# Patient Record
Sex: Female | Born: 1990 | Race: White | Hispanic: No | Marital: Single | Smoking: Never smoker
Health system: Southern US, Community
[De-identification: ages and names within clinical notes are randomized; demographics above are authoritative.]

## PROBLEM LIST (undated history)

## (undated) ENCOUNTER — Inpatient Hospital Stay (HOSPITAL_COMMUNITY): Payer: Self-pay

## (undated) DIAGNOSIS — N390 Urinary tract infection, site not specified: Secondary | ICD-10-CM

## (undated) DIAGNOSIS — J45909 Unspecified asthma, uncomplicated: Secondary | ICD-10-CM

## (undated) DIAGNOSIS — K602 Anal fissure, unspecified: Secondary | ICD-10-CM

## (undated) DIAGNOSIS — D649 Anemia, unspecified: Secondary | ICD-10-CM

## (undated) HISTORY — PX: TONSILLECTOMY: SUR1361

## (undated) HISTORY — DX: Anal fissure, unspecified: K60.2

---
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
## 2010-05-13 ENCOUNTER — Emergency Department (HOSPITAL_COMMUNITY)
Admission: EM | Admit: 2010-05-13 | Discharge: 2010-05-14 | Payer: Self-pay | Source: Home / Self Care | Admitting: Emergency Medicine

## 2010-08-18 LAB — URINALYSIS, ROUTINE W REFLEX MICROSCOPIC
Glucose, UA: NEGATIVE mg/dL
Leukocytes, UA: NEGATIVE
Protein, ur: NEGATIVE mg/dL
pH: 6 (ref 5.0–8.0)

## 2010-08-18 LAB — WET PREP, GENITAL

## 2010-08-18 LAB — URINE MICROSCOPIC-ADD ON

## 2010-08-18 LAB — ABO/RH: ABO/RH(D): A POS

## 2010-08-18 LAB — GC/CHLAMYDIA PROBE AMP, GENITAL: GC Probe Amp, Genital: NEGATIVE

## 2010-08-25 LAB — URINE MICROSCOPIC-ADD ON

## 2010-08-25 LAB — PREGNANCY, URINE: Preg Test, Ur: POSITIVE

## 2010-08-25 LAB — URINALYSIS, ROUTINE W REFLEX MICROSCOPIC
Glucose, UA: NEGATIVE mg/dL
Ketones, ur: NEGATIVE mg/dL
Nitrite: NEGATIVE
Specific Gravity, Urine: 1.022 (ref 1.005–1.030)
pH: 7 (ref 5.0–8.0)

## 2011-09-16 ENCOUNTER — Emergency Department (HOSPITAL_COMMUNITY): Payer: BC Managed Care – PPO

## 2011-09-16 ENCOUNTER — Encounter (HOSPITAL_COMMUNITY): Payer: Self-pay | Admitting: *Deleted

## 2011-09-16 ENCOUNTER — Emergency Department (HOSPITAL_COMMUNITY)
Admission: EM | Admit: 2011-09-16 | Discharge: 2011-09-17 | Disposition: A | Discharge status: Home / Self Care | Payer: BC Managed Care – PPO | Attending: Emergency Medicine | Admitting: Emergency Medicine

## 2011-09-16 DIAGNOSIS — N83209 Unspecified ovarian cyst, unspecified side: Secondary | ICD-10-CM | POA: Insufficient documentation

## 2011-09-16 DIAGNOSIS — R1033 Periumbilical pain: Secondary | ICD-10-CM | POA: Insufficient documentation

## 2011-09-16 LAB — CBC
HCT: 33.9 % — ABNORMAL LOW (ref 36.0–46.0)
Hemoglobin: 11.4 g/dL — ABNORMAL LOW (ref 12.0–15.0)
MCH: 29 pg (ref 26.0–34.0)
MCHC: 33.6 g/dL (ref 30.0–36.0)
MCV: 86.3 fL (ref 78.0–100.0)
Platelets: 181 10*3/uL (ref 150–400)
RBC: 3.93 MIL/uL (ref 3.87–5.11)
RDW: 12.7 % (ref 11.5–15.5)
WBC: 6.9 10*3/uL (ref 4.0–10.5)

## 2011-09-16 LAB — URINALYSIS, ROUTINE W REFLEX MICROSCOPIC
Bilirubin Urine: NEGATIVE
Glucose, UA: NEGATIVE mg/dL
Hgb urine dipstick: NEGATIVE
Ketones, ur: NEGATIVE mg/dL
Leukocytes, UA: NEGATIVE
Nitrite: NEGATIVE
Protein, ur: NEGATIVE mg/dL
Specific Gravity, Urine: 1.015 (ref 1.005–1.030)
Urobilinogen, UA: 0.2 mg/dL (ref 0.0–1.0)
pH: 6 (ref 5.0–8.0)

## 2011-09-16 LAB — COMPREHENSIVE METABOLIC PANEL
ALT: 8 U/L (ref 0–35)
AST: 13 U/L (ref 0–37)
Albumin: 3.9 g/dL (ref 3.5–5.2)
Alkaline Phosphatase: 49 U/L (ref 39–117)
BUN: 15 mg/dL (ref 6–23)
CO2: 27 mEq/L (ref 19–32)
Calcium: 9.1 mg/dL (ref 8.4–10.5)
Chloride: 101 mEq/L (ref 96–112)
Creatinine, Ser: 0.63 mg/dL (ref 0.50–1.10)
GFR calc Af Amer: >90 mL/min (ref >90)
GFR calc non Af Amer: >90 mL/min (ref >90)
Glucose, Bld: 85 mg/dL (ref 70–99)
Potassium: 3.7 mEq/L (ref 3.5–5.1)
Sodium: 138 mEq/L (ref 135–145)
Total Bilirubin: 0.3 mg/dL (ref 0.3–1.2)
Total Protein: 7 g/dL (ref 6.0–8.3)

## 2011-09-16 LAB — WET PREP, GENITAL
Trich, Wet Prep: NONE SEEN
Yeast Wet Prep HPF POC: NONE SEEN

## 2011-09-16 LAB — PREGNANCY, URINE: Preg Test, Ur: NEGATIVE

## 2011-09-16 MED ORDER — IOHEXOL 300 MG/ML  SOLN
100.0000 mL | Freq: Once | INTRAMUSCULAR | Status: AC | PRN
  Administered 2011-09-16: 100 mL via INTRAVENOUS

## 2011-09-16 MED ORDER — MORPHINE SULFATE 4 MG/ML IJ SOLN
4.0000 mg | Freq: Once | INTRAMUSCULAR | Status: AC
  Administered 2011-09-16: 4 mg via INTRAVENOUS
  Filled 2011-09-16: qty 1

## 2011-09-16 MED ORDER — MORPHINE SULFATE 4 MG/ML IJ SOLN: 4.0000 mg | Freq: Once | INTRAMUSCULAR | Status: DC

## 2011-09-16 MED ORDER — OXYCODONE-ACETAMINOPHEN 5-325 MG PO TABS: 1.0000 | ORAL_TABLET | ORAL | Status: AC | PRN

## 2011-09-16 MED ORDER — SODIUM CHLORIDE 0.9 % IV BOLUS (SEPSIS)
1000.0000 mL | Freq: Once | INTRAVENOUS | Status: AC
  Administered 2011-09-16: 1000 mL via INTRAVENOUS

## 2011-09-16 MED ORDER — METRONIDAZOLE 500 MG PO TABS: 500.0000 mg | ORAL_TABLET | Freq: Two times a day (BID) | ORAL | Status: AC

## 2011-09-16 MED ORDER — KETOROLAC TROMETHAMINE 15 MG/ML IJ SOLN
15.0000 mg | Freq: Once | INTRAMUSCULAR | Status: AC
  Administered 2011-09-16: 15 mg via INTRAVENOUS
  Filled 2011-09-16: qty 1

## 2011-09-16 MED ORDER — MORPHINE SULFATE 4 MG/ML IJ SOLN
INTRAMUSCULAR | Status: AC
  Administered 2011-09-16: 4 mg via INTRAVENOUS
  Filled 2011-09-16: qty 1

## 2011-09-16 MED ORDER — MORPHINE SULFATE 4 MG/ML IJ SOLN
4.0000 mg | Freq: Once | INTRAMUSCULAR | Status: AC
  Administered 2011-09-16: 4 mg via INTRAVENOUS

## 2011-09-16 NOTE — Discharge Instructions (Signed)
Abdominal Pain Abdominal pain can be caused by many things. Your caregiver decides the seriousness of your pain by an examination and possibly blood tests and X-rays. Many cases can be observed and treated at home. Most abdominal pain is not caused by a disease and will probably improve without treatment. However, in many cases, more time must pass before a clear cause of the pain can be found. Before that point, it may not be known if you need more testing, or if hospitalization or surgery is needed. HOME CARE INSTRUCTIONS   Do not take laxatives unless directed by your caregiver.   Take pain medicine only as directed by your caregiver.   Only take over-the-counter or prescription medicines for pain, discomfort, or fever as directed by your caregiver.   Try a clear liquid diet (broth, tea, or water) for as long as directed by your caregiver. Slowly move to a bland diet as tolerated.  SEEK IMMEDIATE MEDICAL CARE IF:   The pain does not go away.   You have a fever.   You keep throwing up (vomiting).   The pain is felt only in portions of the abdomen. Pain in the right side could possibly be appendicitis. In an adult, pain in the left lower portion of the abdomen could be colitis or diverticulitis.   You pass bloody or black tarry stools.  MAKE SURE YOU:   Understand these instructions.   Will watch your condition.   Will get help right away if you are not doing well or get worse.  Document Released: 03/03/2005 Document Revised: 05/13/2011 Document Reviewed: 01/10/2008 Exit[employer] Patient Information 2012 [employer]  RESOURCE GUIDE  Dental Problems  Patients with Medicaid: [facility]                     [facility] [address]                                           [address] Phone:  [phone]                                                  Phone:  [phone]  If unable to pay or uninsured, contact:  [facility] or [facility] to become qualified for the adult dental clinic.  Chronic Pain Problems Contact  [facility]  [phone] Patients need to be referred by their primary care doctor.  Insufficient Money for Medicine Contact [employer] Way:  call "211" or [facility] 271-5999.  No Primary Care Doctor Call [facility]  [phone] Other agencies that provide inexpensive medical care    [facility]  [phone]    [facility]  [phone]    [facility]  [phone]    Women's Clinic  [phone]    [facility]anned [facility]  [phone]    [facility]  [phone]  Psychological Services [facility]  [phone] [facility]  [phone] [facility]   800 853-5163 (emergency services 641-4993)  Substance Abuse Resources [facility]  336-882-2125 [facility] 336-784-9470 [facility] 336-285-9073 [facility] 336-845-3988 Residential & Outpatient Substance Abuse Program  800-659-3381    Abuse/Neglect [facility] Child Abuse Hotline (785)835-2234 [facility] Child Abuse Hotline 403-344-6948 (After Hours)  Emergency Shelter [facility] 218-213-5203  Maternity Homes [facility] 202 537 9764 [facility] (949)446-2209  MRSA Hotline #:   647-586-0334    [facility] Resources  [facility]     [employer]                          [facility] [address] [place]                       [address]      [address]  Blondell Reveal Phone:  [phone]                                   Phone:  (808) [phone]                 Phone:  661-044-5192  [facility] Phone:  724-120-7796  [facility] Child Abuse Hotline (838) 186-7035 (702)797-5253 (After  Hours)  Ovarian Cyst The ovaries are small organs that are on each side of the uterus. The ovaries are the organs that produce the female hormones, estrogen and progesterone. An ovarian cyst is a sac filled with fluid that can vary in its size. It is normal for a small cyst to form in women who are in the childbearing age and who have menstrual periods. This type of cyst is called a follicle cyst that becomes an ovulation cyst (corpus luteum cyst) after it produces the women's egg. It later goes away on its own if the woman does not become pregnant. There are other kinds of ovarian cysts that may cause problems and may need to be treated. The most serious problem is a cyst with cancer. It should be noted that menopausal women who have an ovarian cyst are at a higher risk of it being a cancer cyst. They should be evaluated very quickly, thoroughly and followed closely. This is especially true in menopausal women because of the high rate of ovarian cancer in women in menopause. CAUSES AND TYPES OF OVARIAN CYSTS:  FUNCTIONAL CYST: The follicle/corpus luteum cyst is a functional cyst that occurs every month during ovulation with the menstrual cycle. They go away with the next menstrual cycle if the woman does not get pregnant. Usually, there are no symptoms with a functional cyst.   ENDOMETRIOMA CYST: This cyst develops from the lining of the uterus tissue. This cyst gets in or on the ovary. It grows every month from the bleeding during the menstrual period. It is also called a "chocolate cyst" because it becomes filled with blood that turns brown. This cyst can cause pain in the lower abdomen during intercourse and with your menstrual period.   CYSTADENOMA CYST: This cyst develops from the cells  on the outside of the ovary. They usually are not cancerous. They can get very big and cause lower abdomen pain and pain with intercourse. This type of cyst can twist on itself, cut off its blood supply and cause  severe pain. It also can easily rupture and cause a lot of pain.   DERMOID CYST: This type of cyst is sometimes found in both ovaries. They are found to have different kinds of body tissue in the cyst. The tissue includes skin, teeth, hair, and/or cartilage. They usually do not have symptoms unless they get very big. Dermoid cysts are rarely cancerous.   POLYCYSTIC OVARY: This is a rare condition with hormone problems that produces many small cysts on both ovaries. The cysts are follicle-like cysts that never produce an egg and become a corpus luteum. It can cause an increase in body weight, infertility, acne, increase in body and facial hair and lack of menstrual periods or rare menstrual periods. Many women with this problem develop type 2 diabetes. The exact cause of this problem is unknown. A polycystic ovary is rarely cancerous.   THECA LUTEIN CYST: Occurs when too much hormone (human chorionic gonadotropin) is produced and over-stimulates the ovaries to produce an egg. They are frequently seen when doctors stimulate the ovaries for invitro-fertilization (test tube babies).   LUTEOMA CYST: This cyst is seen during pregnancy. Rarely it can cause an obstruction to the birth canal during labor and delivery. They usually go away after delivery.  SYMPTOMS   Pelvic pain or pressure.   Pain during sexual intercourse.   Increasing girth (swelling) of the abdomen.   Abnormal menstrual periods.   Increasing pain with menstrual periods.   You stop having menstrual periods and you are not pregnant.  DIAGNOSIS  The diagnosis can be made during:  Routine or annual pelvic examination (common).   Ultrasound.   X-ray of the pelvis.   CT Scan.   MRI.   Blood tests.  TREATMENT   Treatment may only be to follow the cyst monthly for 2 to 3 months with your caregiver. Many go away on their own, especially functional cysts.   May be aspirated (drained) with a long needle with ultrasound, or by  laparoscopy (inserting a tube into the pelvis through a small incision).   The whole cyst can be removed by laparoscopy.   Sometimes the cyst may need to be removed through an incision in the lower abdomen.   Hormone treatment is sometimes used to help dissolve certain cysts.   Birth control pills are sometimes used to help dissolve certain cysts.  HOME CARE INSTRUCTIONS  Follow your caregiver's advice regarding:  Medicine.   Follow up visits to evaluate and treat the cyst.   You may need to come back or make an appointment with another caregiver, to find the exact cause of your cyst, if your caregiver is not a gynecologist.   Get your yearly and recommended pelvic examinations and Pap tests.   Let your caregiver know if you have had an ovarian cyst in the past.  SEEK MEDICAL CARE IF:   Your periods are late, irregular, they stop, or are painful.   Your stomach (abdomen) or pelvic pain does not go away.   Your stomach becomes larger or swollen.   You have pressure on your bladder or trouble emptying your bladder completely.   You have painful sexual intercourse.   You have feelings of fullness, pressure, or discomfort in your stomach.   You  lose weight for no apparent reason.   You feel generally ill.   You become constipated.   You lose your appetite.   You develop acne.   You have an increase in body and facial hair.   You are gaining weight, without changing your exercise and eating habits.   You think you are pregnant.  SEEK IMMEDIATE MEDICAL CARE IF:   You have increasing abdominal pain.   You feel sick to your stomach (nausea) and/or vomit.   You develop a fever that comes on suddenly.   You develop abdominal pain during a bowel movement.   Your menstrual periods become heavier than usual.  Document Released: 05/24/2005 Document Revised: 05/13/2011 Document Reviewed: 03/27/2009 [facility] Patient Information 2012 [place], [place].

## 2011-09-16 NOTE — ED Provider Notes (Signed)
History    21 year old female with abdominal pain. periumbilical. Gradual onset about 2 days ago. Initially intermittent, now more or less constant. Initially noted when she came home from work. No hx of trauma. Pain is achy. Does not radiate. Worse with certain movements, particularly twisting her trunk. No fevers or chills. Nausea and vomiting x1 yesterday. Nonbilious, nonbloody emesis. No urinary complaints. No unusual vaginal bleeding or discharge. . No history of abdominal or pelvic surgery. No sick contacts. Does not think pregnant. No particular concern for possible STD.  CSN: [id number]  Arrival date & time 09/16/11  1505   First MD Initiated Contact with Patient 09/16/11 2007      Chief Complaint  Patient presents with  . Fever  . Abdominal Pain    (Consider location/radiation/quality/duration/timing/severity/associated sxs/prior treatment) HPI  History reviewed. No pertinent past medical history.  History reviewed. No pertinent past surgical history.  No family history on file.  History  Substance Use Topics  . Smoking status: Never Smoker   . Smokeless tobacco: Not on file  . Alcohol Use: Yes    OB History    Grav Para Term Preterm Abortions TAB SAB Ect Mult Living                  Review of Systems   Review of symptoms negative unless otherwise noted in HPI.   Allergies  Review of patient's allergies indicates no known allergies.  Home Medications   Current Outpatient Rx  Name Route Sig Dispense Refill  . IBUPROFEN 200 MG PO TABS Oral Take 400 mg by mouth every 6 (six) hours as needed. Pain.      BP 116/67  Pulse 103  Temp(Src) 98.1 F (36.7 C) (Oral)  Resp 20  Ht 5\' 4"  (1.626 m)  Wt 110 lb (49.896 kg)  BMI 18.88 kg/m2  SpO2 100%  LMP 08/25/2011  Physical Exam  Nursing note and vitals reviewed. Constitutional: She appears well-developed and well-nourished. No distress.       Sitting in bed. No acute distress.  HENT:  Head: Normocephalic  and atraumatic.  Eyes: Conjunctivae are normal. Pupils are equal, round, and reactive to light. Right eye exhibits no discharge. Left eye exhibits no discharge.  Neck: Neck supple.  Cardiovascular: Normal rate, regular rhythm and normal heart sounds.  Exam reveals no gallop and no friction rub.   No murmur heard. Pulmonary/Chest: Effort normal and breath sounds normal. No respiratory distress.  Abdominal: Soft. She exhibits no distension and no mass. There is tenderness. There is no rebound and no guarding.       Mild periumbilical tenderness without rebound or guarding.   Genitourinary:       No cva tenderness. External female genitalia. No bleeding or significant discharge noted. Cervix closed and no lesions noted. No cervical motion tenderness. Mild right-sided adnexal tenderness.  Musculoskeletal: She exhibits no edema and no tenderness.  Neurological: She is alert.  Skin: Skin is warm and dry. She is not diaphoretic.  Psychiatric: She has a normal mood and affect. Her behavior is normal. Thought content normal.    ED Course  Procedures (including critical care time)  Labs Reviewed  URINALYSIS, ROUTINE W REFLEX MICROSCOPIC - Abnormal; Notable for the following:    APPearance CLOUDY (*)    All other components within normal limits  CBC - Abnormal; Notable for the following:    Hemoglobin 11.4 (*)    HCT 33.9 (*)    All other components within normal limits  PREGNANCY, URINE  COMPREHENSIVE METABOLIC PANEL  GC/CHLAMYDIA PROBE AMP, GENITAL  WET PREP, GENITAL   Ct Abdomen Pelvis W Contrast  09/16/2011  *RADIOLOGY REPORT*  Clinical Data: Abdominal pain since yesterday.  CT ABDOMEN AND PELVIS WITH CONTRAST  Technique:  Multidetector CT imaging of the abdomen and pelvis was performed following the standard protocol during bolus administration of intravenous contrast.  Contrast: OMNIPAQUE IOHEXOL 300 MG/ML  SOLN  Comparison: None.  Findings: Moderate amount of dense fluid suggesting  blood within the pelvis with small amount of Morison's pouch and perihepatic region.  Abnormal appearance of the uterus with possible injury posterior wall such as perforation causing the moderate amount of surrounding hemorrhage.  Right adnexal complex septated cystic structure measures 9 x 4.8 x 5 cm will require follow-up.  Ruptured hemorrhagic cyst or adnexal process could conceivably cause the blood within the pelvis although primary uterine abnormality is of primary consideration.  The appendix is located posteriorly and directed superiorly and does not appear to be inflamed.  No abnormality involving the liver, spleen, pancreas, kidneys or adrenal glands.  No calcified gallstones.  No free intraperitoneal air.  Retroaortic left renal vein.  Urinary bladder appears intact.  IMPRESSION: Moderate amount of dense fluid suggesting blood within the pelvis with small amount of Morison's pouch and perihepatic region.  Abnormal appearance of the uterus with possible injury posterior wall such as perforation causing the moderate amount of surrounding hemorrhage.  Right adnexal complex septated cystic structure measures 9 x 4.8 x 5 cm will require follow-up.  Ruptured hemorrhagic cyst or adnexal process could conceivably cause the blood within the pelvis although primary uterine abnormality is of primary consideration.  Critical Value/emergent results were called by telephone at the time of interpretation on 09/16/2011  at 10/30  p.m.  to  Dr. Juleen China, who verbally acknowledged these results.  Original Report Authenticated By: Fuller Canada, M.D.     1. Abdominal pain   2. Ovarian cyst     10:50 PM Pt reassessed. No new complaints. Reports pain improved but still has some. No change in character or location. Updated on results including CT and need for pelvic exam.   MDM  20yF with abdominal pain. Suspect likely secondary to R adnexal cyst. Discussed with radiology the possibility of uterine rupture.  Considered but clinically doubt. Gradual onset of pain which initially intermittent. Not associated with intercourse or other potential trauma. No vaginal bleeding. No peritoneal signs on abdominal exam and not overly concerning pelvic exam.  Minimally anemic. Normotensive prior to start of IVF. Mild tachycardia but could be because of pain. Improved. Repeat BP noted, but not particularly concerning given pt's young age and no reflex tachycardia as would expect if from volume loss. Consider TOA given subjective fever, but less likely. No leukocytosis and afebrile. Only mild adnexal tenderness. Repeat abdominal exam prior to DC unchanged. Discussed case with Dr Vincente Poli, GYN. Close outpt f/u. Strict return precautions discussed. Clue cells on wet prep. Flagyl for BV.        Raeford Razor, MD 09/17/11 [phone]

## 2011-09-16 NOTE — ED Notes (Signed)
MD aware of bp prior to pain meds ordered.  OK to continue with meds.

## 2011-09-16 NOTE — ED Notes (Signed)
Pt states she started to have abdominal pain. Pt states she started to feel nauseated yesterday and had emesis x1 this morning .

## 2011-09-17 LAB — GC/CHLAMYDIA PROBE AMP, GENITAL
Chlamydia, DNA Probe: NEGATIVE
GC Probe Amp, Genital: NEGATIVE

## 2011-12-16 ENCOUNTER — Other Ambulatory Visit (HOSPITAL_COMMUNITY)
Admission: RE | Admit: 2011-12-16 | Discharge: 2011-12-16 | Disposition: A | Discharge status: Home / Self Care | Payer: BC Managed Care – PPO | Source: Ambulatory Visit | Attending: Obstetrics and Gynecology | Admitting: Obstetrics and Gynecology

## 2011-12-16 ENCOUNTER — Other Ambulatory Visit: Payer: Self-pay | Admitting: Adult Health

## 2011-12-16 DIAGNOSIS — Z01419 Encounter for gynecological examination (general) (routine) without abnormal findings: Secondary | ICD-10-CM | POA: Insufficient documentation

## 2011-12-16 DIAGNOSIS — Z113 Encounter for screening for infections with a predominantly sexual mode of transmission: Secondary | ICD-10-CM | POA: Insufficient documentation

## 2012-03-15 ENCOUNTER — Encounter (HOSPITAL_COMMUNITY): Payer: Self-pay

## 2012-03-15 ENCOUNTER — Emergency Department (HOSPITAL_COMMUNITY)
Admission: EM | Admit: 2012-03-15 | Discharge: 2012-03-15 | Disposition: A | Discharge status: Home / Self Care | Payer: BC Managed Care – PPO | Attending: Emergency Medicine | Admitting: Emergency Medicine

## 2012-03-15 DIAGNOSIS — N390 Urinary tract infection, site not specified: Secondary | ICD-10-CM | POA: Insufficient documentation

## 2012-03-15 DIAGNOSIS — J45909 Unspecified asthma, uncomplicated: Secondary | ICD-10-CM | POA: Insufficient documentation

## 2012-03-15 DIAGNOSIS — Z91018 Allergy to other foods: Secondary | ICD-10-CM | POA: Insufficient documentation

## 2012-03-15 HISTORY — DX: Unspecified asthma, uncomplicated: J45.909

## 2012-03-15 HISTORY — DX: Urinary tract infection, site not specified: N39.0

## 2012-03-15 LAB — URINALYSIS, ROUTINE W REFLEX MICROSCOPIC
Bilirubin Urine: NEGATIVE
Hgb urine dipstick: NEGATIVE
Ketones, ur: NEGATIVE mg/dL
Protein, ur: NEGATIVE mg/dL
Specific Gravity, Urine: 1.019 (ref 1.005–1.030)
Urobilinogen, UA: 0.2 mg/dL (ref 0.0–1.0)

## 2012-03-15 LAB — URINE MICROSCOPIC-ADD ON

## 2012-03-15 LAB — GLUCOSE, CAPILLARY: Glucose-Capillary: 113 mg/dL — ABNORMAL HIGH (ref 70–99)

## 2012-03-15 MED ORDER — SULFAMETHOXAZOLE-TRIMETHOPRIM 800-160 MG PO TABS: 1.0000 | ORAL_TABLET | Freq: Two times a day (BID) | ORAL | Status: DC

## 2012-03-15 NOTE — ED Provider Notes (Signed)
Medical screening examination/treatment/procedure(s) were performed by non-physician practitioner and as supervising physician I was immediately available for consultation/collaboration.   Shelda Jakes, MD 03/15/12 [phone]

## 2012-03-15 NOTE — ED Notes (Signed)
Pt presents with NAD- Denies N/V/D and fever- only urinary frequency

## 2012-03-15 NOTE — ED Provider Notes (Signed)
History     CSN: [id number]  Arrival date & time 03/15/12  1517   First MD Initiated Contact with Patient 03/15/12 1706      Chief Complaint  Patient presents with  . Urinary Frequency    (Consider location/radiation/quality/duration/timing/severity/associated sxs/prior treatment) Patient is a 21 y.o. female presenting with frequency.  Urinary Frequency This is a new problem. The current episode started yesterday. The problem occurs constantly. The problem has been gradually worsening. Associated symptoms include urinary symptoms. Pertinent negatives include no abdominal pain, anorexia, arthralgias, change in bowel habit, chest pain, chills, congestion, coughing, diaphoresis, fatigue, fever, headaches, joint swelling, myalgias, nausea, neck pain, numbness, rash, sore throat, swollen glands, vertigo, visual change, vomiting or weakness.    Past Medical History  Diagnosis Date  . Asthma   . UTI (lower urinary tract infection)     History reviewed. No pertinent past surgical history.  No family history on file.  History  Substance Use Topics  . Smoking status: Never Smoker   . Smokeless tobacco: Not on file  . Alcohol Use: Yes    OB History    Grav Para Term Preterm Abortions TAB SAB Ect Mult Living                  Review of Systems  Constitutional: Negative for fever, chills, diaphoresis and fatigue.  HENT: Negative for congestion, sore throat and neck pain.   Respiratory: Negative for cough.   Cardiovascular: Negative for chest pain.  Gastrointestinal: Negative for nausea, vomiting, abdominal pain, anorexia and change in bowel habit.  Genitourinary: Positive for frequency. Negative for dysuria, urgency, flank pain, decreased urine volume, vaginal bleeding, vaginal discharge, difficulty urinating and vaginal pain.  Musculoskeletal: Negative for myalgias, back pain, joint swelling and arthralgias.  Skin: Negative for rash.  Neurological: Negative for vertigo,  weakness, numbness and headaches.  All other systems reviewed and are negative.    Allergies  Red dye  Home Medications  No current outpatient prescriptions on file.  BP 98/52  Pulse 83  Temp 98.2 F (36.8 C) (Oral)  Resp 16  Ht 5\' 5"  (1.651 m)  Wt 120 lb (54.432 kg)  BMI 19.97 kg/m2  SpO2 100%  LMP 02/29/2012  Physical Exam  Nursing note and vitals reviewed. Constitutional: She is oriented to person, place, and time. She appears well-developed and well-nourished. No distress.  HENT:  Head: Normocephalic and atraumatic.  Eyes: Conjunctivae normal and EOM are normal.  Neck: Normal range of motion.  Pulmonary/Chest: Effort normal.  Abdominal:       Soft non tender abdomen  Genitourinary:       No flank pain  Musculoskeletal: Normal range of motion.  Neurological: She is alert and oriented to person, place, and time.  Skin: Skin is warm and dry. No rash noted. She is not diaphoretic.  Psychiatric: She has a normal mood and affect. Her behavior is normal.    ED Course  Procedures (including critical care time)  Labs Reviewed  URINALYSIS, ROUTINE W REFLEX MICROSCOPIC - Abnormal; Notable for the following:    Leukocytes, UA SMALL (*)     All other components within normal limits  GLUCOSE, CAPILLARY - Abnormal; Notable for the following:    Glucose-Capillary 113 (*)     All other components within normal limits  URINE MICROSCOPIC-ADD ON - Abnormal; Notable for the following:    Bacteria, UA FEW (*)     All other components within normal limits  PREGNANCY, URINE   No  results found.   No diagnosis found.    MDM  UTI  Pt has been diagnosed with a UTI. Pt is afebrile, no CVA tenderness, normotensive, and denies N/V. Pt to be dc home with antibiotics and instructions to follow up with PCP if symptoms persist.         Jaci Carrel, PA-C 03/15/12 1728

## 2012-06-08 ENCOUNTER — Emergency Department (HOSPITAL_COMMUNITY)
Admission: EM | Admit: 2012-06-08 | Discharge: 2012-06-08 | Disposition: A | Discharge status: Home / Self Care | Payer: BC Managed Care – PPO | Attending: Emergency Medicine | Admitting: Emergency Medicine

## 2012-06-08 ENCOUNTER — Encounter (HOSPITAL_COMMUNITY): Payer: Self-pay | Admitting: Cardiology

## 2012-06-08 DIAGNOSIS — Z87440 Personal history of urinary (tract) infections: Secondary | ICD-10-CM | POA: Insufficient documentation

## 2012-06-08 DIAGNOSIS — Y9389 Activity, other specified: Secondary | ICD-10-CM | POA: Insufficient documentation

## 2012-06-08 DIAGNOSIS — S139XXA Sprain of joints and ligaments of unspecified parts of neck, initial encounter: Secondary | ICD-10-CM | POA: Insufficient documentation

## 2012-06-08 DIAGNOSIS — Y9289 Other specified places as the place of occurrence of the external cause: Secondary | ICD-10-CM | POA: Insufficient documentation

## 2012-06-08 DIAGNOSIS — S298XXA Other specified injuries of thorax, initial encounter: Secondary | ICD-10-CM | POA: Insufficient documentation

## 2012-06-08 DIAGNOSIS — S0990XA Unspecified injury of head, initial encounter: Secondary | ICD-10-CM | POA: Insufficient documentation

## 2012-06-08 DIAGNOSIS — J45909 Unspecified asthma, uncomplicated: Secondary | ICD-10-CM | POA: Insufficient documentation

## 2012-06-08 DIAGNOSIS — S161XXA Strain of muscle, fascia and tendon at neck level, initial encounter: Secondary | ICD-10-CM

## 2012-06-08 MED ORDER — NAPROXEN 500 MG PO TABS: 500.0000 mg | ORAL_TABLET | Freq: Two times a day (BID) | ORAL | Status: DC
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
## 2012-06-08 NOTE — Progress Notes (Signed)
Orthopedic Tech Progress Note Patient Details:  Kimberly Fields 10-10-1990 [id number]  Ortho Devices Type of Ortho Device: Soft collar Ortho Device/Splint Location: neck Ortho Device/Splint Interventions: Application   Jennye Moccasin 06/08/2012, 7:08 PM
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
## 2012-06-09 NOTE — ED Provider Notes (Signed)
Medical screening examination/treatment/procedure(s) were performed by non-physician practitioner and as supervising physician I was immediately available for consultation/collaboration.   Dione Booze, MD 06/09/12 [phone]

## 2012-09-18 ENCOUNTER — Encounter: Payer: Self-pay | Admitting: *Deleted

## 2012-09-19 ENCOUNTER — Encounter: Payer: Self-pay | Admitting: Obstetrics & Gynecology

## 2012-09-19 ENCOUNTER — Ambulatory Visit (INDEPENDENT_AMBULATORY_CARE_PROVIDER_SITE_OTHER): Payer: BC Managed Care – PPO | Admitting: Obstetrics & Gynecology

## 2012-09-19 VITALS — BP 110/80 | Ht 65.0 in | Wt 137.0 lb

## 2012-09-19 DIAGNOSIS — Z1389 Encounter for screening for other disorder: Secondary | ICD-10-CM

## 2012-09-19 DIAGNOSIS — R350 Frequency of micturition: Secondary | ICD-10-CM

## 2012-09-19 LAB — POCT URINALYSIS DIPSTICK
Leukocytes, UA: NEGATIVE
Nitrite, UA: NEGATIVE
Protein, UA: NEGATIVE

## 2012-09-19 MED ORDER — NITROFURANTOIN MONOHYD MACRO 100 MG PO CAPS: 100.0000 mg | ORAL_CAPSULE | Freq: Two times a day (BID) | ORAL | Status: DC

## 2012-09-19 NOTE — Patient Instructions (Signed)

## 2012-09-19 NOTE — Progress Notes (Signed)
Patient ID: Kimberly Fields, female   DOB: 28-May-1991, 22 y.o.   MRN: [id number] Patient with 3 days of urinary frequency, feels like you have to go all the time.  No dysuria Urinalysis is negative. Has history of multiple episodes in past all of which reveal normal UAs and no cultures done  I am suspicious for Interstitial cystitis  Culture urine Macrobid 100 bid x 7 days Follow up in 10 days

## 2012-09-19 NOTE — Addendum Note (Signed)
Addended by: Criss Alvine on: 09/19/2012 11:24 AM   Modules accepted: Orders

## 2012-09-21 LAB — URINE CULTURE: Colony Count: 60000

## 2012-09-28 ENCOUNTER — Ambulatory Visit: Payer: BC Managed Care – PPO | Admitting: Obstetrics & Gynecology

## 2013-02-15 ENCOUNTER — Encounter (HOSPITAL_COMMUNITY): Payer: Self-pay | Admitting: Emergency Medicine

## 2013-02-15 ENCOUNTER — Emergency Department (HOSPITAL_COMMUNITY)
Admission: EM | Admit: 2013-02-15 | Discharge: 2013-02-15 | Disposition: A | Discharge status: Home / Self Care | Payer: BC Managed Care – PPO | Attending: Emergency Medicine | Admitting: Emergency Medicine

## 2013-02-15 DIAGNOSIS — R209 Unspecified disturbances of skin sensation: Secondary | ICD-10-CM | POA: Insufficient documentation

## 2013-02-15 DIAGNOSIS — J45909 Unspecified asthma, uncomplicated: Secondary | ICD-10-CM | POA: Insufficient documentation

## 2013-02-15 DIAGNOSIS — Z87440 Personal history of urinary (tract) infections: Secondary | ICD-10-CM | POA: Insufficient documentation

## 2013-02-15 DIAGNOSIS — B0089 Other herpesviral infection: Secondary | ICD-10-CM | POA: Insufficient documentation

## 2013-02-15 DIAGNOSIS — B001 Herpesviral vesicular dermatitis: Secondary | ICD-10-CM

## 2013-02-15 DIAGNOSIS — K1370 Unspecified lesions of oral mucosa: Secondary | ICD-10-CM | POA: Insufficient documentation

## 2013-02-15 MED ORDER — VALACYCLOVIR HCL 1 G PO TABS: 2000.0000 mg | ORAL_TABLET | Freq: Two times a day (BID) | ORAL | Status: AC

## 2013-02-15 NOTE — ED Provider Notes (Signed)
Medical screening examination/treatment/procedure(s) were performed by non-physician practitioner and as supervising physician I was immediately available for consultation/collaboration.    Celene Kras, MD 02/15/13 [phone]

## 2013-02-15 NOTE — ED Notes (Signed)
Patient to   ED with C/O her lip swelling.  Onset was yesterday.  Left upper lip noted to be swollen with crusting noted laterally.

## 2013-02-15 NOTE — ED Notes (Signed)
Pt states that she had same reaction to same chapstick before but not this bad upper lip swelling since yesterday got worse this am when she woke up. Pt having no diff breathing and is handling all secreations well w/o diff

## 2013-02-15 NOTE — ED Provider Notes (Signed)
CSN: [id number]     Arrival date & time 02/15/13  1253 History   First MD Initiated Contact with Patient 02/15/13 1508     Chief Complaint  Patient presents with  . Oral Swelling   (Consider location/radiation/quality/duration/timing/severity/associated sxs/prior Treatment) HPI Comments: Patient presents with complaint of left upper lip swelling since yesterday. Patient had a similar reaction a long time ago with application of chapstik. Patient noted tingling prior to the swelling. No tongue swelling, trouble breathing, rash. No history of anaphylaxis or allergic reaction. No treatments prior to arrival. Onset of symptoms gradual. Course is gradually worsening. Nothing makes symptoms better or worse.  The history is provided by the patient.    Past Medical History  Diagnosis Date  . Asthma   . UTI (lower urinary tract infection)    Past Surgical History  Procedure Laterality Date  . Tonsillectomy     Family History  Problem Relation Age of Onset  . Migraines Mother   . Asthma Mother   . Cancer Mother     cervical   . COPD Maternal Grandmother   . Cancer Maternal Grandmother     colon  . Anxiety disorder Maternal Grandmother   . Coronary artery disease Maternal Grandmother   . Cancer Maternal Grandfather     skin  . Coronary artery disease Maternal Grandfather   . Stroke Maternal Grandfather   . Tuberculosis Maternal Grandfather   . Heart attack Paternal Grandfather    History  Substance Use Topics  . Smoking status: Never Smoker   . Smokeless tobacco: Never Used  . Alcohol Use: No   OB History   Grav Para Term Preterm Abortions TAB SAB Ect Mult Living   3    3  2         Review of Systems  Constitutional: Negative for fever.  HENT: Positive for facial swelling (Left upper lip). Negative for sore throat and rhinorrhea.   Eyes: Negative for redness.  Respiratory: Negative for cough.   Cardiovascular: Negative for chest pain.  Gastrointestinal: Negative for  nausea, vomiting, abdominal pain and diarrhea.  Genitourinary: Negative for dysuria.  Musculoskeletal: Negative for myalgias.  Skin: Negative for rash.  Neurological: Negative for headaches.    Allergies  Red dye  Home Medications  No current outpatient prescriptions on file. BP 96/72  Pulse 73  Temp(Src) 98.1 F (36.7 C) (Oral)  Resp 16  SpO2 99% Physical Exam  Nursing note and vitals reviewed. Constitutional: She appears well-developed and well-nourished.  HENT:  Head: Normocephalic and atraumatic.  Mouth/Throat: Oral lesions (Left upper lip, vesicular lesions consistent with herpes simplex) present.  Eyes: Conjunctivae are normal.  Neck: Normal range of motion. Neck supple.  Pulmonary/Chest: No respiratory distress.  Neurological: She is alert.  Skin: Skin is warm and dry.  Psychiatric: She has a normal mood and affect.    ED Course  Procedures (including critical care time) Labs Review Labs Reviewed - No data to display Imaging Review No results found.  3:38 PM Patient seen and examined. Rx for Valtrex given. Pt counseled.   Vital signs reviewed and are as follows: Filed Vitals:   02/15/13 1255  BP: 96/72  Pulse: 73  Temp: 98.1 F (36.7 C)  Resp: 16      MDM   1. Herpes simplex labialis    Patient with oral HSV infection. No contraindications to anti-viral.     Renne Crigler, PA-C 02/15/13 1544

## 2013-02-15 NOTE — ED Notes (Signed)
Pt moved to acute bed per request of Tiffany, PA.

## 2013-02-15 NOTE — ED Notes (Signed)
Upper lip swelling since using Blistex lip balm. States has had this reaction to it before. Upper lip swollen with chancre-type sore on upper left.

## 2014-02-20 ENCOUNTER — Ambulatory Visit (INDEPENDENT_AMBULATORY_CARE_PROVIDER_SITE_OTHER): Payer: BC Managed Care – PPO | Admitting: Adult Health

## 2014-02-20 ENCOUNTER — Encounter: Payer: Self-pay | Admitting: Adult Health

## 2014-02-20 DIAGNOSIS — Z3201 Encounter for pregnancy test, result positive: Secondary | ICD-10-CM

## 2014-02-20 LAB — POCT URINE PREGNANCY: PREG TEST UR: POSITIVE

## 2014-02-20 NOTE — Progress Notes (Signed)
Pt here for pregnancy test. Positive result. Pt has had some stomach pain, but has a BM and pain goes away. Pt reports no spotting. Advised spotting and cramping can be normal in early pregnancy, that's just everything getting settled into place. Advised if she notices either, push fluids, take it easy, and call office. Pt voiced understanding. JSY

## 2014-03-04 ENCOUNTER — Other Ambulatory Visit: Payer: Self-pay | Admitting: Obstetrics and Gynecology

## 2014-03-04 DIAGNOSIS — O3680X0 Pregnancy with inconclusive fetal viability, not applicable or unspecified: Secondary | ICD-10-CM

## 2014-03-07 ENCOUNTER — Other Ambulatory Visit: Payer: Self-pay | Admitting: Obstetrics and Gynecology

## 2014-03-07 ENCOUNTER — Ambulatory Visit (INDEPENDENT_AMBULATORY_CARE_PROVIDER_SITE_OTHER): Payer: BC Managed Care – PPO

## 2014-03-07 DIAGNOSIS — O2621 Pregnancy care for patient with recurrent pregnancy loss, first trimester: Secondary | ICD-10-CM

## 2014-03-07 DIAGNOSIS — O3680X0 Pregnancy with inconclusive fetal viability, not applicable or unspecified: Secondary | ICD-10-CM

## 2014-03-07 NOTE — Progress Notes (Signed)
U/S(7+1wks)-single IUP with +FCA noted,FHR-154 bpm, cx appears closed, bilateral adnexa appears WNL with C.L. Noted on Rt, CRL c/w LMP dates, no free fluid noted within the pelvis

## 2014-03-18 ENCOUNTER — Encounter: Payer: BC Managed Care – PPO | Admitting: Women's Health

## 2014-04-08 ENCOUNTER — Encounter: Payer: Self-pay | Admitting: Women's Health

## 2014-04-08 ENCOUNTER — Ambulatory Visit (INDEPENDENT_AMBULATORY_CARE_PROVIDER_SITE_OTHER): Payer: BC Managed Care – PPO | Admitting: Women's Health

## 2014-04-08 ENCOUNTER — Other Ambulatory Visit (HOSPITAL_COMMUNITY)
Admission: RE | Admit: 2014-04-08 | Discharge: 2014-04-08 | Disposition: A | Discharge status: Home / Self Care | Payer: BC Managed Care – PPO | Source: Ambulatory Visit | Attending: Obstetrics & Gynecology | Admitting: Obstetrics & Gynecology

## 2014-04-08 VITALS — BP 108/56 | Wt 125.0 lb

## 2014-04-08 DIAGNOSIS — Z113 Encounter for screening for infections with a predominantly sexual mode of transmission: Secondary | ICD-10-CM | POA: Insufficient documentation

## 2014-04-08 DIAGNOSIS — Z0184 Encounter for antibody response examination: Secondary | ICD-10-CM

## 2014-04-08 DIAGNOSIS — Z0283 Encounter for blood-alcohol and blood-drug test: Secondary | ICD-10-CM

## 2014-04-08 DIAGNOSIS — Z3490 Encounter for supervision of normal pregnancy, unspecified, unspecified trimester: Secondary | ICD-10-CM | POA: Insufficient documentation

## 2014-04-08 DIAGNOSIS — Z114 Encounter for screening for human immunodeficiency virus [HIV]: Secondary | ICD-10-CM

## 2014-04-08 DIAGNOSIS — Z01419 Encounter for gynecological examination (general) (routine) without abnormal findings: Secondary | ICD-10-CM | POA: Diagnosis not present

## 2014-04-08 DIAGNOSIS — Z3682 Encounter for antenatal screening for nuchal translucency: Secondary | ICD-10-CM

## 2014-04-08 DIAGNOSIS — Z331 Pregnant state, incidental: Secondary | ICD-10-CM

## 2014-04-08 DIAGNOSIS — Z124 Encounter for screening for malignant neoplasm of cervix: Secondary | ICD-10-CM

## 2014-04-08 DIAGNOSIS — Z1389 Encounter for screening for other disorder: Secondary | ICD-10-CM

## 2014-04-08 DIAGNOSIS — Z3401 Encounter for supervision of normal first pregnancy, first trimester: Secondary | ICD-10-CM

## 2014-04-08 DIAGNOSIS — Z3491 Encounter for supervision of normal pregnancy, unspecified, first trimester: Secondary | ICD-10-CM

## 2014-04-08 LAB — POCT URINALYSIS DIPSTICK
Glucose, UA: NEGATIVE
Ketones, UA: NEGATIVE
LEUKOCYTES UA: NEGATIVE
NITRITE UA: NEGATIVE
PROTEIN UA: NEGATIVE
RBC UA: NEGATIVE

## 2014-04-08 NOTE — Patient Instructions (Signed)

## 2014-04-08 NOTE — Progress Notes (Signed)
  Subjective:  Kimberly Fields is a 23 y.o. 234P0030 Caucasian female at 8319w5d by LMP c/w 6wk u/s, being seen today for her first obstetrical visit.  Her obstetrical history is significant for TAB x 1, SAB x 2.  Pregnancy history fully reviewed.  Patient reports no complaints. Denies vb, cramping, uti s/s, abnormal/malodorous vag d/c, or vulvovaginal itching/irritation.  BP 108/56 mmHg  Wt 125 lb (56.7 kg)  LMP 01/16/2014  HISTORY: OB History  Gravida Para Term Preterm AB SAB TAB Ectopic Multiple Living  4 0 0 0 3 2 1 0 0 0     # Outcome Date GA Lbr Len/2nd Weight Sex Delivery Anes PTL Lv  4 Current           3 SAB 2013 9619w0d         2 SAB 2012          1 TAB 2011             Past Medical History  Diagnosis Date  . Asthma   . UTI (lower urinary tract infection)    Past Surgical History  Procedure Laterality Date  . Tonsillectomy     Family History  Problem Relation Age of Onset  . Migraines Mother   . Asthma Mother   . Cancer Mother     cervical   . COPD Maternal Grandmother   . Cancer Maternal Grandmother     colon  . Anxiety disorder Maternal Grandmother   . Coronary artery disease Maternal Grandmother   . Cancer Maternal Grandfather     skin  . Coronary artery disease Maternal Grandfather   . Stroke Maternal Grandfather   . Tuberculosis Maternal Grandfather   . Heart attack Paternal Grandfather     Exam   System:     General: Well developed & nourished, no acute distress   Skin: Warm & dry, normal coloration and turgor, no rashes   Neurologic: Alert & oriented, normal mood   Cardiovascular: Regular rate & rhythm   Respiratory: Effort & rate normal, LCTAB, acyanotic   Abdomen: Soft, non tender   Extremities: normal strength, tone   Pelvic Exam:    Perineum: Normal perineum   Vulva: Normal, no lesions   Vagina:  Normal mucosa, normal discharge   Cervix: Normal, bulbous, appears closed   Uterus: Normal size/shape/contour for GA   Thin prep pap smear  obtained w/ reflex high risk HPV cotesting FHR: 170 via u/s   Assessment:   Pregnancy: G4P0030 Patient Active Problem List   Diagnosis Date Noted  . Supervision of normal pregnancy 04/08/2014    Priority: High    7919w5d G4P0030 New OB visit H/O 2 SABs   Plan:  Initial labs drawn Continue prenatal vitamins Problem list reviewed and updated Reviewed n/v relief measures and warning s/s to report Reviewed recommended weight gain based on pre-gravid BMI Encouraged well-balanced diet Genetic Screening discussed Integrated Screen: requested Cystic fibrosis screening discussed declined Ultrasound discussed; fetal survey: requested Follow up in 1 weeks for 1st it/nt (no visit) then 4wks for visit and 2nd IT CCNC completed  Marge DuncansBooker, Lamine Laton Randall CNM, [facility]WHNP-BC 04/08/2014 3:01 PM

## 2014-04-09 LAB — CBC
HCT: 33 % — ABNORMAL LOW (ref 36.0–46.0)
Hemoglobin: 11.4 g/dL — ABNORMAL LOW (ref 12.0–15.0)
MCH: 29.1 pg (ref 26.0–34.0)
MCHC: 34.5 g/dL (ref 30.0–36.0)
MCV: 84.2 fL (ref 78.0–100.0)
PLATELETS: 239 10*3/uL (ref 150–400)
RBC: 3.92 MIL/uL (ref 3.87–5.11)
RDW: 13.8 % (ref 11.5–15.5)
WBC: 7.4 10*3/uL (ref 4.0–10.5)

## 2014-04-09 LAB — ANTIBODY SCREEN: Antibody Screen: NEGATIVE

## 2014-04-09 LAB — DRUG SCREEN, URINE, NO CONFIRMATION
AMPHETAMINE SCRN UR: NEGATIVE
BENZODIAZEPINES.: NEGATIVE
Barbiturate Quant, Ur: NEGATIVE
COCAINE METABOLITES: NEGATIVE
Creatinine,U: 150.5 mg/dL
MARIJUANA METABOLITE: NEGATIVE
Methadone: NEGATIVE
OPIATE SCREEN, URINE: NEGATIVE
PHENCYCLIDINE (PCP): NEGATIVE
PROPOXYPHENE: NEGATIVE

## 2014-04-09 LAB — URINALYSIS, ROUTINE W REFLEX MICROSCOPIC
BILIRUBIN URINE: NEGATIVE
GLUCOSE, UA: NEGATIVE mg/dL
Hgb urine dipstick: NEGATIVE
Ketones, ur: NEGATIVE mg/dL
LEUKOCYTES UA: NEGATIVE
Nitrite: NEGATIVE
PROTEIN: NEGATIVE mg/dL
SPECIFIC GRAVITY, URINE: 1.022 (ref 1.005–1.030)
Urobilinogen, UA: 0.2 mg/dL (ref 0.0–1.0)
pH: 6.5 (ref 5.0–8.0)

## 2014-04-09 LAB — RPR

## 2014-04-09 LAB — OXYCODONE SCREEN, UA, RFLX CONFIRM: Oxycodone Screen, Ur: NEGATIVE ng/mL

## 2014-04-09 LAB — ABO AND RH: Rh Type: POSITIVE

## 2014-04-09 LAB — RUBELLA SCREEN: Rubella: 1.14 Index — ABNORMAL HIGH (ref <0.90)

## 2014-04-09 LAB — HIV ANTIBODY (ROUTINE TESTING W REFLEX): HIV 1&2 Ab, 4th Generation: NONREACTIVE

## 2014-04-09 LAB — HEPATITIS B SURFACE ANTIGEN: Hepatitis B Surface Ag: NEGATIVE

## 2014-04-09 LAB — VARICELLA ZOSTER ANTIBODY, IGG: VARICELLA IGG: 899.3 {index} — AB (ref <135.00)

## 2014-04-10 LAB — URINE CULTURE
COLONY COUNT: NO GROWTH
ORGANISM ID, BACTERIA: NO GROWTH

## 2014-04-10 LAB — CYTOLOGY - PAP

## 2014-04-15 ENCOUNTER — Other Ambulatory Visit: Payer: BC Managed Care – PPO

## 2014-04-23 ENCOUNTER — Other Ambulatory Visit: Payer: BC Managed Care – PPO

## 2014-04-23 ENCOUNTER — Ambulatory Visit (INDEPENDENT_AMBULATORY_CARE_PROVIDER_SITE_OTHER): Payer: BC Managed Care – PPO

## 2014-04-23 DIAGNOSIS — Z3682 Encounter for antenatal screening for nuchal translucency: Secondary | ICD-10-CM

## 2014-04-23 DIAGNOSIS — Z331 Pregnant state, incidental: Secondary | ICD-10-CM

## 2014-04-23 DIAGNOSIS — Z36 Encounter for antenatal screening of mother: Secondary | ICD-10-CM

## 2014-04-23 DIAGNOSIS — O09291 Supervision of pregnancy with other poor reproductive or obstetric history, first trimester: Secondary | ICD-10-CM

## 2014-04-23 NOTE — Progress Notes (Signed)
U/S(13+6wks)-active fetus, CRL c/w dates, cx appears closed (3.3cm), bilateral adnexa appears WNL, FHR-152 bpm, NB present, NT-1.6791mm, anterior Gr 0 placenta,  *Of note- ?LLE clubbed foot? Will reck at anatomy screen

## 2014-04-30 LAB — MATERNAL SCREEN, INTEGRATED #1

## 2014-05-06 ENCOUNTER — Encounter: Payer: BC Managed Care – PPO | Admitting: Women's Health

## 2014-05-10 ENCOUNTER — Ambulatory Visit (INDEPENDENT_AMBULATORY_CARE_PROVIDER_SITE_OTHER): Payer: BC Managed Care – PPO | Admitting: Adult Health

## 2014-05-10 ENCOUNTER — Encounter: Payer: Self-pay | Admitting: Adult Health

## 2014-05-10 VITALS — BP 90/60 | Wt 126.0 lb

## 2014-05-10 DIAGNOSIS — Z3682 Encounter for antenatal screening for nuchal translucency: Secondary | ICD-10-CM

## 2014-05-10 DIAGNOSIS — Z331 Pregnant state, incidental: Secondary | ICD-10-CM

## 2014-05-10 DIAGNOSIS — Z3492 Encounter for supervision of normal pregnancy, unspecified, second trimester: Secondary | ICD-10-CM

## 2014-05-10 DIAGNOSIS — Z3482 Encounter for supervision of other normal pregnancy, second trimester: Secondary | ICD-10-CM

## 2014-05-10 DIAGNOSIS — Z1389 Encounter for screening for other disorder: Secondary | ICD-10-CM

## 2014-05-10 LAB — POCT URINALYSIS DIPSTICK
Blood, UA: NEGATIVE
Glucose, UA: NEGATIVE
Ketones, UA: NEGATIVE
Leukocytes, UA: NEGATIVE
NITRITE UA: NEGATIVE
PROTEIN UA: NEGATIVE

## 2014-05-10 NOTE — Patient Instructions (Signed)
Second Trimester of Pregnancy The second trimester is from week 13 through week 28, months 4 through 6. The second trimester is often a time when you feel your best. Your body has also adjusted to being pregnant, and you begin to feel better physically. Usually, morning sickness has lessened or quit completely, you may have more energy, and you may have an increase in appetite. The second trimester is also a time when the fetus is growing rapidly. At the end of the sixth month, the fetus is about 9 inches long and weighs about 1 pounds. You will likely begin to feel the baby move (quickening) between 18 and 20 weeks of the pregnancy. BODY CHANGES Your body goes through many changes during pregnancy. The changes vary from woman to woman.   Your weight will continue to increase. You will notice your lower abdomen bulging out.  You may begin to get stretch marks on your hips, abdomen, and breasts.  You may develop headaches that can be relieved by medicines approved by your health care provider.  You may urinate more often because the fetus is pressing on your bladder.  You may develop or continue to have heartburn as a result of your pregnancy.  You may develop constipation because certain hormones are causing the muscles that push waste through your intestines to slow down.  You may develop hemorrhoids or swollen, bulging veins (varicose veins).  You may have back pain because of the weight gain and pregnancy hormones relaxing your joints between the bones in your pelvis and as a result of a shift in weight and the muscles that support your balance.  Your breasts will continue to grow and be tender.  Your gums may bleed and may be sensitive to brushing and flossing.  Dark spots or blotches (chloasma, mask of pregnancy) may develop on your face. This will likely fade after the baby is born.  A dark line from your belly button to the pubic area (linea nigra) may appear. This will likely fade  after the baby is born.  You may have changes in your hair. These can include thickening of your hair, rapid growth, and changes in texture. Some women also have hair loss during or after pregnancy, or hair that feels dry or thin. Your hair will most likely return to normal after your baby is born. WHAT TO EXPECT AT YOUR PRENATAL VISITS During a routine prenatal visit:  You will be weighed to make sure you and the fetus are growing normally.  Your blood pressure will be taken.  Your abdomen will be measured to track your baby's growth.  The fetal heartbeat will be listened to.  Any test results from the previous visit will be discussed. Your health care provider may ask you:  How you are feeling.  If you are feeling the baby move.  If you have had any abnormal symptoms, such as leaking fluid, bleeding, severe headaches, or abdominal cramping.  If you have any questions. Other tests that may be performed during your second trimester include:  Blood tests that check for:  Low iron levels (anemia).  Gestational diabetes (between 24 and 28 weeks).  Rh antibodies.  Urine tests to check for infections, diabetes, or protein in the urine.  An ultrasound to confirm the proper growth and development of the baby.  An amniocentesis to check for possible genetic problems.  Fetal screens for spina bifida and Down syndrome. HOME CARE INSTRUCTIONS   Avoid all smoking, herbs, alcohol, and unprescribed   drugs. These chemicals affect the formation and growth of the baby.  Follow your health care provider's instructions regarding medicine use. There are medicines that are either safe or unsafe to take during pregnancy.  Exercise only as directed by your health care provider. Experiencing uterine cramps is a good sign to stop exercising.  Continue to eat regular, healthy meals.  Wear a good support bra for breast tenderness.  Do not use hot tubs, steam rooms, or saunas.  Wear your  seat belt at all times when driving.  Avoid raw meat, uncooked cheese, cat litter boxes, and soil used by cats. These carry germs that can cause birth defects in the baby.  Take your prenatal vitamins.  Try taking a stool softener (if your health care provider approves) if you develop constipation. Eat more high-fiber foods, such as fresh vegetables or fruit and whole grains. Drink plenty of fluids to keep your urine clear or pale yellow.  Take warm sitz baths to soothe any pain or discomfort caused by hemorrhoids. Use hemorrhoid cream if your health care provider approves.  If you develop varicose veins, wear support hose. Elevate your feet for 15 minutes, 3-4 times a day. Limit salt in your diet.  Avoid heavy lifting, wear low heel shoes, and practice good posture.  Rest with your legs elevated if you have leg cramps or low back pain.  Visit your dentist if you have not gone yet during your pregnancy. Use a soft toothbrush to brush your teeth and be gentle when you floss.  A sexual relationship may be continued unless your health care provider directs you otherwise.  Continue to go to all your prenatal visits as directed by your health care provider. SEEK MEDICAL CARE IF:   You have dizziness.  You have mild pelvic cramps, pelvic pressure, or nagging pain in the abdominal area.  You have persistent nausea, vomiting, or diarrhea.  You have a bad smelling vaginal discharge.  You have pain with urination. SEEK IMMEDIATE MEDICAL CARE IF:   You have a fever.  You are leaking fluid from your vagina.  You have spotting or bleeding from your vagina.  You have severe abdominal cramping or pain.  You have rapid weight gain or loss.  You have shortness of breath with chest pain.  You notice sudden or extreme swelling of your face, hands, ankles, feet, or legs.  You have not felt your baby move in over an hour.  You have severe headaches that do not go away with  medicine.  You have vision changes. Document Released: 05/18/2001 Document Revised: 05/29/2013 Document Reviewed: 07/25/2012 [facility] Patient Information 2015 [place], [place]. This information is not intended to replace advice given to you by your health care provider. Make sure you discuss any questions you have with your health care provider. Return in 4 weeks for US and see kim

## 2014-05-10 NOTE — Progress Notes (Signed)
[id number] 6651w2d Estimated Date of Delivery: 10/23/14  Blood pressure 90/60, weight 126 lb (57.153 kg), last menstrual period 01/16/2014.   BP weight and urine results all reviewed and noted.  Please refer to the obstetrical flow sheet for the fundal height and fetal heart rate documentation:FHR 152 via doppler  Patient  denies any bleeding and no rupture of membranes symptoms or regular contractions. Patient is without complaints. All questions were answered. Second IT today Plan:  Continued routine obstetrical care,   Follow up in 4  weeks for OB appointment, and anatomy  US and see KIM

## 2014-05-14 LAB — MATERNAL SCREEN, INTEGRATED #2
AFP MOM MAT SCREEN: 1.06
AFP, SERUM MAT SCREEN: 40.4 ng/mL
Age risk Down Syndrome: 1:1100 {titer}
CALCULATED GESTATIONAL AGE MAT SCREEN: 16.3
Crown Rump Length: 82.4 mm
Estriol Mom: 0.97
Estriol, Free: 0.93 ng/mL
HCG, MOM MAT SCREEN: 0.57
HCG, SERUM MAT SCREEN: 21 [IU]/mL
Inhibin A Dimeric: 170 pg/mL
Inhibin A MoM: 0.92
MSS Down Syndrome: <1:5000 {titer}
MSS Trisomy 18 Risk: <1:5000 {titer}
NT MOM MAT SCREEN: 1.06
Nuchal Translucency: 1.91 mm
Number of fetuses: 1
PAPP-A MOM MAT SCREEN: 2.05
PAPP-A: 2417 ng/mL
Rish for ONTD: <1:5000 {titer}

## 2014-06-05 ENCOUNTER — Other Ambulatory Visit: Payer: Self-pay | Admitting: Adult Health

## 2014-06-05 DIAGNOSIS — O2622 Pregnancy care for patient with recurrent pregnancy loss, second trimester: Secondary | ICD-10-CM

## 2014-06-05 DIAGNOSIS — O09292 Supervision of pregnancy with other poor reproductive or obstetric history, second trimester: Secondary | ICD-10-CM

## 2014-06-05 DIAGNOSIS — Z3689 Encounter for other specified antenatal screening: Secondary | ICD-10-CM

## 2014-06-07 NOTE — L&D Delivery Note (Signed)
Delivery Note At 6:41 PM a viable female was delivered via Vaginal, Spontaneous Delivery   APGAR: 9, 9; weight  .   Placenta status: Intact Meconium stainedCord:  3 vessels with the following complications: None.   Anesthesia: Epidural  Episiotomy: None Lacerations: 1st degree;Vaginal Suture Repair: 2.0 vicryl Est. Blood Loss (mL):    Mom to postpartum.  Baby to Couplet care / Skin to Skin.  Leisha Trinkle Grissett 10/12/2014, 7:06 PM

## 2014-06-10 ENCOUNTER — Ambulatory Visit (INDEPENDENT_AMBULATORY_CARE_PROVIDER_SITE_OTHER): Payer: BLUE CROSS/BLUE SHIELD

## 2014-06-10 ENCOUNTER — Encounter: Payer: Self-pay | Admitting: Women's Health

## 2014-06-10 ENCOUNTER — Ambulatory Visit (INDEPENDENT_AMBULATORY_CARE_PROVIDER_SITE_OTHER): Payer: BC Managed Care – PPO | Admitting: Women's Health

## 2014-06-10 VITALS — BP 104/56 | Wt 134.0 lb

## 2014-06-10 DIAGNOSIS — Z3689 Encounter for other specified antenatal screening: Secondary | ICD-10-CM

## 2014-06-10 DIAGNOSIS — Z331 Pregnant state, incidental: Secondary | ICD-10-CM

## 2014-06-10 DIAGNOSIS — Z1389 Encounter for screening for other disorder: Secondary | ICD-10-CM

## 2014-06-10 DIAGNOSIS — O2622 Pregnancy care for patient with recurrent pregnancy loss, second trimester: Secondary | ICD-10-CM

## 2014-06-10 DIAGNOSIS — Z36 Encounter for antenatal screening of mother: Secondary | ICD-10-CM

## 2014-06-10 DIAGNOSIS — Z3492 Encounter for supervision of normal pregnancy, unspecified, second trimester: Secondary | ICD-10-CM

## 2014-06-10 DIAGNOSIS — O09292 Supervision of pregnancy with other poor reproductive or obstetric history, second trimester: Secondary | ICD-10-CM

## 2014-06-10 LAB — POCT URINALYSIS DIPSTICK
Blood, UA: NEGATIVE
Glucose, UA: NEGATIVE
Ketones, UA: NEGATIVE
Leukocytes, UA: NEGATIVE
Nitrite, UA: NEGATIVE
Protein, UA: NEGATIVE

## 2014-06-10 NOTE — Progress Notes (Signed)
U/S(20+5wks)-breech active fetus, meas c/w dates, fluid WNL, anterior Gr 0 placenta, cx appears closed (3.5cm), bilateral adnexa appears WNL, FHR-143 bpm, female fetus, no major abnl noted anatomy screen completed today

## 2014-06-10 NOTE — Progress Notes (Signed)
Low-risk OB appointment G4P0030 [redacted]w[redacted]d Estimated Date of Delivery: 10/23/14 BP 104/56 mmHg  Wt 134 lb (60.782 kg)  LMP 01/16/2014  BP, weight, and urine reviewed.  Refer to obstetrical flow sheet for FH & FHR.  Reports good fm.  Denies regular uc's, lof, vb, or uti s/s. No complaints. Reviewed today's normal anatomy u/s, ptl s/s. Plan:  Continue routine obstetrical care  F/U in 4wks for OB appointment

## 2014-06-10 NOTE — Patient Instructions (Signed)
Second Trimester of Pregnancy The second trimester is from week 13 through week 28, months 4 through 6. The second trimester is often a time when you feel your best. Your body has also adjusted to being pregnant, and you begin to feel better physically. Usually, morning sickness has lessened or quit completely, you may have more energy, and you may have an increase in appetite. The second trimester is also a time when the fetus is growing rapidly. At the end of the sixth month, the fetus is about 9 inches long and weighs about 1 pounds. You will likely begin to feel the baby move (quickening) between 18 and 20 weeks of the pregnancy. BODY CHANGES Your body goes through many changes during pregnancy. The changes vary from woman to woman.   Your weight will continue to increase. You will notice your lower abdomen bulging out.  You may begin to get stretch marks on your hips, abdomen, and breasts.  You may develop headaches that can be relieved by medicines approved by your health care provider.  You may urinate more often because the fetus is pressing on your bladder.  You may develop or continue to have heartburn as a result of your pregnancy.  You may develop constipation because certain hormones are causing the muscles that push waste through your intestines to slow down.  You may develop hemorrhoids or swollen, bulging veins (varicose veins).  You may have back pain because of the weight gain and pregnancy hormones relaxing your joints between the bones in your pelvis and as a result of a shift in weight and the muscles that support your balance.  Your breasts will continue to grow and be tender.  Your gums may bleed and may be sensitive to brushing and flossing.  Dark spots or blotches (chloasma, mask of pregnancy) may develop on your face. This will likely fade after the baby is born.  A dark line from your belly button to the pubic area (linea nigra) may appear. This will likely fade  after the baby is born.  You may have changes in your hair. These can include thickening of your hair, rapid growth, and changes in texture. Some women also have hair loss during or after pregnancy, or hair that feels dry or thin. Your hair will most likely return to normal after your baby is born. WHAT TO EXPECT AT YOUR PRENATAL VISITS During a routine prenatal visit:  You will be weighed to make sure you and the fetus are growing normally.  Your blood pressure will be taken.  Your abdomen will be measured to track your baby's growth.  The fetal heartbeat will be listened to.  Any test results from the previous visit will be discussed. Your health care provider may ask you:  How you are feeling.  If you are feeling the baby move.  If you have had any abnormal symptoms, such as leaking fluid, bleeding, severe headaches, or abdominal cramping.  If you have any questions. Other tests that may be performed during your second trimester include:  Blood tests that check for:  Low iron levels (anemia).  Gestational diabetes (between 24 and 28 weeks).  Rh antibodies.  Urine tests to check for infections, diabetes, or protein in the urine.  An ultrasound to confirm the proper growth and development of the baby.  An amniocentesis to check for possible genetic problems.  Fetal screens for spina bifida and Down syndrome. HOME CARE INSTRUCTIONS   Avoid all smoking, herbs, alcohol, and unprescribed   drugs. These chemicals affect the formation and growth of the baby.  Follow your health care provider's instructions regarding medicine use. There are medicines that are either safe or unsafe to take during pregnancy.  Exercise only as directed by your health care provider. Experiencing uterine cramps is a good sign to stop exercising.  Continue to eat regular, healthy meals.  Wear a good support bra for breast tenderness.  Do not use hot tubs, steam rooms, or saunas.  Wear your  seat belt at all times when driving.  Avoid raw meat, uncooked cheese, cat litter boxes, and soil used by cats. These carry germs that can cause birth defects in the baby.  Take your prenatal vitamins.  Try taking a stool softener (if your health care provider approves) if you develop constipation. Eat more high-fiber foods, such as fresh vegetables or fruit and whole grains. Drink plenty of fluids to keep your urine clear or pale yellow.  Take warm sitz baths to soothe any pain or discomfort caused by hemorrhoids. Use hemorrhoid cream if your health care provider approves.  If you develop varicose veins, wear support hose. Elevate your feet for 15 minutes, 3-4 times a day. Limit salt in your diet.  Avoid heavy lifting, wear low heel shoes, and practice good posture.  Rest with your legs elevated if you have leg cramps or low back pain.  Visit your dentist if you have not gone yet during your pregnancy. Use a soft toothbrush to brush your teeth and be gentle when you floss.  A sexual relationship may be continued unless your health care provider directs you otherwise.  Continue to go to all your prenatal visits as directed by your health care provider. SEEK MEDICAL CARE IF:   You have dizziness.  You have mild pelvic cramps, pelvic pressure, or nagging pain in the abdominal area.  You have persistent nausea, vomiting, or diarrhea.  You have a bad smelling vaginal discharge.  You have pain with urination. SEEK IMMEDIATE MEDICAL CARE IF:   You have a fever.  You are leaking fluid from your vagina.  You have spotting or bleeding from your vagina.  You have severe abdominal cramping or pain.  You have rapid weight gain or loss.  You have shortness of breath with chest pain.  You notice sudden or extreme swelling of your face, hands, ankles, feet, or legs.  You have not felt your baby move in over an hour.  You have severe headaches that do not go away with  medicine.  You have vision changes. Document Released: 05/18/2001 Document Revised: 05/29/2013 Document Reviewed: 07/25/2012 [employer] Patient Information 2015 [employer]. This information is not intended to replace advice given to you by your health care provider. Make sure you discuss any questions you have with your health care provider.  

## 2014-07-08 ENCOUNTER — Encounter: Payer: Self-pay | Admitting: Women's Health

## 2014-07-17 ENCOUNTER — Encounter: Payer: Self-pay | Admitting: Women's Health

## 2014-07-17 ENCOUNTER — Ambulatory Visit (INDEPENDENT_AMBULATORY_CARE_PROVIDER_SITE_OTHER): Payer: BLUE CROSS/BLUE SHIELD | Admitting: Women's Health

## 2014-07-17 VITALS — BP 118/62 | Wt 144.0 lb

## 2014-07-17 DIAGNOSIS — Z3492 Encounter for supervision of normal pregnancy, unspecified, second trimester: Secondary | ICD-10-CM

## 2014-07-17 DIAGNOSIS — Z331 Pregnant state, incidental: Secondary | ICD-10-CM

## 2014-07-17 DIAGNOSIS — Z1389 Encounter for screening for other disorder: Secondary | ICD-10-CM

## 2014-07-17 LAB — POCT URINALYSIS DIPSTICK
Blood, UA: NEGATIVE
Glucose, UA: NEGATIVE
Ketones, UA: NEGATIVE
LEUKOCYTES UA: NEGATIVE
NITRITE UA: NEGATIVE
Protein, UA: NEGATIVE

## 2014-07-17 NOTE — Patient Instructions (Addendum)
You will have your sugar test next visit.  Please do not eat or drink anything after midnight the night before you come, not even water.  You will be here for at least two hours.     Call the office [phone]) or go to [facility] if:  You begin to have strong, frequent contractions  Your water breaks.  Sometimes it is a big gush of fluid, sometimes it is just a trickle that keeps getting your panties wet or running down your legs  You have vaginal bleeding.  It is normal to have a small amount of spotting if your cervix was checked.   You don't feel your baby moving like normal.  If you don't, get you something to eat and drink and lay down and focus on feeling your baby move.   If your baby is still not moving like normal, you should call the office or go to [facility].   For your lower back pain you may:  Purchase a pregnancy belt from [employer], [employer], [employer], etc and wear it while you are up and about  Take warm baths  Use a heating pad to your lower back for no longer than 20 minutes at a time, and do not place near abdomen  Take tylenol as needed. Please follow directions on the bottle    Second Trimester of Pregnancy The second trimester is from week 13 through week 28, months 4 through 6. The second trimester is often a time when you feel your best. Your body has also adjusted to being pregnant, and you begin to feel better physically. Usually, morning sickness has lessened or quit completely, you may have more energy, and you may have an increase in appetite. The second trimester is also a time when the fetus is growing rapidly. At the end of the sixth month, the fetus is about 9 inches long and weighs about 1 pounds. You will likely begin to feel the baby move (quickening) between 18 and 20 weeks of the pregnancy. BODY CHANGES Your body goes through many changes during pregnancy. The changes vary from woman to woman.  Your weight will continue to  increase. You will notice your lower abdomen bulging out. You may begin to get stretch marks on your hips, abdomen, and breasts. You may develop headaches that can be relieved by medicines approved by your health care provider. You may urinate more often because the fetus is pressing on your bladder. You may develop or continue to have heartburn as a result of your pregnancy. You may develop constipation because certain hormones are causing the muscles that push waste through your intestines to slow down. You may develop hemorrhoids or swollen, bulging veins (varicose veins). You may have back pain because of the weight gain and pregnancy hormones relaxing your joints between the bones in your pelvis and as a result of a shift in weight and the muscles that support your balance. Your breasts will continue to grow and be tender. Your gums may bleed and may be sensitive to brushing and flossing. Dark spots or blotches (chloasma, mask of pregnancy) may develop on your face. This will likely fade after the baby is born. A dark line from your belly button to the pubic area (linea nigra) may appear. This will likely fade after the baby is born. You may have changes in your hair. These can include thickening of your hair, rapid growth, and changes in texture. Some women also have hair loss  during or after pregnancy, or hair that feels dry or thin. Your hair will most likely return to normal after your baby is born. WHAT TO EXPECT AT YOUR PRENATAL VISITS During a routine prenatal visit: You will be weighed to make sure you and the fetus are growing normally. Your blood pressure will be taken. Your abdomen will be measured to track your baby's growth. The fetal heartbeat will be listened to. Any test results from the previous visit will be discussed. Your health care provider may ask you: How you are feeling. If you are feeling the baby move. If you have had any abnormal symptoms, such as leaking  fluid, bleeding, severe headaches, or abdominal cramping. If you have any questions. Other tests that may be performed during your second trimester include: Blood tests that check for: Low iron levels (anemia). Gestational diabetes (between 24 and 28 weeks). Rh antibodies. Urine tests to check for infections, diabetes, or protein in the urine. An ultrasound to confirm the proper growth and development of the baby. An amniocentesis to check for possible genetic problems. Fetal screens for spina bifida and Down syndrome. HOME CARE INSTRUCTIONS  Avoid all smoking, herbs, alcohol, and unprescribed drugs. These chemicals affect the formation and growth of the baby. Follow your health care provider's instructions regarding medicine use. There are medicines that are either safe or unsafe to take during pregnancy. Exercise only as directed by your health care provider. Experiencing uterine cramps is a good sign to stop exercising. Continue to eat regular, healthy meals. Wear a good support bra for breast tenderness. Do not use hot tubs, steam rooms, or saunas. Wear your seat belt at all times when driving. Avoid raw meat, uncooked cheese, cat litter boxes, and soil used by cats. These carry germs that can cause birth defects in the baby. Take your prenatal vitamins. Try taking a stool softener (if your health care provider approves) if you develop constipation. Eat more high-fiber foods, such as fresh vegetables or fruit and whole grains. Drink plenty of fluids to keep your urine clear or pale yellow. Take warm sitz baths to soothe any pain or discomfort caused by hemorrhoids. Use hemorrhoid cream if your health care provider approves. If you develop varicose veins, wear support hose. Elevate your feet for 15 minutes, 3-4 times a day. Limit salt in your diet. Avoid heavy lifting, wear low heel shoes, and practice good posture. Rest with your legs elevated if you have leg cramps or low back  pain. Visit your dentist if you have not gone yet during your pregnancy. Use a soft toothbrush to brush your teeth and be gentle when you floss. A sexual relationship may be continued unless your health care provider directs you otherwise. Continue to go to all your prenatal visits as directed by your health care provider. SEEK MEDICAL CARE IF:  You have dizziness. You have mild pelvic cramps, pelvic pressure, or nagging pain in the abdominal area. You have persistent nausea, vomiting, or diarrhea. You have a bad smelling vaginal discharge. You have pain with urination. SEEK IMMEDIATE MEDICAL CARE IF:  You have a fever. You are leaking fluid from your vagina. You have spotting or bleeding from your vagina. You have severe abdominal cramping or pain. You have rapid weight gain or loss. You have shortness of breath with chest pain. You notice sudden or extreme swelling of your face, hands, ankles, feet, or legs. You have not felt your baby move in over an hour. You have severe headaches that  do not go away with medicine. You have vision changes. Document Released: 05/18/2001 Document Revised: 05/29/2013 Document Reviewed: 07/25/2012 [facility] Patient Information 2015 [place], [place]. This information is not intended to replace advice given to you by your health care provider. Make sure you discuss any questions you have with your health care provider.

## 2014-07-17 NOTE — Progress Notes (Signed)
Low-risk OB appointment G4P0030 [id number] Estimated Date of Delivery: 10/23/14 BP 118/62 mmHg  Wt 144 lb (65.318 kg)  LMP 01/16/2014  BP, weight, and urine reviewed.  Refer to obstetrical flow sheet for FH & FHR.  Reports good fm.  Denies regular uc's, lof, vb, or uti s/s. Some LBP. Works 50hrs/wk in [employer]-can't cut back hours. Is requesting to fill out papers to decrease/come out of work. Discussed this would be her decision and not medically necessary. Missed last appt- had to reschedule.  Reviewed LBP relief measures, ptl s/s, fm. Plan:  Continue routine obstetrical care  F/U in 2wks for OB appointment and pn2

## 2014-07-21 ENCOUNTER — Inpatient Hospital Stay (HOSPITAL_COMMUNITY)
Admission: AD | Admit: 2014-07-21 | Discharge: 2014-07-22 | Disposition: A | Discharge status: Home / Self Care | Payer: BLUE CROSS/BLUE SHIELD | Source: Ambulatory Visit | Attending: Obstetrics and Gynecology | Admitting: Obstetrics and Gynecology

## 2014-07-21 DIAGNOSIS — N949 Unspecified condition associated with female genital organs and menstrual cycle: Secondary | ICD-10-CM

## 2014-07-21 DIAGNOSIS — Z3492 Encounter for supervision of normal pregnancy, unspecified, second trimester: Secondary | ICD-10-CM

## 2014-07-21 DIAGNOSIS — Z3A26 26 weeks gestation of pregnancy: Secondary | ICD-10-CM | POA: Insufficient documentation

## 2014-07-21 DIAGNOSIS — O9989 Other specified diseases and conditions complicating pregnancy, childbirth and the puerperium: Secondary | ICD-10-CM | POA: Insufficient documentation

## 2014-07-21 DIAGNOSIS — R1032 Left lower quadrant pain: Secondary | ICD-10-CM | POA: Insufficient documentation

## 2014-07-22 ENCOUNTER — Encounter: Payer: Self-pay | Admitting: Obstetrics and Gynecology

## 2014-07-22 ENCOUNTER — Encounter (HOSPITAL_COMMUNITY): Payer: Self-pay | Admitting: *Deleted

## 2014-07-22 DIAGNOSIS — Z3A26 26 weeks gestation of pregnancy: Secondary | ICD-10-CM | POA: Diagnosis not present

## 2014-07-22 DIAGNOSIS — O9989 Other specified diseases and conditions complicating pregnancy, childbirth and the puerperium: Secondary | ICD-10-CM | POA: Diagnosis not present

## 2014-07-22 DIAGNOSIS — R1032 Left lower quadrant pain: Secondary | ICD-10-CM | POA: Diagnosis present

## 2014-07-22 LAB — URINALYSIS, ROUTINE W REFLEX MICROSCOPIC
Bilirubin Urine: NEGATIVE
GLUCOSE, UA: NEGATIVE mg/dL
Hgb urine dipstick: NEGATIVE
KETONES UR: NEGATIVE mg/dL
LEUKOCYTES UA: NEGATIVE
Nitrite: NEGATIVE
PH: 7 (ref 5.0–8.0)
PROTEIN: NEGATIVE mg/dL
Specific Gravity, Urine: 1.02 (ref 1.005–1.030)
UROBILINOGEN UA: 0.2 mg/dL (ref 0.0–1.0)

## 2014-07-22 LAB — FETAL FIBRONECTIN: Fetal Fibronectin: NEGATIVE

## 2014-07-22 NOTE — MAU Provider Note (Signed)
Chief Complaint:  No chief complaint on file.   None    HPI: Kimberly Fields is a 24 y.o. G4P0030 at [id number] who presents to maternity admissions reporting   G1P0 at 26 wks with c/o sharp, tight, left lower abd pain since 1800 tonight. Denies any vag bleeding, leaking or dysuria. Reports good fetal movement.  Denies contractions, leakage of fluid or vaginal bleeding. Good fetal movement. 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  Pregnancy Course:   Past Medical History: Past Medical History  Diagnosis Date  . Asthma   . UTI (lower urinary tract infection)     Past obstetric history: OB History  Gravida Para Term Preterm AB SAB TAB Ectopic Multiple Living  4 0 0 0 3 2 1 0 0 0     # Outcome Date GA Lbr Len/2nd Weight Sex Delivery Anes PTL Lv  4 Current           3 SAB 2013 [id number]         2 SAB 2012          1 TAB 2011              Past Surgical History: Past Surgical History  Procedure Laterality Date  . Tonsillectomy       Family History: Family History  Problem Relation Age of Onset  . Migraines Mother   . Asthma Mother   . Cancer Mother     cervical   . COPD Maternal Grandmother   . Cancer Maternal Grandmother     colon  . Anxiety disorder Maternal Grandmother   . Coronary artery disease Maternal Grandmother   . Cancer Maternal Grandfather     skin  . Coronary artery disease Maternal Grandfather   . Stroke Maternal Grandfather   . Tuberculosis Maternal Grandfather   . Bipolar disorder Brother     Social History: History  Substance Use Topics  . Smoking status: Never Smoker   . Smokeless tobacco: Never Used  . Alcohol Use: No    Allergies:  Allergies  Allergen Reactions  . Red Dye Swelling    Meds:  Prescriptions prior to admission  Medication Sig Dispense Refill Last Dose  . acetaminophen (TYLENOL) 325 MG tablet Take 650 mg by mouth every 6 (six) hours as needed.   07/21/2014 at Unknown  time  . desonide (DESOWEN) 0.05 % cream 1 application at bedtime.    07/22/2014 at Unknown time  . mupirocin ointment (BACTROBAN) 2 % 1 application at bedtime.    07/22/2014 at Unknown time  . Prenatal Vit-Fe Fumarate-FA (PRENATAL VITAMIN PO) Take by mouth daily.   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 Pain worse after long workdays.  Physical Exam  Blood pressure 113/68, pulse 87, temperature 99.5 F (37.5 C), temperature source Oral, resp. rate 16, height 5\' 5"  (1.651 m), weight 65.772 kg (145 lb), last menstrual period 01/16/2014. GENERAL: Well-developed, well-nourished female in no acute distress.  HEENT: normocephalic HEART: normal rate RESP: normal effort ABDOMEN: Soft, non-tender, gravid appropriate for gestational age, tender in LLQ in front of anterior superior iliac crest. EXTREMITIES: Nontender, no edema NEURO: alert and oriented SPECULUM EXAM: NEFG, physiologic discharge, no blood, cervix clean    FHT:  Baseline 150 , moderate variability, accelerations present, no decelerations Contractions: q none mins   Labs: No results found for this or any previous visit (from the  past 24 hour(s)).  Imaging:  No results found. MAU Course:  Assessment:   ICD-9-CM ICD-10-CM   1. Supervision of normal pregnancy, second trimester V22.1 Z34.92   2.   Round ligament pains  Plan: Discharge home Labor precautions and fetal kick counts    Medication List    ASK your doctor about these medications        acetaminophen 325 MG tablet  Commonly known as:  TYLENOL  Take 650 mg by mouth every 6 (six) hours as needed.     desonide 0.05 % cream  Commonly known as:  DESOWEN  1 application at bedtime.     mupirocin ointment 2 %  Commonly known as:  BACTROBAN  1 application at bedtime.     PRENATAL VITAMIN PO  Take by mouth daily.        [place], CNM 07/22/2014 1:05 AM  Will give note for pt to take to employer advising 40 hr  workweek, 8 hr days , to avoid prolonged workdays. Given recommendations for recovery methods after work, position changes,  Tub baths, etc. Tilda Burrow

## 2014-07-22 NOTE — Discharge Instructions (Signed)

## 2014-07-22 NOTE — MAU Note (Signed)
G1P0 at 26 wks with c/o sharp, tight,  left lower abd pain since 1800 tonight.  Denies any vag bleeding, leaking or dysuria. Reports good fetal movement.

## 2014-07-29 ENCOUNTER — Encounter: Payer: Self-pay | Admitting: Women's Health

## 2014-07-29 ENCOUNTER — Ambulatory Visit (INDEPENDENT_AMBULATORY_CARE_PROVIDER_SITE_OTHER): Payer: BLUE CROSS/BLUE SHIELD | Admitting: Women's Health

## 2014-07-29 ENCOUNTER — Other Ambulatory Visit: Payer: BLUE CROSS/BLUE SHIELD

## 2014-07-29 VITALS — BP 110/66 | Wt 148.0 lb

## 2014-07-29 DIAGNOSIS — Z3492 Encounter for supervision of normal pregnancy, unspecified, second trimester: Secondary | ICD-10-CM

## 2014-07-29 DIAGNOSIS — Z1389 Encounter for screening for other disorder: Secondary | ICD-10-CM

## 2014-07-29 DIAGNOSIS — Z131 Encounter for screening for diabetes mellitus: Secondary | ICD-10-CM

## 2014-07-29 DIAGNOSIS — Z113 Encounter for screening for infections with a predominantly sexual mode of transmission: Secondary | ICD-10-CM

## 2014-07-29 DIAGNOSIS — Z331 Pregnant state, incidental: Secondary | ICD-10-CM

## 2014-07-29 DIAGNOSIS — Z0184 Encounter for antibody response examination: Secondary | ICD-10-CM

## 2014-07-29 DIAGNOSIS — Z3403 Encounter for supervision of normal first pregnancy, third trimester: Secondary | ICD-10-CM

## 2014-07-29 DIAGNOSIS — Z114 Encounter for screening for human immunodeficiency virus [HIV]: Secondary | ICD-10-CM

## 2014-07-29 LAB — POCT URINALYSIS DIPSTICK
Glucose, UA: NEGATIVE
Ketones, UA: NEGATIVE
Leukocytes, UA: NEGATIVE
NITRITE UA: NEGATIVE
Protein, UA: NEGATIVE
RBC UA: NEGATIVE

## 2014-07-29 NOTE — Patient Instructions (Addendum)
For Dizzy Spells:   This is usually related to either your blood sugar or your blood pressure dropping  Make sure you are staying well hydrated and drinking enough water so that your urine is clear  Eat small frequent meals and snacks containing protein (meat, eggs, nuts, cheese) so that your blood sugar doesn't drop  If you do get dizzy, sit/lay down and get you something to drink and a snack containing protein- you will usually start feeling better in 10-20 minutes  Call the office [phone]) or go to [facility] if:  You begin to have strong, frequent contractions  Your water breaks.  Sometimes it is a big gush of fluid, sometimes it is just a trickle that keeps getting your panties wet or running down your legs  You have vaginal bleeding.  It is normal to have a small amount of spotting if your cervix was checked.   You don't feel your baby moving like normal.  If you don't, get you something to eat and drink and lay down and focus on feeling your baby move.  You should feel at least 10 movements in 2 hours.  If you don't, you should call the office or go to [facility].    Tdap Vaccine  It is recommended that you get the Tdap vaccine during the third trimester of EACH pregnancy to help protect your baby from getting pertussis (whooping cough)  27-36 weeks is the BEST time to do this so that you can pass the protection on to your baby. During pregnancy is better than after pregnancy, but if you are unable to get it during pregnancy it will be offered at the hospital.   You can get this vaccine at the health department or your family doctor  Everyone who will be around your baby should also be up-to-date on their vaccines. Adults (who are not pregnant) only need 1 dose of Tdap during adulthood.     Tums, prilosec, zantac, nexium for reflux if needed Heartburn During Pregnancy  Heartburn is a burning sensation in the chest caused by stomach acid backing up into the  esophagus. Heartburn is common in pregnancy because a certain hormone (progesterone) is released when a woman is pregnant. The progesterone hormone may relax the valve that separates the esophagus from the stomach. This allows acid to go up into the esophagus, causing heartburn. Heartburn may also happen in pregnancy because the enlarging uterus pushes up on the stomach, which pushes more acid into the esophagus. This is especially true in the later stages of pregnancy. Heartburn problems usually go away after giving birth. CAUSES  Heartburn is caused by stomach acid backing up into the esophagus. During pregnancy, this may result from various things, including:   The progesterone hormone.  Changing hormone levels.  The growing uterus pushing stomach acid upward.  Large meals.  Certain foods and drinks.  Exercise.  Increased acid production. SIGNS AND SYMPTOMS   Burning pain in the chest or lower throat.  Bitter taste in the mouth.  Coughing. DIAGNOSIS  Your health care provider will typically diagnose heartburn by taking a careful history of your concern. Blood tests may be done to check for a certain type of bacteria that is associated with heartburn. Sometimes, heartburn is diagnosed by prescribing a heartburn medicine to see if the symptoms improve. In some cases, a procedure called an endoscopy may be done. In this procedure, a tube with a light and a camera on the end (endoscope) is used  to examine the esophagus and the stomach. TREATMENT  Treatment will vary depending on the severity of your symptoms. Your health care provider may recommend:  Over-the-counter medicines (antacids, acid reducers) for mild heartburn.  Prescription medicines to decrease stomach acid or to protect your stomach lining.  Certain changes in your diet.  Elevating the head of your bed by putting blocks under the legs. This helps prevent stomach acid from backing up into the esophagus when you are  lying down. HOME CARE INSTRUCTIONS   Only take over-the-counter or prescription medicines as directed by your health care provider.  Raise the head of your bed by putting blocks under the legs if instructed to do so by your health care provider. Sleeping with more pillows is not effective because it only changes the position of your head.  Do not exercise right after eating.  Avoid eating 2-3 hours before bed. Do not lie down right after eating.  Eat small meals throughout the day instead of three large meals.  Identify foods and beverages that make your symptoms worse and avoid them. Foods you may want to avoid include:  Peppers.  Chocolate.  High-fat foods, including fried foods.  Spicy foods.  Garlic and onions.  Citrus fruits, including oranges, grapefruit, lemons, and limes.  Food containing tomatoes or tomato products.  Mint.  Carbonated and caffeinated drinks.  Vinegar. SEEK MEDICAL CARE IF:  You have abdominal pain of any kind.  You feel burning in your upper abdomen or chest, especially after eating or lying down.  You have nausea and vomiting.  Your stomach feels upset after you eat. SEEK IMMEDIATE MEDICAL CARE IF:   You have severe chest pain that goes down your arm or into your jaw or neck.  You feel sweaty, dizzy, or light-headed.  You become short of breath.  You vomit blood.  You have difficulty or pain with swallowing.  You have bloody or black, tarry stools.  You have episodes of heartburn more than 3 times a week, for more than 2 weeks. MAKE SURE YOU:  Understand these instructions.  Will watch your condition.  Will get help right away if you are not doing well or get worse. Document Released: 05/21/2000 Document Revised: 05/29/2013 Document Reviewed: 01/10/2013 [facility] Patient Information 2015 [place], [place]. This information is not intended to replace advice given to you by your health care provider. Make sure you discuss any  questions you have with your health care provider.  Third Trimester of Pregnancy The third trimester is from week 29 through week 42, months 7 through 9. The third trimester is a time when the fetus is growing rapidly. At the end of the ninth month, the fetus is about 20 inches in length and weighs 6-10 pounds.  BODY CHANGES Your body goes through many changes during pregnancy. The changes vary from woman to woman.   Your weight will continue to increase. You can expect to gain 25-35 pounds (11-16 kg) by the end of the pregnancy.  You may begin to get stretch marks on your hips, abdomen, and breasts.  You may urinate more often because the fetus is moving lower into your pelvis and pressing on your bladder.  You may develop or continue to have heartburn as a result of your pregnancy.  You may develop constipation because certain hormones are causing the muscles that push waste through your intestines to slow down.  You may develop hemorrhoids or swollen, bulging veins (varicose veins).  You may have pelvic pain  because of the weight gain and pregnancy hormones relaxing your joints between the bones in your pelvis. Backaches may result from overexertion of the muscles supporting your posture.  You may have changes in your hair. These can include thickening of your hair, rapid growth, and changes in texture. Some women also have hair loss during or after pregnancy, or hair that feels dry or thin. Your hair will most likely return to normal after your baby is born.  Your breasts will continue to grow and be tender. A yellow discharge may leak from your breasts called colostrum.  Your belly button may stick out.  You may feel short of breath because of your expanding uterus.  You may notice the fetus "dropping," or moving lower in your abdomen.  You may have a bloody mucus discharge. This usually occurs a few days to a week before labor begins.  Your cervix becomes thin and soft  (effaced) near your due date. WHAT TO EXPECT AT YOUR PRENATAL EXAMS  You will have prenatal exams every 2 weeks until week 36. Then, you will have weekly prenatal exams. During a routine prenatal visit:  You will be weighed to make sure you and the fetus are growing normally.  Your blood pressure is taken.  Your abdomen will be measured to track your baby's growth.  The fetal heartbeat will be listened to.  Any test results from the previous visit will be discussed.  You may have a cervical check near your due date to see if you have effaced. At around 36 weeks, your caregiver will check your cervix. At the same time, your caregiver will also perform a test on the secretions of the vaginal tissue. This test is to determine if a type of bacteria, Group B streptococcus, is present. Your caregiver will explain this further. Your caregiver may ask you:  What your birth plan is.  How you are feeling.  If you are feeling the baby move.  If you have had any abnormal symptoms, such as leaking fluid, bleeding, severe headaches, or abdominal cramping.  If you have any questions. Other tests or screenings that may be performed during your third trimester include:  Blood tests that check for low iron levels (anemia).  Fetal testing to check the health, activity level, and growth of the fetus. Testing is done if you have certain medical conditions or if there are problems during the pregnancy. FALSE LABOR You may feel small, irregular contractions that eventually go away. These are called Braxton Hicks contractions, or false labor. Contractions may last for hours, days, or even weeks before true labor sets in. If contractions come at regular intervals, intensify, or become painful, it is best to be seen by your caregiver.  SIGNS OF LABOR   Menstrual-like cramps.  Contractions that are 5 minutes apart or less.  Contractions that start on the top of the uterus and spread down to the lower  abdomen and back.  A sense of increased pelvic pressure or back pain.  A watery or bloody mucus discharge that comes from the vagina. If you have any of these signs before the 37th week of pregnancy, call your caregiver right away. You need to go to the hospital to get checked immediately. HOME CARE INSTRUCTIONS   Avoid all smoking, herbs, alcohol, and unprescribed drugs. These chemicals affect the formation and growth of the baby.  Follow your caregiver's instructions regarding medicine use. There are medicines that are either safe or unsafe to take during pregnancy.  Exercise only as directed by your caregiver. Experiencing uterine cramps is a good sign to stop exercising.  Continue to eat regular, healthy meals.  Wear a good support bra for breast tenderness.  Do not use hot tubs, steam rooms, or saunas.  Wear your seat belt at all times when driving.  Avoid raw meat, uncooked cheese, cat litter boxes, and soil used by cats. These carry germs that can cause birth defects in the baby.  Take your prenatal vitamins.  Try taking a stool softener (if your caregiver approves) if you develop constipation. Eat more high-fiber foods, such as fresh vegetables or fruit and whole grains. Drink plenty of fluids to keep your urine clear or pale yellow.  Take warm sitz baths to soothe any pain or discomfort caused by hemorrhoids. Use hemorrhoid cream if your caregiver approves.  If you develop varicose veins, wear support hose. Elevate your feet for 15 minutes, 3-4 times a day. Limit salt in your diet.  Avoid heavy lifting, wear low heal shoes, and practice good posture.  Rest a lot with your legs elevated if you have leg cramps or low back pain.  Visit your dentist if you have not gone during your pregnancy. Use a soft toothbrush to brush your teeth and be gentle when you floss.  A sexual relationship may be continued unless your caregiver directs you otherwise.  Do not travel far  distances unless it is absolutely necessary and only with the approval of your caregiver.  Take prenatal classes to understand, practice, and ask questions about the labor and delivery.  Make a trial run to the hospital.  Pack your hospital bag.  Prepare the baby's nursery.  Continue to go to all your prenatal visits as directed by your caregiver. SEEK MEDICAL CARE IF:  You are unsure if you are in labor or if your water has broken.  You have dizziness.  You have mild pelvic cramps, pelvic pressure, or nagging pain in your abdominal area.  You have persistent nausea, vomiting, or diarrhea.  You have a bad smelling vaginal discharge.  You have pain with urination. SEEK IMMEDIATE MEDICAL CARE IF:   You have a fever.  You are leaking fluid from your vagina.  You have spotting or bleeding from your vagina.  You have severe abdominal cramping or pain.  You have rapid weight loss or gain.  You have shortness of breath with chest pain.  You notice sudden or extreme swelling of your face, hands, ankles, feet, or legs.  You have not felt your baby move in over an hour.  You have severe headaches that do not go away with medicine.  You have vision changes. Document Released: 05/18/2001 Document Revised: 05/29/2013 Document Reviewed: 07/25/2012 [facility] Patient Information 2015 [place], [place]. This information is not intended to replace advice given to you by your health care provider. Make sure you discuss any questions you have with your health care provider.

## 2014-07-29 NOTE — Progress Notes (Signed)
Low-risk OB appointment [id number] Estimated Date of Delivery: 10/23/14 BP 110/66 mmHg  Wt 148 lb (67.132 kg)  LMP 01/16/2014  BP, weight, reviewed.  Unable to void, will try again before she leaves. Refer to obstetrical flow sheet for FH & FHR.  Reports good fm.  Denies regular uc's, lof, vb, or uti s/s. Did end up going to [employer] for pain r/t long work hours and got note to decrease hours to 40/wk.  Reviewed ptl s/s, fkc. Recommended Tdap at HD/PCP per CDC guidelines.  Plan:  Continue routine obstetrical care  F/U in 4wks for OB appointment  PN2 today

## 2014-07-30 LAB — GLUCOSE TOLERANCE, 2 HOURS W/ 1HR
GLUCOSE, FASTING: 72 mg/dL (ref 65–91)
Glucose, 1 hour: 133 mg/dL (ref 65–179)
Glucose, 2 hour: 117 mg/dL (ref 65–152)

## 2014-07-30 LAB — HSV 2 ANTIBODY, IGG: HSV 2 Glycoprotein G Ab, IgG: <0.91 index (ref 0.00–0.90)

## 2014-07-30 LAB — CBC
HCT: 32.1 % — ABNORMAL LOW (ref 34.0–46.6)
HEMOGLOBIN: 10.9 g/dL — AB (ref 11.1–15.9)
MCH: 30.3 pg (ref 26.6–33.0)
MCHC: 34 g/dL (ref 31.5–35.7)
MCV: 89 fL (ref 79–97)
PLATELETS: 221 10*3/uL (ref 150–379)
RBC: 3.6 x10E6/uL — ABNORMAL LOW (ref 3.77–5.28)
RDW: 13.2 % (ref 12.3–15.4)
WBC: 9.3 10*3/uL (ref 3.4–10.8)

## 2014-07-30 LAB — ANTIBODY SCREEN: Antibody Screen: NEGATIVE

## 2014-07-30 LAB — HIV ANTIBODY (ROUTINE TESTING W REFLEX): HIV Screen 4th Generation wRfx: NONREACTIVE

## 2014-07-30 LAB — RPR: RPR Ser Ql: NONREACTIVE

## 2014-08-26 ENCOUNTER — Encounter: Payer: BLUE CROSS/BLUE SHIELD | Admitting: Women's Health

## 2014-08-28 ENCOUNTER — Ambulatory Visit (INDEPENDENT_AMBULATORY_CARE_PROVIDER_SITE_OTHER): Payer: BLUE CROSS/BLUE SHIELD | Admitting: Women's Health

## 2014-08-28 VITALS — BP 110/60 | HR 97 | Wt 154.0 lb

## 2014-08-28 DIAGNOSIS — Z3493 Encounter for supervision of normal pregnancy, unspecified, third trimester: Secondary | ICD-10-CM

## 2014-08-28 DIAGNOSIS — Z331 Pregnant state, incidental: Secondary | ICD-10-CM

## 2014-08-28 DIAGNOSIS — Z1389 Encounter for screening for other disorder: Secondary | ICD-10-CM

## 2014-08-28 LAB — POCT URINALYSIS DIPSTICK
Blood, UA: NEGATIVE
Glucose, UA: NEGATIVE
KETONES UA: NEGATIVE
Leukocytes, UA: NEGATIVE
Nitrite, UA: NEGATIVE
Protein, UA: NEGATIVE

## 2014-08-28 MED ORDER — FUSION PLUS PO CAPS: 1.0000 | ORAL_CAPSULE | ORAL | Status: DC

## 2014-08-28 NOTE — Patient Instructions (Addendum)
Tips to Help You Sleep Better:   Get into a bedtime routine, try to do the same thing every night before going to bed to try to help your body wind down  Warm baths  Avoid caffeine for at least 3 hours before going to sleep   Keep your room at a slightly cooler temperature, can try running a fan  Turn off TV, lights, phone, electronics  Lots of pillows if needed to help you get comfortable  Lavender scented items can help you sleep. You can place lavender essential oil on a cotton ball and place under your pillowcase, or place in a diffuser. [employer] has a lavender scented sleep line (plug-ins, sprays, etc). Look in the pillow aisle for lavender scented pillows.   If none of the above things help, you can try 1/2 of a benadryl, unisom, or tylenol pm. Do not take this every night, only when you really need it.    Call the office [phone]) or [facility] if:  You begin to have strong, frequent contractions  Your water breaks.  Sometimes it is a big gush of fluid, sometimes it is just a trickle that keeps getting your panties wet or running down your legs  You have vaginal bleeding.  It is normal to have a small amount of spotting if your cervix was checked.   You don't feel your baby moving like normal.  If you don't, get you something to eat and drink and lay down and focus on feeling your baby move.  You should feel at least 10 movements in 2 hours.  If you don't, you should call the office or [facility].    Preterm Labor Information Preterm labor is when labor starts at less than 37 weeks of pregnancy. The normal length of a pregnancy is 39 to 41 weeks. CAUSES Often, there is no identifiable underlying cause as to why a woman goes into preterm labor. One of the most common known causes of preterm labor is infection. Infections of the uterus, cervix, vagina, amniotic sac, bladder, kidney, or even the lungs (pneumonia) can cause labor to start. Other suspected  causes of preterm labor include:  13. Urogenital infections, such as yeast infections and bacterial vaginosis.  14. Uterine abnormalities (uterine shape, uterine septum, fibroids, or bleeding from the placenta).  15. A cervix that has been operated on (it may fail to stay closed).  16. Malformations in the fetus.  17. Multiple gestations (twins, triplets, and so on).  18. Breakage of the amniotic sac.  RISK FACTORS  Having a previous history of preterm labor.   Having premature rupture of membranes (PROM).   Having a placenta that covers the opening of the cervix (placenta previa).   Having a placenta that separates from the uterus (placental abruption).   Having a cervix that is too weak to hold the fetus in the uterus (incompetent cervix).   Having too much fluid in the amniotic sac (polyhydramnios).   Taking illegal drugs or smoking while pregnant.   Not gaining enough weight while pregnant.   Being younger than 5318 and older than 24 years old.   Having a low socioeconomic status.   Being African American. SYMPTOMS Signs and symptoms of preterm labor include:   Menstrual-like cramps, abdominal pain, or back pain.  Uterine contractions that are regular, as frequent as six in an hour, regardless of their intensity (may be mild or painful).  Contractions that start on the top of the  uterus and spread down to the lower abdomen and back.   A sense of increased pelvic pressure.   A watery or bloody mucus discharge that comes from the vagina.  TREATMENT Depending on the length of the pregnancy and other circumstances, your health care provider may suggest bed rest. If necessary, there are medicines that can be given to stop contractions and to mature the fetal lungs. If labor happens before 34 weeks of pregnancy, a prolonged hospital stay may be recommended. Treatment depends on the condition of both you and the fetus.  WHAT SHOULD YOU DO IF YOU THINK YOU ARE  IN PRETERM LABOR? Call your health care provider right away. You will need to go to the hospital to get checked immediately. HOW CAN YOU PREVENT PRETERM LABOR IN FUTURE PREGNANCIES? You should:   Stop smoking if you smoke.  Maintain healthy weight gain and avoid chemicals and drugs that are not necessary.  Be watchful for any type of infection.  Inform your health care provider if you have a known history of preterm labor. Document Released: 08/14/2003 Document Revised: 01/24/2013 Document Reviewed: 06/26/2012 [facility] Patient Information 2015 [place], [place]. This information is not intended to replace advice given to you by your health care provider. Make sure you discuss any questions you have with your health care provider.

## 2014-08-28 NOTE — Progress Notes (Signed)
Low-risk OB appointment [id number] Estimated Date of Delivery: 10/23/14 BP 110/60 mmHg  Pulse 97  Wt 154 lb (69.854 kg)  LMP 01/16/2014  BP, weight, and urine reviewed.  Refer to obstetrical flow sheet for FH & FHR.  Reports good fm.  Denies regular uc's, lof, vb, or uti s/s. Some BH, none in few days. Not sleeping well-discussed relief measures.  Reviewed pn2 results, ptl s/s, fkc. rx fusion plus, to increase fe-rich foods.  Plan:  Continue routine obstetrical care  F/U in 2wks for OB appointment

## 2014-09-09 ENCOUNTER — Telehealth: Payer: Self-pay | Admitting: Women's Health

## 2014-09-09 ENCOUNTER — Encounter: Payer: Self-pay | Admitting: Women's Health

## 2014-09-09 ENCOUNTER — Other Ambulatory Visit (INDEPENDENT_AMBULATORY_CARE_PROVIDER_SITE_OTHER): Payer: BLUE CROSS/BLUE SHIELD

## 2014-09-09 ENCOUNTER — Ambulatory Visit (INDEPENDENT_AMBULATORY_CARE_PROVIDER_SITE_OTHER): Payer: BLUE CROSS/BLUE SHIELD | Admitting: Women's Health

## 2014-09-09 VITALS — BP 108/60 | HR 76 | Wt 155.0 lb

## 2014-09-09 DIAGNOSIS — Z331 Pregnant state, incidental: Secondary | ICD-10-CM

## 2014-09-09 DIAGNOSIS — Z3493 Encounter for supervision of normal pregnancy, unspecified, third trimester: Secondary | ICD-10-CM

## 2014-09-09 DIAGNOSIS — N898 Other specified noninflammatory disorders of vagina: Secondary | ICD-10-CM

## 2014-09-09 DIAGNOSIS — O26893 Other specified pregnancy related conditions, third trimester: Secondary | ICD-10-CM | POA: Diagnosis not present

## 2014-09-09 DIAGNOSIS — Z1389 Encounter for screening for other disorder: Secondary | ICD-10-CM

## 2014-09-09 LAB — POCT URINALYSIS DIPSTICK
Blood, UA: NEGATIVE
GLUCOSE UA: NEGATIVE
Ketones, UA: NEGATIVE
Leukocytes, UA: NEGATIVE
Nitrite, UA: NEGATIVE
Protein, UA: NEGATIVE

## 2014-09-09 NOTE — Progress Notes (Signed)
US 6170w5d  C/w dates,afi 15.6cm,efw 2147g(4lbs12oz),normal ovs bilat,cx appears closed,cephalic,ant pl grade 2

## 2014-09-09 NOTE — Telephone Encounter (Signed)
Pt states that she thinks she is leaking amniotic fluid, several times a day since Saturday her underwear are wet with clear odorless fluid.  Pt advised to come in today at 1:45 and see Kim.  Pt verbalized understanding.

## 2014-09-09 NOTE — Patient Instructions (Signed)
[place] Pediatricians/Family Doctors:  [facility] [phone]            [facility] [phone]                 [facility] [phone] (usually not accepting new patients unless you have family there already, you are always welcome to call and ask)            [facility] ([address] [place]) [phone]   [place] Pediatricians/Family Doctors:   [facility]: [phone]  [facility]: [phone]   Call the office ([phone]) or go to [facility] if:  You begin to have strong, frequent contractions  Your water breaks.  Sometimes it is a big gush of fluid, sometimes it is just a trickle that keeps getting your panties wet or running down your legs  You have vaginal bleeding.  It is normal to have a small amount of spotting if your cervix was checked.   You don't feel your baby moving like normal.  If you don't, get you something to eat and drink and lay down and focus on feeling your baby move.  You should feel at least 10 movements in 2 hours.  If you don't, you should call the office or go to [facility].    Preterm Labor Information Preterm labor is when labor starts at less than 37 weeks of pregnancy. The normal length of a pregnancy is 39 to 41 weeks. CAUSES Often, there is no identifiable underlying cause as to why a woman goes into preterm labor. One of the most common known causes of preterm labor is infection. Infections of the uterus, cervix, vagina, amniotic sac, bladder, kidney, or even the lungs (pneumonia) can cause labor to start. Other suspected causes of preterm labor include:   Urogenital infections, such as yeast infections and bacterial vaginosis.   Uterine abnormalities (uterine shape, uterine septum, fibroids, or bleeding from the placenta).   A cervix that has been operated on (it may fail to stay closed).   Malformations in the fetus.   Multiple  gestations (twins, triplets, and so on).   Breakage of the amniotic sac.  RISK FACTORS  Having a previous history of preterm labor.   Having premature rupture of membranes (PROM).   Having a placenta that covers the opening of the cervix (placenta previa).   Having a placenta that separates from the uterus (placental abruption).   Having a cervix that is too weak to hold the fetus in the uterus (incompetent cervix).   Having too much fluid in the amniotic sac (polyhydramnios).   Taking illegal drugs or smoking while pregnant.   Not gaining enough weight while pregnant.   Being younger than 18 and older than 24 years old.   Having a low socioeconomic status.   Being African American. SYMPTOMS Signs and symptoms of preterm labor include:   Menstrual-like cramps, abdominal pain, or back pain.  Uterine contractions that are regular, as frequent as six in an hour, regardless of their intensity (may be mild or painful).  Contractions that start on the top of the uterus and spread down to the lower abdomen and back.   A sense of increased pelvic pressure.   A watery or bloody mucus discharge that comes from the vagina.  TREATMENT Depending on the length of the pregnancy and other circumstances, your health care provider may suggest bed rest. If necessary, there are medicines that can be given to stop contractions and   to mature the fetal lungs. If labor happens before 34 weeks of pregnancy, a prolonged hospital stay may be recommended. Treatment depends on the condition of both you and the fetus.  WHAT SHOULD YOU DO IF YOU THINK YOU ARE IN PRETERM LABOR? Call your health care provider right away. You will need to go to the hospital to get checked immediately. HOW CAN YOU PREVENT PRETERM LABOR IN FUTURE PREGNANCIES? You should:   Stop smoking if you smoke.  Maintain healthy weight gain and avoid chemicals and drugs that are not necessary.  Be watchful for any  type of infection.  Inform your health care provider if you have a known history of preterm labor. Document Released: 08/14/2003 Document Revised: 01/24/2013 Document Reviewed: 06/26/2012 [employer] Patient Information 2015 [employer]. This information is not intended to replace advice given to you by your health care provider. Make sure you discuss any questions you have with your health care provider.  

## 2014-09-09 NOTE — Progress Notes (Signed)
Work-in Low-risk OB appointment G4P0030 6767w5d Estimated Date of Delivery: 10/23/14 BP 108/60 mmHg  Pulse 76  Wt 155 lb (70.308 kg)  LMP 01/16/2014  BP, weight, and urine reviewed.  Refer to obstetrical flow sheet for FH & FHR.  Reports good fm.  Denies regular uc's, vb, or uti s/s. Leaking clear fluid x 2 weeks, had to change underwear yesterday.  SSE: cx visually closed, no pooling, no change w/ valsalva, nitrazine & fern neg. Pt still concerned, afi ordered. AFI 15.6cm, efw 36.5%. Pt reassured.  Reviewed ptl s/s, fkc. Plan:  Continue routine obstetrical care  F/U in 2wks for OB appointment

## 2014-09-11 ENCOUNTER — Encounter: Payer: BLUE CROSS/BLUE SHIELD | Admitting: Women's Health

## 2014-09-23 ENCOUNTER — Encounter: Payer: BLUE CROSS/BLUE SHIELD | Admitting: Women's Health

## 2014-09-25 ENCOUNTER — Encounter: Payer: Self-pay | Admitting: Women's Health

## 2014-09-25 ENCOUNTER — Ambulatory Visit (INDEPENDENT_AMBULATORY_CARE_PROVIDER_SITE_OTHER): Payer: BLUE CROSS/BLUE SHIELD | Admitting: Women's Health

## 2014-09-25 VITALS — BP 110/68 | HR 80 | Wt 161.0 lb

## 2014-09-25 DIAGNOSIS — Z3493 Encounter for supervision of normal pregnancy, unspecified, third trimester: Secondary | ICD-10-CM

## 2014-09-25 DIAGNOSIS — Z331 Pregnant state, incidental: Secondary | ICD-10-CM

## 2014-09-25 DIAGNOSIS — Z1389 Encounter for screening for other disorder: Secondary | ICD-10-CM

## 2014-09-25 LAB — POCT URINALYSIS DIPSTICK
Blood, UA: NEGATIVE
Glucose, UA: NEGATIVE
Ketones, UA: NEGATIVE
NITRITE UA: NEGATIVE
PROTEIN UA: NEGATIVE

## 2014-09-25 NOTE — Progress Notes (Signed)
Low-risk OB appointment G4P0030 7678w0d Estimated Date of Delivery: 10/23/14 BP 110/68 mmHg  Pulse 80  Wt 161 lb (73.029 kg)  LMP 01/16/2014  BP, weight, and urine reviewed.  Refer to obstetrical flow sheet for FH & FHR.  Reports good fm.  Denies regular uc's, lof, vb, or uti s/s. Dizzy spells- discussed prevention/relief measures. RLP- discussed prevention measures.  Reviewed ptl s/s, fkc. Plan:  Continue routine obstetrical care  F/U in 1wk for OB appointment and gbs

## 2014-09-25 NOTE — Patient Instructions (Addendum)
For Dizzy Spells:   This is usually related to either your blood sugar or your blood pressure dropping  Make sure you are staying well hydrated and drinking enough water so that your urine is clear  Eat small frequent meals and snacks containing protein (meat, eggs, nuts, cheese) so that your blood sugar doesn't drop  If you do get dizzy, sit/lay down and get you something to drink and a snack containing protein- you will usually start feeling better in 10-20 minutes   Call the office [phone]) or go to [facility] if:  You begin to have strong, frequent contractions  Your water breaks.  Sometimes it is a big gush of fluid, sometimes it is just a trickle that keeps getting your panties wet or running down your legs  You have vaginal bleeding.  It is normal to have a small amount of spotting if your cervix was checked.   You don't feel your baby moving like normal.  If you don't, get you something to eat and drink and lay down and focus on feeling your baby move.  You should feel at least 10 movements in 2 hours.  If you don't, you should call the office or go to [facility].    [place] Pediatricians/Family Doctors:  [facility] [phone]            [facility] [phone]                 [facility] [phone] (usually not accepting new patients unless you have family there already, you are always welcome to call and ask)            [facility] ([address]) [phone]   [facility] Pediatricians/Family Doctors:   [facility]: [phone]  [facility]: [phone]    Preterm Labor Information Preterm labor is when labor starts at less than 37 weeks of pregnancy. The normal length of a pregnancy is 39 to 41 weeks. CAUSES Often, there is no identifiable underlying cause as to why a woman goes into preterm labor. One of the most common known causes of preterm labor  is infection. Infections of the uterus, cervix, vagina, amniotic sac, bladder, kidney, or even the lungs (pneumonia) can cause labor to start. Other suspected causes of preterm labor include:  10. Urogenital infections, such as yeast infections and bacterial vaginosis.  11. Uterine abnormalities (uterine shape, uterine septum, fibroids, or bleeding from the placenta).  12. A cervix that has been operated on (it may fail to stay closed).  13. Malformations in the fetus.  14. Multiple gestations (twins, triplets, and so on).  15. Breakage of the amniotic sac.  RISK FACTORS 2. Having a previous history of preterm labor.  3. Having premature rupture of membranes (PROM).  4. Having a placenta that covers the opening of the cervix (placenta previa).  5. Having a placenta that separates from the uterus (placental abruption).  6. Having a cervix that is too weak to hold the fetus in the uterus (incompetent cervix).  7. Having too much fluid in the amniotic sac (polyhydramnios).  8. Taking illegal drugs or smoking while pregnant.  9. Not gaining enough weight while pregnant.  10. Being younger than 78 and older than 24 years old.  11. Having a low socioeconomic status.  12. Being African American. SYMPTOMS Signs and symptoms of preterm labor include:  2. Menstrual-like cramps, abdominal pain, or back pain. 3. Uterine contractions that are regular, as  frequent as six in an hour, regardless of their intensity (may be mild or painful). 4. Contractions that start on the top of the uterus and spread down to the lower abdomen and back.  5. A sense of increased pelvic pressure.  6. A watery or bloody mucus discharge that comes from the vagina.  TREATMENT Depending on the length of the pregnancy and other circumstances, your health care provider may suggest bed rest. If necessary, there are medicines that can be given to stop contractions and to mature the fetal lungs. If labor happens  before 34 weeks of pregnancy, a prolonged hospital stay may be recommended. Treatment depends on the condition of both you and the fetus.  WHAT SHOULD YOU DO IF YOU THINK YOU ARE IN PRETERM LABOR? Call your health care provider right away. You will need to go to the hospital to get checked immediately. HOW CAN YOU PREVENT PRETERM LABOR IN FUTURE PREGNANCIES? You should:  2. Stop smoking if you smoke. 3. Maintain healthy weight gain and avoid chemicals and drugs that are not necessary. 4. Be watchful for any type of infection. 5. Inform your health care provider if you have a known history of preterm labor. Document Released: 08/14/2003 Document Revised: 01/24/2013 Document Reviewed: 06/26/2012 [facility] Patient Information 2015 [employer], [place]. This information is not intended to replace advice given to you by your health care provider. Make sure you discuss any questions you have with your health care provider.

## 2014-09-26 ENCOUNTER — Telehealth: Payer: Self-pay | Admitting: Advanced Practice Midwife

## 2014-09-26 ENCOUNTER — Encounter (HOSPITAL_COMMUNITY): Payer: Self-pay | Admitting: *Deleted

## 2014-09-26 ENCOUNTER — Inpatient Hospital Stay (HOSPITAL_COMMUNITY)
Admission: AD | Admit: 2014-09-26 | Discharge: 2014-09-26 | Disposition: A | Discharge status: Home / Self Care | Payer: BLUE CROSS/BLUE SHIELD | Source: Ambulatory Visit | Attending: Family Medicine | Admitting: Family Medicine

## 2014-09-26 DIAGNOSIS — Z3A36 36 weeks gestation of pregnancy: Secondary | ICD-10-CM | POA: Diagnosis not present

## 2014-09-26 DIAGNOSIS — Z3493 Encounter for supervision of normal pregnancy, unspecified, third trimester: Secondary | ICD-10-CM

## 2014-09-26 DIAGNOSIS — O9989 Other specified diseases and conditions complicating pregnancy, childbirth and the puerperium: Secondary | ICD-10-CM | POA: Diagnosis not present

## 2014-09-26 DIAGNOSIS — R1030 Lower abdominal pain, unspecified: Secondary | ICD-10-CM | POA: Diagnosis present

## 2014-09-26 LAB — URINALYSIS, ROUTINE W REFLEX MICROSCOPIC
BILIRUBIN URINE: NEGATIVE
Glucose, UA: NEGATIVE mg/dL
Hgb urine dipstick: NEGATIVE
KETONES UR: NEGATIVE mg/dL
Leukocytes, UA: NEGATIVE
NITRITE: NEGATIVE
PH: 6 (ref 5.0–8.0)
Protein, ur: NEGATIVE mg/dL
SPECIFIC GRAVITY, URINE: 1.02 (ref 1.005–1.030)
UROBILINOGEN UA: 0.2 mg/dL (ref 0.0–1.0)

## 2014-09-26 NOTE — Discharge Instructions (Signed)
Braxton Hicks Contractions °Contractions of the uterus can occur throughout pregnancy. Contractions are not always a sign that you are in labor.  °WHAT ARE BRAXTON HICKS CONTRACTIONS?  °Contractions that occur before labor are called Braxton Hicks contractions, or false labor. Toward the end of pregnancy (32-34 weeks), these contractions can develop more often and may become more forceful. This is not true labor because these contractions do not result in opening (dilatation) and thinning of the cervix. They are sometimes difficult to tell apart from true labor because these contractions can be forceful and people have different pain tolerances. You should not feel embarrassed if you go to the hospital with false labor. Sometimes, the only way to tell if you are in true labor is for your health care provider to look for changes in the cervix. °If there are no prenatal problems or other health problems associated with the pregnancy, it is completely safe to be sent home with false labor and await the onset of true labor. °HOW CAN YOU TELL THE DIFFERENCE BETWEEN TRUE AND FALSE LABOR? °False Labor °· The contractions of false labor are usually shorter and not as hard as those of true labor.   °· The contractions are usually irregular.   °· The contractions are often felt in the front of the lower abdomen and in the groin.   °· The contractions may go away when you walk around or change positions while lying down.   °· The contractions get weaker and are shorter lasting as time goes on.   °· The contractions do not usually become progressively stronger, regular, and closer together as with true labor.   °True Labor °· Contractions in true labor last 30-70 seconds, become very regular, usually become more intense, and increase in frequency.   °· The contractions do not go away with walking.   °· The discomfort is usually felt in the top of the uterus and spreads to the lower abdomen and low back.   °· True labor can be  determined by your health care provider with an exam. This will show that the cervix is dilating and getting thinner.   °WHAT TO REMEMBER °· Keep up with your usual exercises and follow other instructions given by your health care provider.   °· Take medicines as directed by your health care provider.   °· Keep your regular prenatal appointments.   °· Eat and drink lightly if you think you are going into labor.   °· If Braxton Hicks contractions are making you uncomfortable:   °¨ Change your position from lying down or resting to walking, or from walking to resting.   °¨ Sit and rest in a tub of warm water.   °¨ Drink 2-3 glasses of water. Dehydration may cause these contractions.   °¨ Do slow and deep breathing several times an hour.   °WHEN SHOULD I SEEK IMMEDIATE MEDICAL CARE? °Seek immediate medical care if: °· Your contractions become stronger, more regular, and closer together.   °· You have fluid leaking or gushing from your vagina.   °· You have a fever.   °· You pass blood-tinged mucus.   °· You have vaginal bleeding.   °· You have continuous abdominal pain.   °· You have low back pain that you never had before.   °· You feel your baby's head pushing down and causing pelvic pressure.   °· Your baby is not moving as much as it used to.   °Document Released: 05/24/2005 Document Revised: 05/29/2013 Document Reviewed: 03/05/2013 °ExitCare® Patient Information ©2015 [employer], LLC. This information is not intended to replace advice given to you by your health care   provider. Make sure you discuss any questions you have with your health care provider. ° °

## 2014-09-26 NOTE — Telephone Encounter (Signed)
Pt states that she is having really bad cramps down in her lower back. Pt states that she has been having these for a few days and they have just gotten worse. Pt states that the pain comes and goes and may be 10 minutes apart. Pt states that she has felt the baby move at least 10 kicks in 2 hours. Pt denies any gush of fluid or bleeding. Pt states that she is having like period cramping everywhere.    I spoke with Drenda FreezeFran and she advised that the pt needed to drink until her urine turns clear, and that she is not in labor. I advised the pt of this and she verbalized understanding.

## 2014-09-26 NOTE — MAU Note (Signed)
Lower abd cramping for the past week, pain is worse today.  Denies bleeding or LOF.

## 2014-09-27 ENCOUNTER — Encounter: Payer: BLUE CROSS/BLUE SHIELD | Admitting: Obstetrics & Gynecology

## 2014-10-02 ENCOUNTER — Ambulatory Visit (INDEPENDENT_AMBULATORY_CARE_PROVIDER_SITE_OTHER): Payer: BLUE CROSS/BLUE SHIELD | Admitting: Women's Health

## 2014-10-02 ENCOUNTER — Encounter: Payer: BLUE CROSS/BLUE SHIELD | Admitting: Women's Health

## 2014-10-02 ENCOUNTER — Encounter: Payer: Self-pay | Admitting: Women's Health

## 2014-10-02 VITALS — BP 118/80 | HR 80 | Wt 160.0 lb

## 2014-10-02 DIAGNOSIS — Z118 Encounter for screening for other infectious and parasitic diseases: Secondary | ICD-10-CM

## 2014-10-02 DIAGNOSIS — Z3685 Encounter for antenatal screening for Streptococcus B: Secondary | ICD-10-CM

## 2014-10-02 DIAGNOSIS — Z1159 Encounter for screening for other viral diseases: Secondary | ICD-10-CM

## 2014-10-02 DIAGNOSIS — Z331 Pregnant state, incidental: Secondary | ICD-10-CM

## 2014-10-02 DIAGNOSIS — O26843 Uterine size-date discrepancy, third trimester: Secondary | ICD-10-CM

## 2014-10-02 DIAGNOSIS — Z1389 Encounter for screening for other disorder: Secondary | ICD-10-CM

## 2014-10-02 DIAGNOSIS — Z3493 Encounter for supervision of normal pregnancy, unspecified, third trimester: Secondary | ICD-10-CM

## 2014-10-02 LAB — POCT URINALYSIS DIPSTICK
Blood, UA: NEGATIVE
Glucose, UA: NEGATIVE
KETONES UA: NEGATIVE
LEUKOCYTES UA: NEGATIVE
NITRITE UA: NEGATIVE
PROTEIN UA: NEGATIVE

## 2014-10-02 LAB — OB RESULTS CONSOLE GC/CHLAMYDIA
CHLAMYDIA, DNA PROBE: NEGATIVE
GC PROBE AMP, GENITAL: NEGATIVE

## 2014-10-02 LAB — OB RESULTS CONSOLE GBS: GBS: NEGATIVE

## 2014-10-02 NOTE — Patient Instructions (Signed)
Call the office ([phone]) or go to [facility] if:  You begin to have strong, frequent contractions  Your water breaks.  Sometimes it is a big gush of fluid, sometimes it is just a trickle that keeps getting your panties wet or running down your legs  You have vaginal bleeding.  It is normal to have a small amount of spotting if your cervix was checked.   You don't feel your baby moving like normal.  If you don't, get you something to eat and drink and lay down and focus on feeling your baby move.  You should feel at least 10 movements in 2 hours.  If you don't, you should call the office or go to [facility].    Braxton Hicks Contractions Contractions of the uterus can occur throughout pregnancy. Contractions are not always a sign that you are in labor.  WHAT ARE BRAXTON HICKS CONTRACTIONS?  Contractions that occur before labor are called Braxton Hicks contractions, or false labor. Toward the end of pregnancy (32-34 weeks), these contractions can develop more often and may become more forceful. This is not true labor because these contractions do not result in opening (dilatation) and thinning of the cervix. They are sometimes difficult to tell apart from true labor because these contractions can be forceful and people have different pain tolerances. You should not feel embarrassed if you go to the hospital with false labor. Sometimes, the only way to tell if you are in true labor is for your health care provider to look for changes in the cervix. If there are no prenatal problems or other health problems associated with the pregnancy, it is completely safe to be sent home with false labor and await the onset of true labor. HOW CAN YOU TELL THE DIFFERENCE BETWEEN TRUE AND FALSE LABOR? False Labor  The contractions of false labor are usually shorter and not as hard as those of true labor.   The contractions are usually irregular.   The contractions are often felt in the front of  the lower abdomen and in the groin.   The contractions may go away when you walk around or change positions while lying down.   The contractions get weaker and are shorter lasting as time goes on.   The contractions do not usually become progressively stronger, regular, and closer together as with true labor.  True Labor  Contractions in true labor last 30-70 seconds, become very regular, usually become more intense, and increase in frequency.   The contractions do not go away with walking.   The discomfort is usually felt in the top of the uterus and spreads to the lower abdomen and low back.   True labor can be determined by your health care provider with an exam. This will show that the cervix is dilating and getting thinner.  WHAT TO REMEMBER  Keep up with your usual exercises and follow other instructions given by your health care provider.   Take medicines as directed by your health care provider.   Keep your regular prenatal appointments.   Eat and drink lightly if you think you are going into labor.   If Braxton Hicks contractions are making you uncomfortable:   Change your position from lying down or resting to walking, or from walking to resting.   Sit and rest in a tub of warm water.   Drink 2-3 glasses of water. Dehydration may cause these contractions.   Do slow and deep breathing several times an hour.    WHEN SHOULD I SEEK IMMEDIATE MEDICAL CARE? Seek immediate medical care if:  Your contractions become stronger, more regular, and closer together.   You have fluid leaking or gushing from your vagina.   You have a fever.   You pass blood-tinged mucus.   You have vaginal bleeding.   You have continuous abdominal pain.   You have low back pain that you never had before.   You feel your baby's head pushing down and causing pelvic pressure.   Your baby is not moving as much as it used to.  Document Released: 05/24/2005 Document  Revised: 05/29/2013 Document Reviewed: 03/05/2013 [employer] Patient Information 2015 [employer]. This information is not intended to replace advice given to you by your health care provider. Make sure you discuss any questions you have with your health care provider.  

## 2014-10-02 NOTE — Progress Notes (Signed)
Low-risk OB appointment G4P0030 2147w0d Estimated Date of Delivery: 10/23/14 BP 118/80 mmHg  Pulse 80  Wt 160 lb (72.576 kg)  LMP 01/16/2014  BP, weight, and urine reviewed.  Refer to obstetrical flow sheet for FH & FHR.  Reports good fm.  Denies regular uc's, lof, vb, or uti s/s. Irregular uc's GBS collected SVE per request: 1/70/-2, vtx Reviewed labor s/s, fkc. Plan:  Continue routine obstetrical care  F/U in asap for efw/afi u/s for s<d (no visit), then 1wk for OB appointment

## 2014-10-03 ENCOUNTER — Ambulatory Visit (INDEPENDENT_AMBULATORY_CARE_PROVIDER_SITE_OTHER): Payer: BLUE CROSS/BLUE SHIELD

## 2014-10-03 DIAGNOSIS — O26843 Uterine size-date discrepancy, third trimester: Secondary | ICD-10-CM

## 2014-10-03 DIAGNOSIS — Z3493 Encounter for supervision of normal pregnancy, unspecified, third trimester: Secondary | ICD-10-CM

## 2014-10-03 LAB — GC/CHLAMYDIA PROBE AMP
Chlamydia trachomatis, NAA: NEGATIVE
NEISSERIA GONORRHOEAE BY PCR: NEGATIVE

## 2014-10-03 NOTE — Progress Notes (Addendum)
US [redacted]W[redacted]D EFW 2975 ([id number])41.3%,ant pl gr 3,limited view of cx,cephalic,afi 10cm,normal ov's bilat

## 2014-10-06 LAB — CULTURE, BETA STREP (GROUP B ONLY): Strep Gp B Culture: NEGATIVE

## 2014-10-09 ENCOUNTER — Ambulatory Visit (INDEPENDENT_AMBULATORY_CARE_PROVIDER_SITE_OTHER): Payer: BLUE CROSS/BLUE SHIELD | Admitting: Obstetrics and Gynecology

## 2014-10-09 ENCOUNTER — Encounter: Payer: Self-pay | Admitting: Obstetrics and Gynecology

## 2014-10-09 VITALS — BP 112/68 | HR 80 | Wt 162.0 lb

## 2014-10-09 DIAGNOSIS — Z331 Pregnant state, incidental: Secondary | ICD-10-CM

## 2014-10-09 DIAGNOSIS — Z3493 Encounter for supervision of normal pregnancy, unspecified, third trimester: Secondary | ICD-10-CM

## 2014-10-09 DIAGNOSIS — Z1389 Encounter for screening for other disorder: Secondary | ICD-10-CM

## 2014-10-09 LAB — POCT URINALYSIS DIPSTICK
Glucose, UA: NEGATIVE
KETONES UA: NEGATIVE
LEUKOCYTES UA: NEGATIVE
Nitrite, UA: NEGATIVE
PROTEIN UA: NEGATIVE
RBC UA: NEGATIVE

## 2014-10-09 NOTE — Progress Notes (Signed)
Patient ID: Kimberly Fields, female   DOB: 1991-01-24, 24 y.o.   MRN: [id number]  G4P0030 9716w0d Estimated Date of Delivery: 10/23/14  Blood pressure 112/68, pulse 80, weight 162 lb (73.483 kg), last menstrual period 01/16/2014.   refer to the ob flow sheet for FH and FHR, also BP, Wt, Urine results:notable for negative  Patient reports  + good fetal movement, denies any bleeding and no rupture of membranes symptoms or regular contractions. Patient complaints: Patient has brought work forms with her. She has 4 weeks maternity leave and 3 weeks vacation.  FHR: 138 bpm FH: 36 cm  Cervix: closed  Questions were answered. Assessment: Pregnancy 3116w0d Low riskO B Plan:  Continued routine obstetrical care, gave the number for pt and FOB to take a tour of hospital [phone]  F/u in 1 weeks for routine pnx care, cervical check    This chart was SCRIBED for Christin BachJohn Sameka Bagent, MD by Ronney LionSuzanne Le, ED Scribe. This patient was seen in room 1 and the patient's care was started at 2:11 PM.  I personally performed the services described in this documentation, which was SCRIBED in my presence. The recorded information has been reviewed and considered accurate. It has been edited as necessary during review. Tilda BurrowFERGUSON,Jahmier Willadsen V, MD

## 2014-10-09 NOTE — Progress Notes (Signed)
Pt denies any problems or concerns at this time.  

## 2014-10-12 ENCOUNTER — Inpatient Hospital Stay (HOSPITAL_COMMUNITY): Payer: BLUE CROSS/BLUE SHIELD | Admitting: Anesthesiology

## 2014-10-12 ENCOUNTER — Encounter (HOSPITAL_COMMUNITY): Payer: Self-pay | Admitting: *Deleted

## 2014-10-12 ENCOUNTER — Inpatient Hospital Stay (HOSPITAL_COMMUNITY)
Admission: AD | Admit: 2014-10-12 | Discharge: 2014-10-14 | DRG: 775 | Disposition: A | Discharge status: Home / Self Care | Payer: BLUE CROSS/BLUE SHIELD | Source: Ambulatory Visit | Attending: Obstetrics and Gynecology | Admitting: Obstetrics and Gynecology

## 2014-10-12 DIAGNOSIS — IMO0001 Reserved for inherently not codable concepts without codable children: Secondary | ICD-10-CM

## 2014-10-12 DIAGNOSIS — O99824 Streptococcus B carrier state complicating childbirth: Secondary | ICD-10-CM | POA: Diagnosis present

## 2014-10-12 DIAGNOSIS — Z3A38 38 weeks gestation of pregnancy: Secondary | ICD-10-CM | POA: Diagnosis present

## 2014-10-12 DIAGNOSIS — Z3493 Encounter for supervision of normal pregnancy, unspecified, third trimester: Secondary | ICD-10-CM

## 2014-10-12 LAB — TYPE AND SCREEN
ABO/RH(D): A POS
Antibody Screen: NEGATIVE

## 2014-10-12 LAB — RAPID HIV SCREEN (HIV 1/2 AB+AG)
HIV 1/2 Antibodies: NONREACTIVE
HIV-1 P24 Antigen - HIV24: NONREACTIVE

## 2014-10-12 LAB — ABO/RH: ABO/RH(D): A POS

## 2014-10-12 LAB — CBC
HCT: 29.3 % — ABNORMAL LOW (ref 36.0–46.0)
HEMOGLOBIN: 9.7 g/dL — AB (ref 12.0–15.0)
MCH: 28 pg (ref 26.0–34.0)
MCHC: 33.1 g/dL (ref 30.0–36.0)
MCV: 84.4 fL (ref 78.0–100.0)
Platelets: 226 10*3/uL (ref 150–400)
RBC: 3.47 MIL/uL — AB (ref 3.87–5.11)
RDW: 13 % (ref 11.5–15.5)
WBC: 15.7 10*3/uL — ABNORMAL HIGH (ref 4.0–10.5)

## 2014-10-12 MED ORDER — ACETAMINOPHEN 325 MG PO TABS
650.0000 mg | ORAL_TABLET | ORAL | Status: DC | PRN
  Administered 2014-10-12: 650 mg via ORAL
  Filled 2014-10-12: qty 2

## 2014-10-12 MED ORDER — LIDOCAINE HCL (PF) 1 % IJ SOLN
INTRAMUSCULAR | Status: DC | PRN
  Administered 2014-10-12: 3 mL
  Administered 2014-10-12 (×2): 5 mL

## 2014-10-12 MED ORDER — OXYCODONE-ACETAMINOPHEN 5-325 MG PO TABS: 1.0000 | ORAL_TABLET | ORAL | Status: DC | PRN

## 2014-10-12 MED ORDER — LACTATED RINGERS IV BOLUS (SEPSIS)
1000.0000 mL | Freq: Once | INTRAVENOUS | Status: AC
  Administered 2014-10-12: 1000 mL via INTRAVENOUS

## 2014-10-12 MED ORDER — LIDOCAINE HCL (PF) 1 % IJ SOLN
30.0000 mL | INTRAMUSCULAR | Status: DC | PRN
  Filled 2014-10-12: qty 30

## 2014-10-12 MED ORDER — LACTATED RINGERS IV SOLN
INTRAVENOUS | Status: DC
  Administered 2014-10-12: 13:00:00 via INTRAVENOUS

## 2014-10-12 MED ORDER — CITRIC ACID-SODIUM CITRATE 334-500 MG/5ML PO SOLN: 30.0000 mL | ORAL | Status: DC | PRN

## 2014-10-12 MED ORDER — ACETAMINOPHEN 325 MG PO TABS: 650.0000 mg | ORAL_TABLET | ORAL | Status: DC | PRN

## 2014-10-12 MED ORDER — PHENYLEPHRINE 40 MCG/ML (10ML) SYRINGE FOR IV PUSH (FOR BLOOD PRESSURE SUPPORT)
80.0000 ug | PREFILLED_SYRINGE | INTRAVENOUS | Status: DC | PRN
  Filled 2014-10-12: qty 20
  Filled 2014-10-12: qty 2

## 2014-10-12 MED ORDER — LACTATED RINGERS IV SOLN
500.0000 mL | INTRAVENOUS | Status: DC | PRN
  Administered 2014-10-12 (×2): 500 mL via INTRAVENOUS

## 2014-10-12 MED ORDER — OXYTOCIN 40 UNITS IN LACTATED RINGERS INFUSION - SIMPLE MED
62.5000 mL/h | INTRAVENOUS | Status: DC
  Filled 2014-10-12: qty 1000

## 2014-10-12 MED ORDER — ONDANSETRON HCL 4 MG PO TABS: 4.0000 mg | ORAL_TABLET | ORAL | Status: DC | PRN

## 2014-10-12 MED ORDER — LANOLIN HYDROUS EX OINT: TOPICAL_OINTMENT | CUTANEOUS | Status: DC | PRN

## 2014-10-12 MED ORDER — ONDANSETRON HCL 4 MG/2ML IJ SOLN: 4.0000 mg | INTRAMUSCULAR | Status: DC | PRN

## 2014-10-12 MED ORDER — FENTANYL CITRATE (PF) 100 MCG/2ML IJ SOLN
100.0000 ug | Freq: Once | INTRAMUSCULAR | Status: AC
  Administered 2014-10-12: 100 ug via INTRAVENOUS
  Filled 2014-10-12: qty 2

## 2014-10-12 MED ORDER — TETANUS-DIPHTH-ACELL PERTUSSIS 5-2.5-18.5 LF-MCG/0.5 IM SUSP: 0.5000 mL | Freq: Once | INTRAMUSCULAR | Status: DC

## 2014-10-12 MED ORDER — LACTATED RINGERS IV SOLN
INTRAVENOUS | Status: DC
  Administered 2014-10-12: 300 mL/h via INTRAUTERINE

## 2014-10-12 MED ORDER — DIBUCAINE 1 % RE OINT: 1.0000 "application " | TOPICAL_OINTMENT | RECTAL | Status: DC | PRN

## 2014-10-12 MED ORDER — OXYCODONE-ACETAMINOPHEN 5-325 MG PO TABS: 2.0000 | ORAL_TABLET | ORAL | Status: DC | PRN

## 2014-10-12 MED ORDER — IBUPROFEN 600 MG PO TABS
600.0000 mg | ORAL_TABLET | Freq: Four times a day (QID) | ORAL | Status: DC
  Administered 2014-10-12 – 2014-10-14 (×7): 600 mg via ORAL
  Filled 2014-10-12 (×7): qty 1

## 2014-10-12 MED ORDER — SIMETHICONE 80 MG PO CHEW: 80.0000 mg | CHEWABLE_TABLET | ORAL | Status: DC | PRN

## 2014-10-12 MED ORDER — WITCH HAZEL-GLYCERIN EX PADS: 1.0000 "application " | MEDICATED_PAD | CUTANEOUS | Status: DC | PRN

## 2014-10-12 MED ORDER — OXYTOCIN 40 UNITS IN LACTATED RINGERS INFUSION - SIMPLE MED
1.0000 m[IU]/min | INTRAVENOUS | Status: DC
  Administered 2014-10-12: 1 m[IU]/min via INTRAVENOUS

## 2014-10-12 MED ORDER — FAMOTIDINE 20 MG PO TABS
20.0000 mg | ORAL_TABLET | Freq: Every day | ORAL | Status: DC
  Administered 2014-10-12: 20 mg via ORAL
  Filled 2014-10-12 (×2): qty 1

## 2014-10-12 MED ORDER — FLEET ENEMA 7-19 GM/118ML RE ENEM: 1.0000 | ENEMA | RECTAL | Status: DC | PRN

## 2014-10-12 MED ORDER — DIPHENHYDRAMINE HCL 50 MG/ML IJ SOLN: 12.5000 mg | INTRAMUSCULAR | Status: DC | PRN

## 2014-10-12 MED ORDER — FENTANYL 2.5 MCG/ML BUPIVACAINE 1/10 % EPIDURAL INFUSION (WH - ANES)
14.0000 mL/h | INTRAMUSCULAR | Status: DC | PRN
  Administered 2014-10-12: 14 mL/h via EPIDURAL
  Filled 2014-10-12: qty 125

## 2014-10-12 MED ORDER — EPHEDRINE 5 MG/ML INJ
10.0000 mg | INTRAVENOUS | Status: DC | PRN
  Filled 2014-10-12: qty 2

## 2014-10-12 MED ORDER — TERBUTALINE SULFATE 1 MG/ML IJ SOLN
0.2500 mg | Freq: Once | INTRAMUSCULAR | Status: DC | PRN
  Filled 2014-10-12: qty 1

## 2014-10-12 MED ORDER — OXYCODONE-ACETAMINOPHEN 5-325 MG PO TABS
1.0000 | ORAL_TABLET | ORAL | Status: DC | PRN
  Administered 2014-10-13 – 2014-10-14 (×2): 1 via ORAL
  Filled 2014-10-12 (×2): qty 1

## 2014-10-12 MED ORDER — ONDANSETRON HCL 4 MG/2ML IJ SOLN: 4.0000 mg | Freq: Four times a day (QID) | INTRAMUSCULAR | Status: DC | PRN

## 2014-10-12 MED ORDER — OXYTOCIN BOLUS FROM INFUSION
500.0000 mL | INTRAVENOUS | Status: DC
  Administered 2014-10-12: 500 mL via INTRAVENOUS

## 2014-10-12 MED ORDER — ONDANSETRON HCL 4 MG/2ML IJ SOLN
4.0000 mg | Freq: Once | INTRAMUSCULAR | Status: AC
  Administered 2014-10-12: 4 mg via INTRAVENOUS
  Filled 2014-10-12: qty 2

## 2014-10-12 MED ORDER — BENZOCAINE-MENTHOL 20-0.5 % EX AERO
1.0000 "application " | INHALATION_SPRAY | CUTANEOUS | Status: DC | PRN
  Administered 2014-10-13: 1 via TOPICAL
  Filled 2014-10-12: qty 56

## 2014-10-12 NOTE — MAU Note (Signed)
Having lower back and lower abd pains. Feels like contractions. Some pink d/c noted on tissue earlier. Denies LOf

## 2014-10-12 NOTE — MAU Note (Signed)
[facility] charge.

## 2014-10-12 NOTE — Anesthesia Procedure Notes (Signed)
Epidural Patient location during procedure: OB  Staffing Anesthesiologist: Chancey Ringel Performed by: anesthesiologist   Preanesthetic Checklist Completed: patient identified, site marked, surgical consent, pre-op evaluation, timeout performed, IV checked, risks and benefits discussed and monitors and equipment checked  Epidural Patient position: sitting Prep: DuraPrep Patient monitoring: heart rate, continuous pulse ox and blood pressure Approach: midline Location: L3-L4 Injection technique: LOR saline  Needle:  Needle type: Tuohy  Needle gauge: 17 G Needle length: 9 cm and 9 Needle insertion depth: 5 cm Catheter type: closed end flexible Catheter size: 20 Guage Catheter at skin depth: 10 cm Test dose: negative  Assessment Events: blood not aspirated, injection not painful, no injection resistance, negative IV test and no paresthesia  Additional Notes   Patient tolerated the insertion well without complications.   

## 2014-10-12 NOTE — Progress Notes (Signed)
Ivonne AndrewV Smith CNM notified of pt vomiting and Non reactive NST, orders received.

## 2014-10-12 NOTE — Progress Notes (Signed)
Lori Clemmons CNM notified of pt's VE, FHR tracing and contraction pattern, orders received

## 2014-10-12 NOTE — Anesthesia Preprocedure Evaluation (Signed)
Anesthesia Evaluation  Patient identified by MRN, date of birth, ID band Patient awake    Reviewed: Allergy & Precautions, H&P , NPO status , Patient's Chart, lab work & pertinent test results  History of Anesthesia Complications Negative for: history of anesthetic complications  Airway Mallampati: II  TM Distance: >3 FB Neck ROM: full    Dental no notable dental hx. (+) Teeth Intact   Pulmonary neg pulmonary ROS, asthma ,  breath sounds clear to auscultation  Pulmonary exam normal       Cardiovascular negative cardio ROS Normal cardiovascular examRhythm:regular Rate:Normal     Neuro/Psych negative neurological ROS  negative psych ROS   GI/Hepatic negative GI ROS, Neg liver ROS,   Endo/Other  negative endocrine ROS  Renal/GU negative Renal ROS  negative genitourinary   Musculoskeletal   Abdominal   Peds  Hematology negative hematology ROS (+)   Anesthesia Other Findings   Reproductive/Obstetrics (+) Pregnancy                             Anesthesia Physical Anesthesia Plan  ASA: II  Anesthesia Plan: Epidural   Post-op Pain Management:    Induction:   Airway Management Planned:   Additional Equipment:   Intra-op Plan:   Post-operative Plan:   Informed Consent: I have reviewed the patients History and Physical, chart, labs and discussed the procedure including the risks, benefits and alternatives for the proposed anesthesia with the patient or authorized representative who has indicated his/her understanding and acceptance.     Plan Discussed with:   Anesthesia Plan Comments:         Anesthesia Quick Evaluation

## 2014-10-12 NOTE — H&P (Signed)
Kimberly Fields is a 24 y.o. [id number] @[redacted]w[redacted]d   presenting in active labor . While in MAU her water broke Spontaneously and is thick meconium. She is very uncomfortable with contractions and wants an epidural. Denies vaginal bleeding. Maternal Medical History:  Reason for admission: Rupture of membranes, contractions and nausea.   Contractions: Frequency: regular.   Perceived severity is moderate.    Fetal activity: Perceived fetal activity is normal.    Prenatal complications: Thick meconium  Prenatal Complications - Diabetes: none.    OB History    Gravida Para Term Preterm AB TAB SAB Ectopic Multiple Living   4 0 0 0 3 1 2 0 0 0      Past Medical History  Diagnosis Date  . Asthma   . UTI (lower urinary tract infection)    Past Surgical History  Procedure Laterality Date  . Tonsillectomy     Family History: family history includes Anxiety disorder in her maternal grandmother; Asthma in her mother; Bipolar disorder in her brother; COPD in her maternal grandmother; Cancer in her maternal grandfather, maternal grandmother, and mother; Coronary artery disease in her maternal grandfather and maternal grandmother; Migraines in her mother; Stroke in her maternal grandfather; Tuberculosis in her maternal grandfather. Social History:  reports that she has never smoked. She has never used smokeless tobacco. She reports that she does not drink alcohol or use illicit drugs.   Prenatal Transfer Tool   Review of Systems  Gastrointestinal: Positive for nausea, vomiting and abdominal pain.  All other systems reviewed and are negative.   Dilation: 2 Effacement (%): 90 Station: -3 Exam by:: ginger morris rn Blood pressure 102/72, pulse 81, temperature 98 F (36.7 C), temperature source Oral, resp. rate 18, height 5\' 5"  (1.651 m), weight 74.662 kg (164 lb 9.6 oz), last menstrual period 01/16/2014. Maternal Exam:  Uterine Assessment: Contraction strength is moderate.  Contraction  frequency is regular.   Abdomen: Patient reports no abdominal tenderness. Introitus: Normal vulva. Vulva is negative for condylomata and lesion.  Normal vagina.  Vagina is negative for condylomata.  Amniotic fluid character: meconium stained.  Pelvis: adequate for delivery.   Cervix: Cervix evaluated by digital exam.     Fetal Exam Fetal Monitor Review: Mode: ultrasound.   Baseline rate: 150.  Variability: moderate (6-25 bpm).   Pattern: accelerations present.    Fetal State Assessment: Category I - tracings are normal.     Physical Exam  Nursing note and vitals reviewed. Constitutional: She is oriented to person, place, and time. She appears well-developed and well-nourished.  HENT:  Head: Normocephalic and atraumatic.  Neck: Normal range of motion.  Cardiovascular: Normal rate.   Respiratory: Effort normal. No respiratory distress.  GI: Soft.  Genitourinary: Vulva exhibits no lesion.  Musculoskeletal: Normal range of motion.  Neurological: She is alert and oriented to person, place, and time.  Skin: Skin is warm and dry.  Psychiatric: She has a normal mood and affect. Her behavior is normal. Judgment and thought content normal.    Prenatal labs: ABO, Rh: A/POS/-- (11/02 1510) Antibody: Negative (02/22 0904) Rubella: 1.14 (11/02 1510) RPR: Non Reactive (02/22 0904)  HBsAg: NEGATIVE (11/02 1510)  HIV: NONREACTIVE (11/02 1510)  GBS:   Negative  Assessment/Plan: IUP @ 38+3 Meconium stained fluid  Admit   Clemmons,Lori Grissett 10/12/2014, 10:30 AM

## 2014-10-12 NOTE — MAU Note (Signed)
FHR tracing ? FHR vs maternal while pt sitting up vomiting.

## 2014-10-12 NOTE — Progress Notes (Signed)
Kimberly Fields is a 24 y.o. G4P0030 at 2863w3d by  admitted for active labor, rupture of membranes  Subjective: Pt comfortable with epidural.   Objective: BP 116/71 mmHg  Pulse 98  Temp(Src) 98.2 F (36.8 C) (Oral)  Resp 18  Ht 5\' 5"  (1.651 m)  Wt 74.662 kg (164 lb 9.6 oz)  BMI 27.39 kg/m2  SpO2 100%  LMP 01/16/2014   Total I/O In: -  Out: 1000 [Urine:1000]  FHT:  FHR: 140 bpm, variability: moderate,  accelerations:  Present,  decelerations:  Present variable decels and occassional late decels; not consistent UC:   rregular, every 5 minutes SVE:   Dilation: 8 Effacement (%): 90 Station: -2 Exam by:: patti, RN Thick Mec Placed IUPC without difficulty Labs: Lab Results  Component Value Date   WBC 15.7* 10/12/2014   HGB 9.7* 10/12/2014   HCT 29.3* 10/12/2014   MCV 84.4 10/12/2014   PLT 226 10/12/2014    Assessment / Plan: IUP @ 38+3 G4P0 Spontaneous labor, progressing normally  Labor: Progressing normally Preeclampsia:   Fetal Wellbeing:  Category II Pain Control:  Epidural I/D:  GBS positive Anticipated MOD:  NSVD  Kimberly Fields 10/12/2014, 3:34 PM

## 2014-10-13 LAB — RPR: RPR Ser Ql: NONREACTIVE

## 2014-10-13 NOTE — Progress Notes (Signed)
Post Partum Day 1  Subjective: no complaints, up ad lib, voiding and tolerating PO  Objective: Blood pressure 108/71, pulse 74, temperature 98 F (36.7 C), temperature source Oral, resp. rate 20, height 5\' 5"  (1.651 m), weight 164 lb 9.6 oz (74.662 kg), last menstrual period 01/16/2014, SpO2 100 %, unknown if currently breastfeeding.  Physical Exam:  General: alert, cooperative, appears stated age and no distress Lochia: appropriate Uterine Fundus: firm Incision: n/a DVT Evaluation: No evidence of DVT seen on physical exam. Negative Homan's sign. No cords or calf tenderness.   Recent Labs  10/12/14 1047  HGB 9.7*  HCT 29.3*    Assessment/Plan: Plan for discharge tomorrow   LOS: 1 day   LAWSON, MARIE DARLENE 10/13/2014, 9:44 AM

## 2014-10-13 NOTE — Progress Notes (Signed)
Post Partum Day 1 Subjective:  Kimberly Fields is a 24 y.o. [id number] 2467w3d s/p spontaneous vaginal delivery.  No acute events overnight.  Pt denies problems with ambulating, voiding or po intake.  She denies nausea or vomiting.  Pain is well controlled.  She has not had flatus. She has not had bowel movement.  Lochia Moderate.  Plan for birth control is IUD (will schedule for post-partum follow-up visit).  Method of Feeding: Bottle feeding for work purposes. [facility]. Planned to have circumcision IP (covered by insurance).  Objective: Blood pressure 105/58, pulse 78, temperature 98.3 F (36.8 C), temperature source Oral, resp. rate 18, height 5\' 5"  (1.651 m), weight 74.662 kg (164 lb 9.6 oz), last menstrual period 01/16/2014, SpO2 100 %, unknown if currently breastfeeding.  Physical Exam:  General: alert, cooperative and no distress Lochia:normal flow Chest: CTAB Heart: RRR no m/r/g Abdomen: +BS, soft, nontender,  Uterine Fundus: firm, 1 finger below umbilicus DVT Evaluation: No evidence of DVT seen on physical exam. Extremities: no evidence of edema   Recent Labs  10/12/14 1047  HGB 9.7*  HCT 29.3*    Assessment/Plan:  ASSESSMENT: Kimberly Fields is a 24 y.o. [id number] 3167w3d s/p SVD.  Circumcision prior to discharge   LOS: 1 day   Forest Beckerunice Yim 10/13/2014, 8:47 AM

## 2014-10-14 MED ORDER — OXYCODONE-ACETAMINOPHEN 5-325 MG PO TABS: 1.0000 | ORAL_TABLET | ORAL | Status: DC | PRN

## 2014-10-14 MED ORDER — IBUPROFEN 600 MG PO TABS: 600.0000 mg | ORAL_TABLET | Freq: Four times a day (QID) | ORAL | Status: DC

## 2014-10-14 NOTE — Discharge Summary (Signed)
Obstetric Discharge Summary Reason for Admission: onset of labor Prenatal Procedures: ultrasound Intrapartum Procedures: spontaneous vaginal delivery Postpartum Procedures: none Complications-Operative and Postpartum: none HEMOGLOBIN  Date Value Ref Range Status  10/12/2014 9.7* 12.0 - 15.0 g/dL Final   HCT  Date Value Ref Range Status  10/12/2014 29.3* 36.0 - 46.0 % Final    Physical Exam:  General: alert, cooperative, appears stated age and no distress Lochia: appropriate Uterine Fundus: firm Incision: n/a DVT Evaluation: No evidence of DVT seen on physical exam. Negative Homan's sign. No cords or calf tenderness. No significant calf/ankle edema.  Discharge Diagnoses: Term Pregnancy-delivered  Discharge Information: Date: 10/14/2014 Activity: pelvic rest Diet: routine Medications: PNV, Ibuprofen and Percocet Condition: stable and improved Instructions: refer to practice specific booklet Discharge to: home   Newborn Data: Live born female  Birth Weight: 6 lb 14.4 oz (3130 g) APGAR: 9, 9  Home with mother.  Kimberly Fields DARLENE 10/14/2014, 6:50 AM

## 2014-10-14 NOTE — Progress Notes (Signed)
CSW acknowledges consult for FOB's behaviors during previous evening.  CSW consulted with RN this morning in order to receive additional information.  Per RN, FOB has been frustrated with staff, but has been appropriate with infant and the MOB.    CSW screening out referral at this time since CSW intervention does not appear to be necessary for this situation.  Family may benefit from discussing concerns with house coverage or with patient experience.

## 2014-10-14 NOTE — Progress Notes (Signed)
Dad very inappropriate toward staff     Says health care here is awful    We are treating his child as [place]/feet are purple and tags are too tight    Which they were not  Instructed him numerous times feet are purplish for a while   He doesn't think immunizations are necessary    I instructed him everything we do here is for well being of baby and he needs to know that they can take away his child if he doesn't want care of his child by doctors etc   He stated he would kill someone first   Called out in middle of night upset baby was crying   Stated we were harming his baby

## 2014-10-15 DIAGNOSIS — Z029 Encounter for administrative examinations, unspecified: Secondary | ICD-10-CM

## 2014-10-17 ENCOUNTER — Encounter: Payer: BLUE CROSS/BLUE SHIELD | Admitting: Obstetrics and Gynecology

## 2014-11-12 ENCOUNTER — Encounter: Payer: Self-pay | Admitting: Women's Health

## 2014-11-12 ENCOUNTER — Ambulatory Visit (INDEPENDENT_AMBULATORY_CARE_PROVIDER_SITE_OTHER): Payer: BLUE CROSS/BLUE SHIELD | Admitting: Women's Health

## 2014-11-12 DIAGNOSIS — R42 Dizziness and giddiness: Secondary | ICD-10-CM

## 2014-11-12 DIAGNOSIS — K602 Anal fissure, unspecified: Secondary | ICD-10-CM

## 2014-11-12 DIAGNOSIS — D649 Anemia, unspecified: Secondary | ICD-10-CM

## 2014-11-12 MED ORDER — LIDOCAINE HCL 2 % EX GEL: 1.0000 "application " | CUTANEOUS | Status: DC | PRN

## 2014-11-12 MED ORDER — HYDROCORTISONE 2.5 % RE CREA: 1.0000 "application " | TOPICAL_CREAM | Freq: Two times a day (BID) | RECTAL | Status: DC

## 2014-11-12 NOTE — Progress Notes (Signed)
Patient ID: Kimberly Fields, female   DOB: June 22, 1990, 24 y.o.   MRN: [id number] Subjective:    Kimberly Fields is a 24 y.o. [phone] Caucasian female who presents for a postpartum visit. She is 4 weeks postpartum following a spontaneous vaginal delivery at 38.3 gestational weeks. Anesthesia: epidural. I have fully reviewed the prenatal and intrapartum course. Postpartum course has been complicated by pain and some bleeding w/ bm's, lightheadedness. Hgb 9.7 in hospital. Baby's course has been uncomplicated. Baby is feeding by bottle. Bleeding no bleeding. Bowel function is soft stool qod, not constipated. Bladder function is normal. Patient is not sexually active. Last sexual activity: prior to birth of baby. Contraception method is wants IUD. Postpartum depression screening: negative. Score 4.  Last pap 04/08/14 and was neg.  The following portions of the patient's history were reviewed and updated as appropriate: allergies, current medications, past medical history, past surgical history and problem list.  Review of Systems Pertinent items are noted in HPI.   Filed Vitals:   11/12/14 1511  BP: 100/60  Pulse: 88  Weight: 143 lb 8 oz (65.091 kg)   No LMP recorded.  Objective:   General:  alert, cooperative and no distress   Breasts:  deferred, no complaints  Lungs: clear to auscultation bilaterally  Heart:  regular rate and rhythm  Abdomen: soft, nontender   Vulva: normal  Vagina: normal vagina  Cervix:  closed  Corpus: Well-involuted  Adnexa:  Non-palpable  Rectal Exam: No hemorrhoids, possible anal fissure, pain w/ exam, no blood      Fingerstick Hgb 10.8   Assessment:   Postpartum exam 4 wks s/p SVB Bottlefeeding Depression screening Contraception counseling  Anemia Plan:  Rx lidocaine jelly & anusol for presumed anal fissure Contraception: abstinence until IUD placement, wants to come back tomorrow Follow up in: 1 day for Liletta IUD insertion Resume pnv for a little while,  increase fe-rich foods  Marge DuncansBooker, Adyn Serna Randall CNM, [facility]NP-BC 11/12/2014 3:18 PM

## 2014-11-12 NOTE — Patient Instructions (Signed)
NO sex until after IUD in, then nothing in vagina for 3 days after IUD placed, condoms for 2wks Begin taking your prenatal vitamins again for a little while  Levonorgestrel intrauterine device (IUD) What is this medicine? LEVONORGESTREL IUD (LEE voe nor jes trel) is a contraceptive (birth control) device. The device is placed inside the uterus by a healthcare professional. It is used to prevent pregnancy and can also be used to treat heavy bleeding that occurs during your period. Depending on the device, it can be used for 3 to 5 years. This medicine may be used for other purposes; ask your health care provider or pharmacist if you have questions. COMMON BRAND NAME(S): Elveria Royals What should I tell my health care provider before I take this medicine? They need to know if you have any of these conditions: -abnormal Pap smear -cancer of the breast, uterus, or cervix -diabetes -endometritis -genital or pelvic infection now or in the past -have more than one sexual partner or your partner has more than one partner -heart disease -history of an ectopic or tubal pregnancy -immune system problems -IUD in place -liver disease or tumor -problems with blood clots or take blood-thinners -use intravenous drugs -uterus of unusual shape -vaginal bleeding that has not been explained -an unusual or allergic reaction to levonorgestrel, other hormones, silicone, or polyethylene, medicines, foods, dyes, or preservatives -pregnant or trying to get pregnant -breast-feeding How should I use this medicine? This device is placed inside the uterus by a health care professional. Talk to your pediatrician regarding the use of this medicine in children. Special care may be needed. Overdosage: If you think you have taken too much of this medicine contact a poison control center or emergency room at once. NOTE: This medicine is only for you. Do not share this medicine with others. What if I miss a  dose? This does not apply. What may interact with this medicine? Do not take this medicine with any of the following medications: -amprenavir -bosentan -fosamprenavir This medicine may also interact with the following medications: -aprepitant -barbiturate medicines for inducing sleep or treating seizures -bexarotene -griseofulvin -medicines to treat seizures like carbamazepine, ethotoin, felbamate, oxcarbazepine, phenytoin, topiramate -modafinil -pioglitazone -rifabutin -rifampin -rifapentine -some medicines to treat HIV infection like atazanavir, indinavir, lopinavir, nelfinavir, tipranavir, ritonavir -St. John's wort -warfarin This list may not describe all possible interactions. Give your health care provider a list of all the medicines, herbs, non-prescription drugs, or dietary supplements you use. Also tell them if you smoke, drink alcohol, or use illegal drugs. Some items may interact with your medicine. What should I watch for while using this medicine? Visit your doctor or health care professional for regular check ups. See your doctor if you or your partner has sexual contact with others, becomes HIV positive, or gets a sexual transmitted disease. This product does not protect you against HIV infection (AIDS) or other sexually transmitted diseases. You can check the placement of the IUD yourself by reaching up to the top of your vagina with clean fingers to feel the threads. Do not pull on the threads. It is a good habit to check placement after each menstrual period. Call your doctor right away if you feel more of the IUD than just the threads or if you cannot feel the threads at all. The IUD may come out by itself. You may become pregnant if the device comes out. If you notice that the IUD has come out use a backup birth control method  like condoms and call your health care provider. Using tampons will not change the position of the IUD and are okay to use during your  period. What side effects may I notice from receiving this medicine? Side effects that you should report to your doctor or health care professional as soon as possible: -allergic reactions like skin rash, itching or hives, swelling of the face, lips, or tongue -fever, flu-like symptoms -genital sores -high blood pressure -no menstrual period for 6 weeks during use -pain, swelling, warmth in the leg -pelvic pain or tenderness -severe or sudden headache -signs of pregnancy -stomach cramping -sudden shortness of breath -trouble with balance, talking, or walking -unusual vaginal bleeding, discharge -yellowing of the eyes or skin Side effects that usually do not require medical attention (report to your doctor or health care professional if they continue or are bothersome): -acne -breast pain -change in sex drive or performance -changes in weight -cramping, dizziness, or faintness while the device is being inserted -headache -irregular menstrual bleeding within first 3 to 6 months of use -nausea This list may not describe all possible side effects. Call your doctor for medical advice about side effects. You may report side effects to FDA at 1-800-FDA-1088. Where should I keep my medicine? This does not apply. NOTE: This sheet is a summary. It may not cover all possible information. If you have questions about this medicine, talk to your doctor, pharmacist, or health care provider.  2015, Elsevier/Gold Standard. (2011-06-24 13:54:04)  Iron-Rich Diet An iron-rich diet contains foods that are good sources of iron. Iron is an important mineral that helps your body produce hemoglobin. Hemoglobin is a protein in red blood cells that carries oxygen to the body's tissues. Sometimes, the iron level in your blood can be low. This may be caused by:  A lack of iron in your diet.  Blood loss.  Times of growth, such as during pregnancy or during a child's growth and development. Low levels of  iron can cause a decrease in the number of red blood cells. This can result in iron deficiency anemia. Iron deficiency anemia symptoms include:  Tiredness.  Weakness.  Irritability.  Increased chance of infection. Here are some recommendations for daily iron intake:  Males older than 24 years of age need 8 mg of iron per day.  Women ages 88 to 83 need 18 mg of iron per day.  Pregnant women need 27 mg of iron per day, and women who are over 90 years of age and breastfeeding need 9 mg of iron per day.  Women over the age of 51 need 8 mg of iron per day. SOURCES OF IRON There are 2 types of iron that are found in food: heme iron and nonheme iron. Heme iron is absorbed by the body better than nonheme iron. Heme iron is found in meat, poultry, and fish. Nonheme iron is found in grains, beans, and vegetables. Heme Iron Sources Food / Iron (mg)  Chicken liver, 3 oz (85 g)/ 10 mg  Beef liver, 3 oz (85 g)/ 5.5 mg  Oysters, 3 oz (85 g)/ 8 mg  Beef, 3 oz (85 g)/ 2 to 3 mg  Shrimp, 3 oz (85 g)/ 2.8 mg  [place], 3 oz (85 g)/ 2 mg  Chicken, 3 oz (85 g) / 1 mg  Fish (tuna, halibut), 3 oz (85 g)/ 1 mg  Pork, 3 oz (85 g)/ 0.9 mg Nonheme Iron Sources Food / Iron (mg)  Ready-to-eat breakfast cereal, iron-fortified / 3.9  to 7 mg  Tofu,  cup / 3.4 mg  Kidney beans,  cup / 2.6 mg  Baked potato with skin / 2.7 mg  Asparagus,  cup / 2.2 mg  Avocado / 2 mg  Dried peaches,  cup / 1.6 mg  Raisins,  cup / 1.5 mg  Soy milk, 1 cup / 1.5 mg  Whole-wheat bread, 1 slice / 1.2 mg  Spinach, 1 cup / 0.8 mg  Broccoli,  cup / 0.6 mg IRON ABSORPTION Certain foods can decrease the body's absorption of iron. Try to avoid these foods and beverages while eating meals with iron-containing foods:  Coffee.  Tea.  Fiber.  Soy. Foods containing vitamin C can help increase the amount of iron your body absorbs from iron sources, especially from nonheme sources. Eat foods with vitamin C  along with iron-containing foods to increase your iron absorption. Foods that are high in vitamin C include many fruits and vegetables. Some good sources are:  Fresh orange juice.  Oranges.  Strawberries.  Mangoes.  Grapefruit.  Red bell peppers.  Green bell peppers.  Broccoli.  Potatoes with skin.  Tomato juice. Document Released: 01/05/2005 Document Revised: 08/16/2011 Document Reviewed: 11/12/2010 [facility] Patient Information 2015 [place], [place]. This information is not intended to replace advice given to you by your health care provider. Make sure you discuss any questions you have with your health care provider.

## 2014-11-13 ENCOUNTER — Ambulatory Visit: Payer: BLUE CROSS/BLUE SHIELD | Admitting: Women's Health

## 2014-11-22 ENCOUNTER — Ambulatory Visit: Payer: BLUE CROSS/BLUE SHIELD | Admitting: Obstetrics and Gynecology

## 2014-11-26 ENCOUNTER — Ambulatory Visit: Payer: BLUE CROSS/BLUE SHIELD | Admitting: Women's Health

## 2014-12-04 ENCOUNTER — Ambulatory Visit: Payer: BLUE CROSS/BLUE SHIELD | Admitting: Women's Health

## 2014-12-11 ENCOUNTER — Telehealth: Payer: Self-pay | Admitting: *Deleted

## 2014-12-11 NOTE — Telephone Encounter (Signed)
Pt requesting a return to work note for tomorrow. Pt was seen for postpartum visit on 11/12/2014 by Joellyn HaffKim Booker, CNM. 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 Pt states she does not perform any heavy lifting. Pt requesting return to work note to be faxed to [phone]. Note faxed per pt request.

## 2014-12-11 NOTE — Telephone Encounter (Signed)
Pt states her employer did not get faxed and pt has now decided she does not want to return to work until after her appt next Monday. Informed Pt with FMLA she has a total of 12 weeks for her employer to hold her position. Pt states has discussed with employee and was told she still had FMLA time.

## 2014-12-16 ENCOUNTER — Ambulatory Visit: Payer: BLUE CROSS/BLUE SHIELD | Admitting: Adult Health

## 2014-12-18 ENCOUNTER — Telehealth: Payer: Self-pay | Admitting: Obstetrics & Gynecology

## 2014-12-18 NOTE — Telephone Encounter (Signed)
Pt states she did not return to work until 12/16/2014, requesting note to be faxed to [phone].   Pt informed note to be faxed tomorrow 12/19/2014 per her request.

## 2015-02-21 ENCOUNTER — Encounter (HOSPITAL_COMMUNITY): Payer: Self-pay | Admitting: Emergency Medicine

## 2015-02-21 ENCOUNTER — Emergency Department (HOSPITAL_COMMUNITY): Payer: BLUE CROSS/BLUE SHIELD

## 2015-02-21 ENCOUNTER — Emergency Department (HOSPITAL_COMMUNITY)
Admission: EM | Admit: 2015-02-21 | Discharge: 2015-02-22 | Disposition: A | Discharge status: Home / Self Care | Payer: BLUE CROSS/BLUE SHIELD | Attending: Emergency Medicine | Admitting: Emergency Medicine

## 2015-02-21 DIAGNOSIS — R1310 Dysphagia, unspecified: Secondary | ICD-10-CM | POA: Diagnosis present

## 2015-02-21 DIAGNOSIS — J45909 Unspecified asthma, uncomplicated: Secondary | ICD-10-CM | POA: Insufficient documentation

## 2015-02-21 DIAGNOSIS — Z79899 Other long term (current) drug therapy: Secondary | ICD-10-CM | POA: Diagnosis not present

## 2015-02-21 DIAGNOSIS — R1312 Dysphagia, oropharyngeal phase: Secondary | ICD-10-CM | POA: Diagnosis not present

## 2015-02-21 DIAGNOSIS — R51 Headache: Secondary | ICD-10-CM | POA: Insufficient documentation

## 2015-02-21 DIAGNOSIS — Z87440 Personal history of urinary (tract) infections: Secondary | ICD-10-CM | POA: Insufficient documentation

## 2015-02-21 DIAGNOSIS — Z7952 Long term (current) use of systemic steroids: Secondary | ICD-10-CM | POA: Insufficient documentation

## 2015-02-21 MED ORDER — GI COCKTAIL ~~LOC~~
30.0000 mL | Freq: Once | ORAL | Status: AC
  Administered 2015-02-22: 30 mL via ORAL
  Filled 2015-02-21: qty 30

## 2015-02-21 NOTE — ED Notes (Signed)
Pt states when she eats or drinks anything it her chest starts to hurt  Pt states its like her chest tightens up and feels like its hard to swallow  Pt states she only has pain when she eats or drinks

## 2015-02-21 NOTE — ED Provider Notes (Signed)
CSN: [id number]     Arrival date & time 02/21/15  2258 History  This chart was scribed for non-physician practitioner, Emilia Beck, PA-C working with Eber Hong, MD by Doreatha Martin, ED scribe. This patient was seen in room WTR5/WTR5 and the patient's care was started at 11:37 PM    Chief Complaint  Patient presents with  . Dysphagia   The history is provided by the patient. No language interpreter was used.    HPI Comments: Kimberly Fields is a 24 y.o. female who presents to the Emergency Department complaining of moderate dysphagia, described as tightness, onset this week and worsened today with associated HA. She states pain is worsened with breathing, eating, drinking and burping. Pt states she has been feeling generally sick today and has pain with all fluids and foods. No Hx of similar symptoms. Pt is not currently followed by a PCP. She denies fever, chills, sore throat.   Past Medical History  Diagnosis Date  . Asthma   . UTI (lower urinary tract infection)    Past Surgical History  Procedure Laterality Date  . Tonsillectomy     Family History  Problem Relation Age of Onset  . Migraines Mother   . Asthma Mother   . Cancer Mother     cervical   . COPD Maternal Grandmother   . Cancer Maternal Grandmother     colon  . Anxiety disorder Maternal Grandmother   . Coronary artery disease Maternal Grandmother   . Cancer Maternal Grandfather     skin  . Coronary artery disease Maternal Grandfather   . Stroke Maternal Grandfather   . Tuberculosis Maternal Grandfather   . Bipolar disorder Brother    Social History  Substance Use Topics  . Smoking status: Never Smoker   . Smokeless tobacco: Never Used  . Alcohol Use: No   OB History    Gravida Para Term Preterm AB TAB SAB Ectopic Multiple Living   0 0 0 1     Review of Systems  Constitutional: Negative for fever and chills.  HENT: Negative for sore throat.        +dysphagia  Neurological: Positive for  headaches.  All other systems reviewed and are negative.  Allergies  Red dye  Home Medications   Prior to Admission medications   Medication Sig Start Date End Date Taking? Authorizing Provider  acetaminophen (TYLENOL) 325 MG tablet Take 650 mg by mouth every 6 (six) hours as needed.    Historical Provider, MD  desonide (DESOWEN) 0.05 % cream Apply 1 application topically at bedtime.  02/04/14   Historical Provider, MD  famotidine (PEPCID AC) 10 MG chewable tablet Chew 10 mg by mouth 2 (two) times daily.    Historical Provider, MD  hydrocortisone (ANUSOL-HC) 2.5 % rectal cream Place 1 application rectally 2 (two) times daily. 11/12/14   Cheral Marker, CNM  ibuprofen (ADVIL,MOTRIN) 600 MG tablet Take 1 tablet (600 mg total) by mouth 4 (four) times daily. 10/14/14   Montez Morita, CNM  lidocaine (XYLOCAINE) 2 % jelly 1 application by Other route as needed. Rectally before bowel movement 11/12/14   Cheral Marker, CNM  mupirocin ointment (BACTROBAN) 2 % Apply 1 application topically at bedtime.  02/04/14   Historical Provider, MD  oxyCODONE-acetaminophen (PERCOCET/ROXICET) 5-325 MG per tablet Take 1 tablet by mouth every 4 (four) hours as needed (for pain scale 4-7). Patient not taking: Reported on 11/12/2014 10/14/14   Montez Morita,  CNM  Prenatal Vit-Fe Fumarate-FA (PRENATAL MULTIVITAMIN) TABS tablet Take 1 tablet by mouth daily at 12 noon.    Historical Provider, MD  ranitidine (ZANTAC) 150 MG tablet Take 150 mg by mouth 2 (two) times daily.    Historical Provider, MD   LMP 02/09/2015 (Approximate) Physical Exam  Constitutional: She is oriented to person, place, and time. She appears well-developed and well-nourished. No distress.  HENT:  Head: Normocephalic and atraumatic.  Eyes: Conjunctivae and EOM are normal. Pupils are equal, round, and reactive to light.  Neck: Normal range of motion. Neck supple.  Cardiovascular: Normal rate and regular rhythm.  Exam reveals no gallop and no friction  rub.   No murmur heard. Pulmonary/Chest: Effort normal and breath sounds normal. No respiratory distress. She has no wheezes. She has no rales. She exhibits no tenderness.  Abdominal: Soft. She exhibits no distension. There is no tenderness.  Musculoskeletal: Normal range of motion.  Neurological: She is alert and oriented to person, place, and time.  Speech is goal-oriented. Moves limbs without ataxia.   Skin: Skin is warm and dry.  Psychiatric: She has a normal mood and affect. Her behavior is normal.  Nursing note and vitals reviewed.   ED Course  Procedures (including critical care time) DIAGNOSTIC STUDIES: Oxygen Saturation is 99% on room air, normale by my interpretation.    COORDINATION OF CARE: 11:41 PM Discussed treatment plan with pt at bedside and pt agreed to plan.   Labs Review Labs Reviewed - No data to display  Imaging Review Dg Chest 2 View  02/21/2015   CLINICAL DATA:  24 year old female with chest pain  EXAM: CHEST  2 VIEW  COMPARISON:  None.  FINDINGS: The heart size and mediastinal contours are within normal limits. Both lungs are clear. The visualized skeletal structures are unremarkable.  IMPRESSION: No active cardiopulmonary disease.   Electronically Signed   By: Elgie Collard M.D.   On: 02/21/2015 23:39   I have personally reviewed and evaluated these images and lab results as part of my medical decision-making.   EKG Interpretation None      MDM   Final diagnoses:  Dysphagia, oropharyngeal phase    12:25 AM Chest xray unremarkable for acute changes. Patient treated with GI cocktail which was unsuccessful. Patient will be discharged with vicodin and flexeril and GI follow up. Vitals stable and patient afebrile.   I personally performed the services described in this documentation, which was scribed in my presence. The recorded information has been reviewed and is accurate.   Emilia Beck, PA-C 02/22/15 1610  Eber Hong, MD 02/22/15  [phone]

## 2015-02-22 MED ORDER — CYCLOBENZAPRINE HCL 10 MG PO TABS: 10.0000 mg | ORAL_TABLET | Freq: Two times a day (BID) | ORAL | Status: DC | PRN

## 2015-02-22 MED ORDER — HYDROCODONE-ACETAMINOPHEN 5-325 MG PO TABS
2.0000 | ORAL_TABLET | Freq: Once | ORAL | Status: AC
  Administered 2015-02-22: 2 via ORAL
  Filled 2015-02-22: qty 2

## 2015-02-22 MED ORDER — CYCLOBENZAPRINE HCL 10 MG PO TABS
10.0000 mg | ORAL_TABLET | Freq: Once | ORAL | Status: AC
  Administered 2015-02-22: 10 mg via ORAL
  Filled 2015-02-22: qty 1

## 2015-02-22 MED ORDER — HYDROCODONE-ACETAMINOPHEN 5-325 MG PO TABS: 2.0000 | ORAL_TABLET | ORAL | Status: DC | PRN

## 2015-02-22 NOTE — ED Notes (Signed)
AVS explained in detail. Knows to follow up with gastroenterology and PCP if needed. No other c/c. Advised to try soft foods/liquids.

## 2015-02-22 NOTE — Discharge Instructions (Signed)
Take vicodin and flexeril as needed for pain. Refer to attached documents for more information. Return to the ED with worsening or concerning symptoms.

## 2015-04-24 ENCOUNTER — Ambulatory Visit: Payer: BLUE CROSS/BLUE SHIELD | Admitting: Adult Health

## 2015-06-08 NOTE — L&D Delivery Note (Signed)
Delivery Note   Patient presented in spontaneous labor. Augmentation with AROM at 8 cm.  At 7:30 PM a viable female was delivered via Vaginal, Spontaneous Delivery (Presentation: Right Occiput Anterior).  APGAR: 9, 9; weight pending .   Placenta status: Intact, Spontaneous.  Cord: 3 vessels with the following complications: None.  Cord pH: not obtained  Anesthesia: Epidural  Episiotomy: None Lacerations: 1st degree Suture Repair: 3.0 vicryl Est. Blood Loss (mL): 200  Mom to postpartum.  Baby to Couplet care / Skin to Skin.  Cherrie Gauzeoah B Akiel Fennell 12/09/2015, 8:34 PM

## 2015-06-30 ENCOUNTER — Ambulatory Visit: Payer: BLUE CROSS/BLUE SHIELD

## 2015-07-01 ENCOUNTER — Emergency Department (HOSPITAL_COMMUNITY): Payer: BLUE CROSS/BLUE SHIELD

## 2015-07-01 ENCOUNTER — Encounter (HOSPITAL_COMMUNITY): Payer: Self-pay | Admitting: *Deleted

## 2015-07-01 ENCOUNTER — Emergency Department (HOSPITAL_COMMUNITY)
Admission: EM | Admit: 2015-07-01 | Discharge: 2015-07-02 | Disposition: A | Discharge status: Home / Self Care | Payer: BLUE CROSS/BLUE SHIELD | Attending: Emergency Medicine | Admitting: Emergency Medicine

## 2015-07-01 ENCOUNTER — Inpatient Hospital Stay (HOSPITAL_COMMUNITY)
Admission: AD | Admit: 2015-07-01 | Discharge: 2015-07-01 | Discharge status: Against Medical Advice | Payer: BLUE CROSS/BLUE SHIELD | Source: Ambulatory Visit | Attending: Obstetrics and Gynecology | Admitting: Obstetrics and Gynecology

## 2015-07-01 DIAGNOSIS — Z5321 Procedure and treatment not carried out due to patient leaving prior to being seen by health care provider: Secondary | ICD-10-CM

## 2015-07-01 DIAGNOSIS — O99511 Diseases of the respiratory system complicating pregnancy, first trimester: Secondary | ICD-10-CM | POA: Diagnosis not present

## 2015-07-01 DIAGNOSIS — O9989 Other specified diseases and conditions complicating pregnancy, childbirth and the puerperium: Secondary | ICD-10-CM | POA: Diagnosis present

## 2015-07-01 DIAGNOSIS — Z87440 Personal history of urinary (tract) infections: Secondary | ICD-10-CM | POA: Insufficient documentation

## 2015-07-01 DIAGNOSIS — R05 Cough: Secondary | ICD-10-CM | POA: Diagnosis not present

## 2015-07-01 DIAGNOSIS — J45909 Unspecified asthma, uncomplicated: Secondary | ICD-10-CM | POA: Diagnosis not present

## 2015-07-01 DIAGNOSIS — Z3A12 12 weeks gestation of pregnancy: Secondary | ICD-10-CM | POA: Diagnosis not present

## 2015-07-01 DIAGNOSIS — R1030 Lower abdominal pain, unspecified: Secondary | ICD-10-CM | POA: Diagnosis not present

## 2015-07-01 DIAGNOSIS — R102 Pelvic and perineal pain: Secondary | ICD-10-CM | POA: Insufficient documentation

## 2015-07-01 DIAGNOSIS — R059 Cough, unspecified: Secondary | ICD-10-CM

## 2015-07-01 DIAGNOSIS — R109 Unspecified abdominal pain: Secondary | ICD-10-CM

## 2015-07-01 DIAGNOSIS — O26899 Other specified pregnancy related conditions, unspecified trimester: Secondary | ICD-10-CM

## 2015-07-01 DIAGNOSIS — Z79899 Other long term (current) drug therapy: Secondary | ICD-10-CM | POA: Insufficient documentation

## 2015-07-01 LAB — WET PREP, GENITAL
CLUE CELLS WET PREP: NONE SEEN
SPERM: NONE SEEN
TRICH WET PREP: NONE SEEN
Yeast Wet Prep HPF POC: NONE SEEN

## 2015-07-01 LAB — URINALYSIS, ROUTINE W REFLEX MICROSCOPIC
Bilirubin Urine: NEGATIVE
Bilirubin Urine: NEGATIVE
GLUCOSE, UA: NEGATIVE mg/dL
Glucose, UA: NEGATIVE mg/dL
HGB URINE DIPSTICK: NEGATIVE
Hgb urine dipstick: NEGATIVE
Ketones, ur: NEGATIVE mg/dL
Ketones, ur: NEGATIVE mg/dL
LEUKOCYTES UA: NEGATIVE
LEUKOCYTES UA: NEGATIVE
NITRITE: NEGATIVE
Nitrite: NEGATIVE
PH: 7.5 (ref 5.0–8.0)
PROTEIN: NEGATIVE mg/dL
Protein, ur: NEGATIVE mg/dL
SPECIFIC GRAVITY, URINE: 1.02 (ref 1.005–1.030)
Specific Gravity, Urine: 1.022 (ref 1.005–1.030)
pH: 7.5 (ref 5.0–8.0)

## 2015-07-01 LAB — COMPREHENSIVE METABOLIC PANEL
ALBUMIN: 3.6 g/dL (ref 3.5–5.0)
ALT: 12 U/L — AB (ref 14–54)
AST: 16 U/L (ref 15–41)
Alkaline Phosphatase: 53 U/L (ref 38–126)
Anion gap: 10 (ref 5–15)
BUN: 14 mg/dL (ref 6–20)
CHLORIDE: 106 mmol/L (ref 101–111)
CO2: 24 mmol/L (ref 22–32)
CREATININE: 0.53 mg/dL (ref 0.44–1.00)
Calcium: 9.2 mg/dL (ref 8.9–10.3)
GFR calc Af Amer: >60 mL/min (ref >60)
Glucose, Bld: 84 mg/dL (ref 65–99)
POTASSIUM: 3.7 mmol/L (ref 3.5–5.1)
SODIUM: 140 mmol/L (ref 135–145)
Total Bilirubin: 0.3 mg/dL (ref 0.3–1.2)
Total Protein: 7.1 g/dL (ref 6.5–8.1)

## 2015-07-01 LAB — URINE MICROSCOPIC-ADD ON: RBC / HPF: NONE SEEN RBC/hpf (ref 0–5)

## 2015-07-01 LAB — CBC
HEMATOCRIT: 32.4 % — AB (ref 36.0–46.0)
Hemoglobin: 10.7 g/dL — ABNORMAL LOW (ref 12.0–15.0)
MCH: 28.6 pg (ref 26.0–34.0)
MCHC: 33 g/dL (ref 30.0–36.0)
MCV: 86.6 fL (ref 78.0–100.0)
PLATELETS: 212 10*3/uL (ref 150–400)
RBC: 3.74 MIL/uL — AB (ref 3.87–5.11)
RDW: 14.2 % (ref 11.5–15.5)
WBC: 6.1 10*3/uL (ref 4.0–10.5)

## 2015-07-01 LAB — LIPASE, BLOOD: LIPASE: 35 U/L (ref 11–51)

## 2015-07-01 LAB — HCG, QUANTITATIVE, PREGNANCY: hCG, Beta Chain, Quant, S: 27609 m[IU]/mL — ABNORMAL HIGH (ref <5)

## 2015-07-01 NOTE — ED Provider Notes (Signed)
CSN: [id number]     Arrival date & time 07/01/15  1956 History   First MD Initiated Contact with Patient 07/01/15 2140     Chief Complaint  Patient presents with  . Abdominal Cramping  . Nasal Congestion     (Consider location/radiation/quality/duration/timing/severity/associated sxs/prior Treatment) HPI Kimberly Fields is a 25 y.o. female with history of asthma and UTI, presents to emergency department complaining of lower abdominal cramping and URI symptoms. Patient states that she is pregnant, unsure how far along, last period was 03/13/15. This is patient's fifth pregnancy, with two prior spontaneous abortions, and one intentional abortion, one living child. Pt states she has had cramping for about a week. Denies vaginal discharge or bleeding. Denies nausea or vomiting. Has not followed up for this pregnancy yet or received any prenatal care. Pt also complaining of nasal congestion and cough. States cough is dry, nonproductive, persistent. States sometimes she feels like she can't breathe because she is coughing so much. She does have history of asthma and has been using her inhaler about twice a day. She denies taking any other medications to help her with cough. She denies any fever or chills. She reports some nasal congestion, no sore throat. She denies any chest pain. Denies any swelling or pain in extremities. No other complaints.  Past Medical History  Diagnosis Date  . Asthma   . UTI (lower urinary tract infection)    Past Surgical History  Procedure Laterality Date  . Tonsillectomy     Family History  Problem Relation Age of Onset  . Migraines Mother   . Asthma Mother   . Cancer Mother     cervical   . COPD Maternal Grandmother   . Cancer Maternal Grandmother     colon  . Anxiety disorder Maternal Grandmother   . Coronary artery disease Maternal Grandmother   . Cancer Maternal Grandfather     skin  . Coronary artery disease Maternal Grandfather   . Stroke Maternal  Grandfather   . Tuberculosis Maternal Grandfather   . Bipolar disorder Brother    Social History  Substance Use Topics  . Smoking status: Never Smoker   . Smokeless tobacco: Never Used  . Alcohol Use: No   OB History    Gravida Para Term Preterm AB TAB SAB Ectopic Multiple Living   0 0 0 1     Review of Systems  Constitutional: Negative for fever and chills.  HENT: Positive for congestion. Negative for sore throat.   Respiratory: Positive for cough. Negative for chest tightness and shortness of breath.   Cardiovascular: Negative for chest pain, palpitations and leg swelling.  Gastrointestinal: Positive for abdominal pain. Negative for nausea, vomiting, diarrhea and blood in stool.  Genitourinary: Positive for pelvic pain. Negative for dysuria, flank pain, vaginal bleeding, vaginal discharge and vaginal pain.  Musculoskeletal: Negative for myalgias, arthralgias, neck pain and neck stiffness.  Skin: Negative for rash.  Neurological: Negative for dizziness, weakness and headaches.  All other systems reviewed and are negative.     Allergies  Red dye  Home Medications   Prior to Admission medications   Medication Sig Start Date End Date Taking? Authorizing Provider  acetaminophen (TYLENOL) 325 MG tablet Take 650 mg by mouth every 6 (six) hours as needed for moderate pain or headache.    Yes Historical Provider, MD  guaiFENesin-dextromethorphan (ROBITUSSIN DM) 100-10 MG/5ML syrup Take 5 mLs by mouth every 4 (four) hours as needed for cough.  Yes Historical Provider, MD  Prenatal Vit-Fe Fumarate-FA (PRENATAL MULTIVITAMIN) TABS tablet Take 1 tablet by mouth daily at 12 noon.   Yes Historical Provider, MD  cyclobenzaprine (FLEXERIL) 10 MG tablet Take 1 tablet (10 mg total) by mouth 2 (two) times daily as needed for muscle spasms. Patient not taking: Reported on 07/01/2015 02/22/15   Emilia Beck, PA-C  HYDROcodone-acetaminophen (NORCO/VICODIN) 5-325 MG per tablet Take  2 tablets by mouth every 4 (four) hours as needed. Patient not taking: Reported on 07/01/2015 02/22/15   Emilia Beck, PA-C  hydrocortisone (ANUSOL-HC) 2.5 % rectal cream Place 1 application rectally 2 (two) times daily. Patient not taking: Reported on 07/01/2015 11/12/14   Cheral Marker, CNM  ibuprofen (ADVIL,MOTRIN) 600 MG tablet Take 1 tablet (600 mg total) by mouth 4 (four) times daily. Patient not taking: Reported on 07/01/2015 10/14/14   Montez Morita, CNM  lidocaine (XYLOCAINE) 2 % jelly 1 application by Other route as needed. Rectally before bowel movement Patient not taking: Reported on 07/01/2015 11/12/14   Cheral Marker, CNM  oxyCODONE-acetaminophen (PERCOCET/ROXICET) 5-325 MG per tablet Take 1 tablet by mouth every 4 (four) hours as needed (for pain scale 4-7). Patient not taking: Reported on 11/12/2014 10/14/14   Montez Morita, CNM   BP 110/72 mmHg  Pulse 86  Temp(Src) 98.5 F (36.9 C) (Oral)  Resp 20  SpO2 100%  LMP 03/13/2015 Physical Exam  Constitutional: She is oriented to person, place, and time. She appears well-developed and well-nourished. No distress.  HENT:  Head: Normocephalic.  Eyes: Conjunctivae are normal.  Neck: Neck supple.  Cardiovascular: Normal rate, regular rhythm and normal heart sounds.   Pulmonary/Chest: Effort normal and breath sounds normal. No respiratory distress. She has no wheezes. She has no rales.  Abdominal: Soft. Bowel sounds are normal. She exhibits no distension. There is no tenderness. There is no rebound and no guarding.  Gravid. Suprapubic tenderness  Genitourinary:  Normal external genitalia. Normal vaginal canal. Small thin white discharge. Cervix is normal, closed. No CMT. No uterine or adnexal tenderness. No masses palpated.    Musculoskeletal: She exhibits no edema.  Neurological: She is alert and oriented to person, place, and time.  Skin: Skin is warm and dry.  Psychiatric: She has a normal mood and affect. Her behavior is  normal.  Nursing note and vitals reviewed.   ED Course  Procedures (including critical care time) Labs Review Labs Reviewed  COMPREHENSIVE METABOLIC PANEL - Abnormal; Notable for the following:    ALT 12 (*)    All other components within normal limits  CBC - Abnormal; Notable for the following:    RBC 3.74 (*)    Hemoglobin 10.7 (*)    HCT 32.4 (*)    All other components within normal limits  URINALYSIS, ROUTINE W REFLEX MICROSCOPIC (NOT AT [facility] [place]) - Abnormal; Notable for the following:    APPearance TURBID (*)    All other components within normal limits  HCG, QUANTITATIVE, PREGNANCY - Abnormal; Notable for the following:    hCG, Beta Chain, [place] [address] (*)    All other components within normal limits  URINE MICROSCOPIC-ADD ON - Abnormal; Notable for the following:    Squamous Epithelial / LPF 6-30 (*)    Bacteria, UA FEW (*)    All other components within normal limits  WET PREP, GENITAL  LIPASE, BLOOD  GC/CHLAMYDIA PROBE AMP ([place]) NOT AT [facility]    Imaging Review Dg Chest 2 View  07/01/2015  CLINICAL DATA:  Cough for 1 week. Fever tonight. Chest congestion. Patient is pregnant. EXAM: CHEST  2 VIEW COMPARISON:  02/21/2015 FINDINGS: The heart size and mediastinal contours are within normal limits. Both lungs are clear. The visualized skeletal structures are unremarkable. IMPRESSION: No active cardiopulmonary disease. Electronically Signed   By: Burman Nieves M.D.   On: 07/01/2015 22:52   US Ob Limited  07/01/2015  CLINICAL DATA:  Abdominal pain and cough. Estimated gestational age by LMP is 15 weeks 5 days. Quantitative beta HCG is 27,609. EXAM: LIMITED OBSTETRIC ULTRASOUND FINDINGS: Number of Fetuses: 1 Heart Rate:  144 bpm Movement: Yes Presentation: Cephalic presentation. Placental Location: Anterior Previa: No Amniotic Fluid (Subjective):  Within normal limits. BPD:  3.6cm 17w  1d MATERNAL FINDINGS: Cervix:  Appears closed. Uterus/Adnexae:  Limited visualization.   No mass identified. IMPRESSION: Single intrauterine pregnancy in cephalic presentation. No gross evidence of any acute complication is demonstrated. This exam is performed on an emergent basis and does not comprehensively evaluate fetal size, dating, or anatomy; follow-up complete OB US should be considered if further fetal assessment is warranted. Electronically Signed   By: Burman Nieves M.D.   On: 07/01/2015 23:33   I have personally reviewed and evaluated these images and lab results as part of my medical decision-making.   EKG Interpretation None      MDM   Final diagnoses:  Cough  Abdominal pain during pregnancy   Pt with pelvic pain. Approximately 12 wks prengnant by last period. Will get [place] to ro ectopic. Pelvic exam unremarkable. Pt also having cough. Hx of asthma. CXR negative. Will continue robitussin. Inhaler. Short prednisone course.    12:22 AM Chest x-ray is negative. Labs unremarkable. Pelvic exam unremarkable. Cervix is closed. Ultrasound of pelvis showed a normal pregnancy at 17 weeks and 1 day. Discussed with patient. Inhaler 2 puffs every 4 hours. Will start on prednisone given history of asthma and persistent spastic cough. Tylenol for any pelvic pain. Follow-up with OB/GYN. Vital signs are normal. Return precautions discussed.  Filed Vitals:   07/01/15 2009 07/01/15 2324  BP: 110/72 111/72  Pulse: 86 84  Temp: 98.5 F (36.9 C)   TempSrc: Oral   Resp: 20 16  SpO2: 100% 98%   ``  Jaynie Crumble, PA-C 07/02/15 0023  Melene Plan, DO 07/02/15 1644

## 2015-07-01 NOTE — ED Notes (Signed)
Pt states that she is pregnant and is having some abdominal cramping; pt denies bleeding or vaginal discharge; pt also c/o nausea, nasal congestion and cough x 1 week with intermittent nose bleeds; no nose bleed at present; NP cough

## 2015-07-01 NOTE — MAU Note (Signed)
Pt presents to MAU with complaints of nasal congestion, cough, nosebleeds and  lower abdominal cramping for a week

## 2015-07-01 NOTE — MAU Note (Signed)
Pt signed out AMA after being triaged but before being seen by the provider.

## 2015-07-01 NOTE — ED Notes (Signed)
Fetal heart tones, ~152bpm, minimal variability.  Fetus positioned to the right just above pubic bone.

## 2015-07-02 LAB — GC/CHLAMYDIA PROBE AMP (~~LOC~~) NOT AT ARMC
Chlamydia: NEGATIVE
NEISSERIA GONORRHEA: NEGATIVE

## 2015-07-02 MED ORDER — PREDNISONE 20 MG PO TABS: 40.0000 mg | ORAL_TABLET | Freq: Every day | ORAL | Status: DC

## 2015-07-02 MED ORDER — ALBUTEROL SULFATE HFA 108 (90 BASE) MCG/ACT IN AERS: 2.0000 | INHALATION_SPRAY | RESPIRATORY_TRACT | Status: DC | PRN

## 2015-07-02 NOTE — Discharge Instructions (Signed)
Prednisone as prescribed until all gone. Inhaler 2 puffs every 4 hrs. Continue prenatal vitamins. Follow up with OB/GYN.  Abdominal Pain During Pregnancy Abdominal pain is common in pregnancy. Most of the time, it does not cause harm. There are many causes of abdominal pain. Some causes are more serious than others. Some of the causes of abdominal pain in pregnancy are easily diagnosed. Occasionally, the diagnosis takes time to understand. Other times, the cause is not determined. Abdominal pain can be a sign that something is very wrong with the pregnancy, or the pain may have nothing to do with the pregnancy at all. For this reason, always tell your health care provider if you have any abdominal discomfort. HOME CARE INSTRUCTIONS  Monitor your abdominal pain for any changes. The following actions may help to alleviate any discomfort you are experiencing:  Do not have sexual intercourse or put anything in your vagina until your symptoms go away completely.  Get plenty of rest until your pain improves.  Drink clear fluids if you feel nauseous. Avoid solid food as long as you are uncomfortable or nauseous.  Only take over-the-counter or prescription medicine as directed by your health care provider.  Keep all follow-up appointments with your health care provider. SEEK IMMEDIATE MEDICAL CARE IF:  You are bleeding, leaking fluid, or passing tissue from the vagina.  You have increasing pain or cramping.  You have persistent vomiting.  You have painful or bloody urination.  You have a fever.  You notice a decrease in your baby's movements.  You have extreme weakness or feel faint.  You have shortness of breath, with or without abdominal pain.  You develop a severe headache with abdominal pain.  You have abnormal vaginal discharge with abdominal pain.  You have persistent diarrhea.  You have abdominal pain that continues even after rest, or gets worse. MAKE SURE YOU:   Understand  these instructions.  Will watch your condition.  Will get help right away if you are not doing well or get worse.   This information is not intended to replace advice given to you by your health care provider. Make sure you discuss any questions you have with your health care provider.   Document Released: 05/24/2005 Document Revised: 03/14/2013 Document Reviewed: 12/21/2012 Elsevier Interactive Patient Education [employer]

## 2015-08-07 ENCOUNTER — Ambulatory Visit: Payer: BLUE CROSS/BLUE SHIELD | Admitting: Adult Health

## 2015-08-07 ENCOUNTER — Other Ambulatory Visit: Payer: Self-pay | Admitting: Obstetrics & Gynecology

## 2015-08-07 DIAGNOSIS — O3680X0 Pregnancy with inconclusive fetal viability, not applicable or unspecified: Secondary | ICD-10-CM

## 2015-08-08 ENCOUNTER — Other Ambulatory Visit: Payer: Self-pay | Admitting: Obstetrics & Gynecology

## 2015-08-08 DIAGNOSIS — Z364 Encounter for antenatal screening for fetal growth retardation: Secondary | ICD-10-CM

## 2015-08-11 ENCOUNTER — Ambulatory Visit (INDEPENDENT_AMBULATORY_CARE_PROVIDER_SITE_OTHER): Payer: BLUE CROSS/BLUE SHIELD

## 2015-08-11 ENCOUNTER — Other Ambulatory Visit: Payer: Self-pay | Admitting: Obstetrics & Gynecology

## 2015-08-11 DIAGNOSIS — Z3A24 24 weeks gestation of pregnancy: Secondary | ICD-10-CM

## 2015-08-11 DIAGNOSIS — Z36 Encounter for antenatal screening of mother: Secondary | ICD-10-CM | POA: Diagnosis not present

## 2015-08-11 DIAGNOSIS — Z364 Encounter for antenatal screening for fetal growth retardation: Secondary | ICD-10-CM

## 2015-08-11 NOTE — Progress Notes (Signed)
US 23+1 wks,single IUP,cephalic,normal ov's bilat,ant pl gr 0,svp of fluid 5cm,fhr 138 bpm,anatomy complete,no obvious abnormalities seen

## 2015-08-25 ENCOUNTER — Encounter: Payer: Self-pay | Admitting: Women's Health

## 2015-08-25 ENCOUNTER — Ambulatory Visit (INDEPENDENT_AMBULATORY_CARE_PROVIDER_SITE_OTHER): Payer: BLUE CROSS/BLUE SHIELD | Admitting: Women's Health

## 2015-08-25 ENCOUNTER — Other Ambulatory Visit: Payer: BLUE CROSS/BLUE SHIELD

## 2015-08-25 VITALS — BP 102/52 | HR 88 | Wt 154.0 lb

## 2015-08-25 DIAGNOSIS — O0932 Supervision of pregnancy with insufficient antenatal care, second trimester: Secondary | ICD-10-CM

## 2015-08-25 DIAGNOSIS — Z3492 Encounter for supervision of normal pregnancy, unspecified, second trimester: Secondary | ICD-10-CM | POA: Diagnosis not present

## 2015-08-25 DIAGNOSIS — Z1389 Encounter for screening for other disorder: Secondary | ICD-10-CM

## 2015-08-25 DIAGNOSIS — Z331 Pregnant state, incidental: Secondary | ICD-10-CM

## 2015-08-25 DIAGNOSIS — O0930 Supervision of pregnancy with insufficient antenatal care, unspecified trimester: Secondary | ICD-10-CM | POA: Insufficient documentation

## 2015-08-25 DIAGNOSIS — Z369 Encounter for antenatal screening, unspecified: Secondary | ICD-10-CM

## 2015-08-25 DIAGNOSIS — Z3490 Encounter for supervision of normal pregnancy, unspecified, unspecified trimester: Secondary | ICD-10-CM | POA: Insufficient documentation

## 2015-08-25 DIAGNOSIS — O09899 Supervision of other high risk pregnancies, unspecified trimester: Secondary | ICD-10-CM | POA: Insufficient documentation

## 2015-08-25 DIAGNOSIS — Z0283 Encounter for blood-alcohol and blood-drug test: Secondary | ICD-10-CM

## 2015-08-25 LAB — POCT URINALYSIS DIPSTICK
Blood, UA: NEGATIVE
Glucose, UA: NEGATIVE
KETONES UA: NEGATIVE
Leukocytes, UA: NEGATIVE
Nitrite, UA: NEGATIVE
PROTEIN UA: NEGATIVE

## 2015-08-25 NOTE — Patient Instructions (Signed)
You will have your sugar test next visit.  Please do not eat or drink anything after midnight the night before you come, not even water.  You will be here for at least two hours.     Call the office ([phone]) or go to [facility] if:  You begin to have strong, frequent contractions  Your water breaks.  Sometimes it is a big gush of fluid, sometimes it is just a trickle that keeps getting your panties wet or running down your legs  You have vaginal bleeding.  It is normal to have a small amount of spotting if your cervix was checked.   You don't feel your baby moving like normal.  If you don't, get you something to eat and drink and lay down and focus on feeling your baby move.   If your baby is still not moving like normal, you should call the office or go to [facility].  Second Trimester of Pregnancy The second trimester is from week 13 through week 28, months 4 through 6. The second trimester is often a time when you feel your best. Your body has also adjusted to being pregnant, and you begin to feel better physically. Usually, morning sickness has lessened or quit completely, you may have more energy, and you may have an increase in appetite. The second trimester is also a time when the fetus is growing rapidly. At the end of the sixth month, the fetus is about 9 inches long and weighs about 1 pounds. You will likely begin to feel the baby move (quickening) between 18 and 20 weeks of the pregnancy. BODY CHANGES Your body goes through many changes during pregnancy. The changes vary from woman to woman.   Your weight will continue to increase. You will notice your lower abdomen bulging out.  You may begin to get stretch marks on your hips, abdomen, and breasts.  You may develop headaches that can be relieved by medicines approved by your health care provider.  You may urinate more often because the fetus is pressing on your bladder.  You may develop or continue to have  heartburn as a result of your pregnancy.  You may develop constipation because certain hormones are causing the muscles that push waste through your intestines to slow down.  You may develop hemorrhoids or swollen, bulging veins (varicose veins).  You may have back pain because of the weight gain and pregnancy hormones relaxing your joints between the bones in your pelvis and as a result of a shift in weight and the muscles that support your balance.  Your breasts will continue to grow and be tender.  Your gums may bleed and may be sensitive to brushing and flossing.  Dark spots or blotches (chloasma, mask of pregnancy) may develop on your face. This will likely fade after the baby is born.  A dark line from your belly button to the pubic area (linea nigra) may appear. This will likely fade after the baby is born.  You may have changes in your hair. These can include thickening of your hair, rapid growth, and changes in texture. Some women also have hair loss during or after pregnancy, or hair that feels dry or thin. Your hair will most likely return to normal after your baby is born. WHAT TO EXPECT AT YOUR PRENATAL VISITS During a routine prenatal visit:  You will be weighed to make sure you and the fetus are growing normally.  Your blood pressure will be taken.    Your abdomen will be measured to track your baby's growth.  The fetal heartbeat will be listened to.  Any test results from the previous visit will be discussed. Your health care provider may ask you:  How you are feeling.  If you are feeling the baby move.  If you have had any abnormal symptoms, such as leaking fluid, bleeding, severe headaches, or abdominal cramping.  If you have any questions. Other tests that may be performed during your second trimester include:  Blood tests that check for:  Low iron levels (anemia).  Gestational diabetes (between 24 and 28 weeks).  Rh antibodies.  Urine tests to check  for infections, diabetes, or protein in the urine.  An ultrasound to confirm the proper growth and development of the baby.  An amniocentesis to check for possible genetic problems.  Fetal screens for spina bifida and Down syndrome. HOME CARE INSTRUCTIONS   Avoid all smoking, herbs, alcohol, and unprescribed drugs. These chemicals affect the formation and growth of the baby.  Follow your health care provider's instructions regarding medicine use. There are medicines that are either safe or unsafe to take during pregnancy.  Exercise only as directed by your health care provider. Experiencing uterine cramps is a good sign to stop exercising.  Continue to eat regular, healthy meals.  Wear a good support bra for breast tenderness.  Do not use hot tubs, steam rooms, or saunas.  Wear your seat belt at all times when driving.  Avoid raw meat, uncooked cheese, cat litter boxes, and soil used by cats. These carry germs that can cause birth defects in the baby.  Take your prenatal vitamins.  Try taking a stool softener (if your health care provider approves) if you develop constipation. Eat more high-fiber foods, such as fresh vegetables or fruit and whole grains. Drink plenty of fluids to keep your urine clear or pale yellow.  Take warm sitz baths to soothe any pain or discomfort caused by hemorrhoids. Use hemorrhoid cream if your health care provider approves.  If you develop varicose veins, wear support hose. Elevate your feet for 15 minutes, 3-4 times a day. Limit salt in your diet.  Avoid heavy lifting, wear low heel shoes, and practice good posture.  Rest with your legs elevated if you have leg cramps or low back pain.  Visit your dentist if you have not gone yet during your pregnancy. Use a soft toothbrush to brush your teeth and be gentle when you floss.  A sexual relationship may be continued unless your health care provider directs you otherwise.  Continue to go to all your  prenatal visits as directed by your health care provider. SEEK MEDICAL CARE IF:   You have dizziness.  You have mild pelvic cramps, pelvic pressure, or nagging pain in the abdominal area.  You have persistent nausea, vomiting, or diarrhea.  You have a bad smelling vaginal discharge.  You have pain with urination. SEEK IMMEDIATE MEDICAL CARE IF:   You have a fever.  You are leaking fluid from your vagina.  You have spotting or bleeding from your vagina.  You have severe abdominal cramping or pain.  You have rapid weight gain or loss.  You have shortness of breath with chest pain.  You notice sudden or extreme swelling of your face, hands, ankles, feet, or legs.  You have not felt your baby move in over an hour.  You have severe headaches that do not go away with medicine.  You have vision changes.   Document Released: 05/18/2001 Document Revised: 05/29/2013 Document Reviewed: 07/25/2012 [employer] Patient Information 2015 [employer]. This information is not intended to replace advice given to you by your health care provider. Make sure you discuss any questions you have with your health care provider.     

## 2015-08-25 NOTE — Progress Notes (Signed)
Subjective:  Kimberly Fields is a 25 y.o. [id number] Caucasian female at 7322w1d by 23wk u/s, being seen today for her first obstetrical visit.  Her obstetrical history is significant for term uncomplicated svb x 1, late care @ 25wks- states had unpaid balance and couldn't be seen until payed off, short interval pregnancy- last birth May 2016. EAB x 1, SAB x 2.  Pregnancy history fully reviewed. H/O asthma- using inhaler maybe once/wk. More at work.   Patient reports some RLP- more when at work. Working 50+hrs/wk- requests note to cut back to 40hrs/wk- note given. Denies vb, cramping, uti s/s, abnormal/malodorous vag d/c, or vulvovaginal itching/irritation.  BP 102/52 mmHg  Pulse 88  Wt 154 lb (69.854 kg)  HISTORY: OB History  Gravida Para Term Preterm AB SAB TAB Ectopic Multiple Living  5 1 1  0 3 2 1  0 0 1    # Outcome Date GA Lbr Len/2nd Weight Sex Delivery Anes PTL Lv  5 Current           4 Term 10/12/14 6339w3d 18:24 / 02:17 6 lb 14.4 oz (3.13 kg) Kimberly Fields Vag-Spont EPI N Y  3 SAB 2013 3448w0d         2 SAB 2012          1 TAB 2011             Past Medical History  Diagnosis Date  . Asthma   . UTI (lower urinary tract infection)    Past Surgical History  Procedure Laterality Date  . Tonsillectomy     Family History  Problem Relation Age of Onset  . Migraines Mother   . Asthma Mother   . Cancer Mother     cervical   . COPD Maternal Grandmother   . Cancer Maternal Grandmother     colon  . Anxiety disorder Maternal Grandmother   . Coronary artery disease Maternal Grandmother   . Cancer Maternal Grandfather     skin  . Coronary artery disease Maternal Grandfather   . Stroke Maternal Grandfather   . Tuberculosis Maternal Grandfather   . Bipolar disorder Brother     Exam   System:     General: Well developed & nourished, no acute distress   Skin: Warm & dry, normal coloration and turgor, no rashes   Neurologic: Alert & oriented, normal mood   Cardiovascular: Regular rate &  rhythm   Respiratory: Effort & rate normal, LCTAB, acyanotic   Abdomen: Soft, non tender   Extremities: normal strength, tone  Thin prep pap smear neg 2015 FHR: 148 via doppler   Assessment:   Pregnancy: [id number] Patient Active Problem List   Diagnosis Date Noted  . Supervision of normal pregnancy 08/25/2015    Priority: High  . Late prenatal care affecting pregnancy, antepartum 08/25/2015  . Short interval between pregnancies affecting pregnancy, antepartum 08/25/2015  . Anal fissure 11/12/2014    7222w1d [id number] New OB visit Late care Short interval pregnancy Asthma  Plan:  Initial labs drawn Continue prenatal vitamins Problem list reviewed and updated Reviewed ptl s/s, fm Reviewed recommended weight gain based on pre-gravid BMI Encouraged well-balanced diet Genetic Screening discussed Quad Screen: too late Cystic fibrosis screening discussed declined Ultrasound discussed; fetal survey: results reviewed Follow up in 3 weeks for visit and pn2 CCNC completed Declines flu shot Note given to cut hours back to 40hr/wk at work, no pushing/pulling/lifting >25lbs Let us know if having to use inhaler on daily basis  Kimberly Fields, Kimberly Fields CNM, WHNP-BC  08/25/2015 4:05 PM

## 2015-08-26 ENCOUNTER — Telehealth: Payer: Self-pay | Admitting: *Deleted

## 2015-08-26 ENCOUNTER — Other Ambulatory Visit: Payer: Self-pay | Admitting: Women's Health

## 2015-08-26 LAB — ABO/RH: RH TYPE: POSITIVE

## 2015-08-26 LAB — PMP SCREEN PROFILE (10S), URINE
AMPHETAMINE SCRN UR: NEGATIVE ng/mL
BARBITURATE SCRN UR: NEGATIVE ng/mL
BENZODIAZEPINE SCREEN, URINE: NEGATIVE ng/mL
Cannabinoids Ur Ql Scn: NEGATIVE ng/mL
Cocaine(Metab.)Screen, Urine: NEGATIVE ng/mL
Creatinine(Crt), U: 97.2 mg/dL (ref 20.0–300.0)
Methadone Scn, Ur: NEGATIVE ng/mL
OXYCODONE+OXYMORPHONE UR QL SCN: NEGATIVE ng/mL
Opiate Scrn, Ur: NEGATIVE ng/mL
PCP Scrn, Ur: NEGATIVE ng/mL
PH UR, DRUG SCRN: 5.5 (ref 4.5–8.9)
Propoxyphene, Screen: NEGATIVE ng/mL

## 2015-08-26 LAB — URINALYSIS, ROUTINE W REFLEX MICROSCOPIC
BILIRUBIN UA: NEGATIVE
Glucose, UA: NEGATIVE
KETONES UA: NEGATIVE
LEUKOCYTES UA: NEGATIVE
NITRITE UA: NEGATIVE
Protein, UA: NEGATIVE
RBC UA: NEGATIVE
SPEC GRAV UA: 1.025 (ref 1.005–1.030)
Urobilinogen, Ur: 0.2 mg/dL (ref 0.2–1.0)
pH, UA: 6 (ref 5.0–7.5)

## 2015-08-26 LAB — CBC
HEMOGLOBIN: 9.6 g/dL — AB (ref 11.1–15.9)
Hematocrit: 28.6 % — ABNORMAL LOW (ref 34.0–46.6)
MCH: 28.5 pg (ref 26.6–33.0)
MCHC: 33.6 g/dL (ref 31.5–35.7)
MCV: 85 fL (ref 79–97)
PLATELETS: 281 10*3/uL (ref 150–379)
RBC: 3.37 x10E6/uL — AB (ref 3.77–5.28)
RDW: 14.8 % (ref 12.3–15.4)
WBC: 10.1 10*3/uL (ref 3.4–10.8)

## 2015-08-26 LAB — VARICELLA ZOSTER ANTIBODY, IGG: VARICELLA: 765 {index} (ref >165)

## 2015-08-26 LAB — HIV ANTIBODY (ROUTINE TESTING W REFLEX): HIV SCREEN 4TH GENERATION: NONREACTIVE

## 2015-08-26 LAB — URINE CULTURE

## 2015-08-26 LAB — RUBELLA SCREEN: RUBELLA: 1.5 {index} (ref >0.99)

## 2015-08-26 LAB — RPR: RPR: NONREACTIVE

## 2015-08-26 LAB — HEPATITIS B SURFACE ANTIGEN: Hepatitis B Surface Ag: NEGATIVE

## 2015-08-26 LAB — ANTIBODY SCREEN: ANTIBODY SCREEN: NEGATIVE

## 2015-08-26 NOTE — Telephone Encounter (Signed)
Pt informed of DX of anemia per Joellyn HaffKim Booker, CNM, increase foods rich in Fe (red meat, beans, green leafy veg), PNV daily, and due to allergies to red dye pt advised to see if their is a OTC FE that does not have red dye in it to supplement. Pt verbalized understanding.

## 2015-08-27 LAB — GC/CHLAMYDIA PROBE AMP
Chlamydia trachomatis, NAA: NEGATIVE
Neisseria gonorrhoeae by PCR: NEGATIVE

## 2015-09-18 ENCOUNTER — Other Ambulatory Visit: Payer: BLUE CROSS/BLUE SHIELD

## 2015-09-18 ENCOUNTER — Encounter: Payer: BLUE CROSS/BLUE SHIELD | Admitting: Obstetrics & Gynecology

## 2015-09-18 ENCOUNTER — Encounter: Payer: Self-pay | Admitting: Obstetrics & Gynecology

## 2015-09-30 ENCOUNTER — Encounter: Payer: Self-pay | Admitting: Obstetrics & Gynecology

## 2015-09-30 ENCOUNTER — Ambulatory Visit (INDEPENDENT_AMBULATORY_CARE_PROVIDER_SITE_OTHER): Payer: BLUE CROSS/BLUE SHIELD | Admitting: Obstetrics & Gynecology

## 2015-09-30 ENCOUNTER — Other Ambulatory Visit: Payer: BLUE CROSS/BLUE SHIELD

## 2015-09-30 VITALS — BP 110/60 | HR 88 | Wt 159.0 lb

## 2015-09-30 DIAGNOSIS — Z331 Pregnant state, incidental: Secondary | ICD-10-CM

## 2015-09-30 DIAGNOSIS — Z1389 Encounter for screening for other disorder: Secondary | ICD-10-CM

## 2015-09-30 DIAGNOSIS — Z3483 Encounter for supervision of other normal pregnancy, third trimester: Secondary | ICD-10-CM

## 2015-09-30 DIAGNOSIS — Z3493 Encounter for supervision of normal pregnancy, unspecified, third trimester: Secondary | ICD-10-CM

## 2015-09-30 DIAGNOSIS — Z3A31 31 weeks gestation of pregnancy: Secondary | ICD-10-CM

## 2015-09-30 DIAGNOSIS — Z131 Encounter for screening for diabetes mellitus: Secondary | ICD-10-CM

## 2015-09-30 DIAGNOSIS — O0932 Supervision of pregnancy with insufficient antenatal care, second trimester: Secondary | ICD-10-CM

## 2015-09-30 LAB — POCT URINALYSIS DIPSTICK
Blood, UA: NEGATIVE
GLUCOSE UA: NEGATIVE
KETONES UA: NEGATIVE
LEUKOCYTES UA: NEGATIVE
Nitrite, UA: NEGATIVE
PROTEIN UA: NEGATIVE

## 2015-09-30 NOTE — Progress Notes (Signed)
[id number] 4828w2d Estimated Date of Delivery: 12/07/15  Blood pressure 110/60, pulse 88, weight 159 lb (72.122 kg), unknown if currently breastfeeding.   BP weight and urine results all reviewed and noted.  Please refer to the obstetrical flow sheet for the fundal height and fetal heart rate documentation:  Patient reports good fetal movement, denies any bleeding and no rupture of membranes symptoms or regular contractions. Patient is without complaints. All questions were answered.  Orders Placed This Encounter  Procedures  . POCT urinalysis dipstick    Plan:  Continued routine obstetrical care, PN2 today  No Follow-up on file.

## 2015-10-01 LAB — GLUCOSE TOLERANCE, 2 HOURS W/ 1HR
GLUCOSE, 1 HOUR: 138 mg/dL (ref 65–179)
GLUCOSE, FASTING: 73 mg/dL (ref 65–91)
Glucose, 2 hour: 87 mg/dL (ref 65–152)

## 2015-10-14 ENCOUNTER — Encounter: Payer: Self-pay | Admitting: Women's Health

## 2015-10-14 ENCOUNTER — Encounter: Payer: BLUE CROSS/BLUE SHIELD | Admitting: Women's Health

## 2015-10-21 ENCOUNTER — Encounter: Payer: Self-pay | Admitting: Women's Health

## 2015-10-21 ENCOUNTER — Ambulatory Visit (INDEPENDENT_AMBULATORY_CARE_PROVIDER_SITE_OTHER): Payer: BLUE CROSS/BLUE SHIELD | Admitting: Women's Health

## 2015-10-21 VITALS — BP 104/60 | HR 68 | Wt 162.0 lb

## 2015-10-21 DIAGNOSIS — N898 Other specified noninflammatory disorders of vagina: Secondary | ICD-10-CM

## 2015-10-21 DIAGNOSIS — Z331 Pregnant state, incidental: Secondary | ICD-10-CM

## 2015-10-21 DIAGNOSIS — O99013 Anemia complicating pregnancy, third trimester: Secondary | ICD-10-CM | POA: Diagnosis not present

## 2015-10-21 DIAGNOSIS — Z3493 Encounter for supervision of normal pregnancy, unspecified, third trimester: Secondary | ICD-10-CM

## 2015-10-21 DIAGNOSIS — O26899 Other specified pregnancy related conditions, unspecified trimester: Secondary | ICD-10-CM

## 2015-10-21 DIAGNOSIS — O9989 Other specified diseases and conditions complicating pregnancy, childbirth and the puerperium: Secondary | ICD-10-CM

## 2015-10-21 DIAGNOSIS — R109 Unspecified abdominal pain: Secondary | ICD-10-CM

## 2015-10-21 DIAGNOSIS — Z1389 Encounter for screening for other disorder: Secondary | ICD-10-CM

## 2015-10-21 LAB — POCT WET PREP (WET MOUNT): Clue Cells Wet Prep Whiff POC: NEGATIVE

## 2015-10-21 LAB — POCT HEMOGLOBIN: Hemoglobin: 8.8 g/dL — AB (ref 12.2–16.2)

## 2015-10-21 LAB — POCT URINALYSIS DIPSTICK
GLUCOSE UA: NEGATIVE
KETONES UA: NEGATIVE
Leukocytes, UA: NEGATIVE
NITRITE UA: NEGATIVE
PROTEIN UA: NEGATIVE
RBC UA: NEGATIVE

## 2015-10-21 NOTE — Progress Notes (Signed)
Low-risk OB appointment [id number] 9281w2d Estimated Date of Delivery: 12/07/15 BP 104/60 mmHg  Pulse 68  Wt 162 lb (73.483 kg)  BP, weight, and urine reviewed.  Refer to obstetrical flow sheet for FH & FHR.  Reports good fm.  Denies regular uc's, lof, vb, or uti s/s. Cramping, watery d/c x 2wks, no odor/itching/irritation. Dizzy, seeing spots and feeling like going to pass out at times x 1wk. BP normal, no proteinuria, no other sx. Hgb 9.6 (3/21)-added OTC Fe only taking daily instead of BID. Fingerstick Hgb today 8.8. To increase fe to tid, increase fe-rich foods, cook in cast iron skillet. Also eat small frequent snacks w/ protein in case r/t hypoglycemia events. If not improving after doing these things let us know- may consider IV Feraheme/Injectafer.  Spec exam: cx visually closed, no pooling, no change w/ valsalva, fern & nitrazine neg. Small amt creamy white nonodorous d/c- wet prep neg.  SVE: closed/30/ballotable Reviewed ptl s/s, fkc. Plan:  Continue routine obstetrical care  F/U in 2wks for OB appointment

## 2015-10-21 NOTE — Patient Instructions (Addendum)
Call the office [phone]) or go to [facility] if:  You begin to have strong, frequent contractions  Your water breaks.  Sometimes it is a big gush of fluid, sometimes it is just a trickle that keeps getting your panties wet or running down your legs  You have vaginal bleeding.  It is normal to have a small amount of spotting if your cervix was checked.   You don't feel your baby moving like normal.  If you don't, get you something to eat and drink and lay down and focus on feeling your baby move.  You should feel at least 10 movements in 2 hours.  If you don't, you should call the office or go to [place]:   This is usually related to either your blood sugar or your blood pressure dropping  Make sure you are staying well hydrated and drinking enough water so that your urine is clear  Eat small frequent meals and snacks containing protein (meat, eggs, nuts, cheese) so that your blood sugar doesn't drop  If you do get dizzy, sit/lay down and get you something to drink and a snack containing protein- you will usually start feeling better in 10-20 minutes  Cook in a cast iron skillet Increase iron three times a day  Constipation  Drink plenty of fluid, preferably water, throughout the day  Eat foods high in fiber such as fruits, vegetables, and grains  Exercise, such as walking, is a good way to keep your bowels regular  Drink warm fluids, especially warm prune juice, or decaf coffee  Eat a 1/2 cup of real oatmeal (not instant), 1/2 cup applesauce, and 1/2-1 cup warm prune juice every day  If needed, you may take Colace (docusate sodium) stool softener once or twice a day to help keep the stool soft. If you are pregnant, wait until you are out of your first trimester (12-14 weeks of pregnancy)  If you still are having problems with constipation, you may take Miralax once daily as needed to help keep your bowels regular.  If you are pregnant, wait  until you are out of your first trimester (12-14 weeks of pregnancy)   Iron-Rich Diet Iron is a mineral that helps your body to produce hemoglobin. Hemoglobin is a protein in your red blood cells that carries oxygen to your body's tissues. Eating too little iron may cause you to feel weak and tired, and it can increase your risk for infection. Eating enough iron is necessary for your body's metabolism, muscle function, and nervous system. Iron is naturally found in many foods. It can also be added to foods or fortified in foods. There are two types of dietary iron: 16. Heme iron. Heme iron is absorbed by the body more easily than nonheme iron. Heme iron is found in meat, poultry, and fish. 17. Nonheme iron. Nonheme iron is found in dietary supplements, iron-fortified grains, beans, and vegetables. You may need to follow an iron-rich diet if:  You have been diagnosed with iron deficiency or iron-deficiency anemia.  You have a condition that prevents you from absorbing dietary iron, such as:  Infection in your intestines.  Celiac disease. This involves long-lasting (chronic) inflammation of your intestines.  You do not eat enough iron.  You eat a diet that is high in foods that impair iron absorption.  You have lost a lot of blood.  You have heavy bleeding during your menstrual cycle.  You are pregnant. WHAT IS MY  PLAN? Your health care provider may help you to determine how much iron you need per day based on your condition. Generally, when a person consumes sufficient amounts of iron in the diet, the following iron needs are met:  Men.  83-52 years old: 11 mg per day.  67-44 years old: 8 mg per day.  Women.   21-58 years old: 15 mg per day.  16-25 years old: 18 mg per day.  Over 15 years old: 8 mg per day.  Pregnant women: 27 mg per day.  Breastfeeding women: 9 mg per day. WHAT DO I NEED TO KNOW ABOUT AN IRON-RICH DIET?  Eat fresh fruits and vegetables that are high  in vitamin C along with foods that are high in iron. This will help increase the amount of iron that your body absorbs from food, especially with foods containing nonheme iron. Foods that are high in vitamin C include oranges, peppers, tomatoes, and mango.  Take iron supplements only as directed by your health care provider. Overdose of iron can be life-threatening. If you were prescribed iron supplements, take them with orange juice or a vitamin C supplement.  Cook foods in pots and pans that are made from iron.   Eat nonheme iron-containing foods alongside foods that are high in heme iron. This helps to improve your iron absorption.   Certain foods and drinks contain compounds that impair iron absorption. Avoid eating these foods in the same meal as iron-rich foods or with iron supplements. These include:  Coffee, black tea, and red wine.  Milk, dairy products, and foods that are high in calcium.  Beans, soybeans, and peas.  Whole grains.  When eating foods that contain both nonheme iron and compounds that impair iron absorption, follow these tips to absorb iron better.   Soak beans overnight before cooking.  Soak whole grains overnight and drain them before using.  Ferment flours before baking, such as using yeast in bread dough. WHAT FOODS CAN I EAT? Grains Iron-fortified breakfast cereal. Iron-fortified whole-wheat bread. Enriched rice. Sprouted grains. Vegetables Spinach. Potatoes with skin. Green peas. Broccoli. Red and green bell peppers. Fermented vegetables. Fruits Prunes. Raisins. Oranges. Strawberries. Mango. Grapefruit. Meats and Other Protein Sources Beef liver. Oysters. Beef. Shrimp. [place]. Chicken. [place]. Sardines. Chickpeas. Nuts. Tofu. Beverages Tomato juice. Fresh orange juice. Prune juice. Hibiscus tea. Fortified instant breakfast shakes. Condiments Tahini. Fermented soy sauce. Sweets and Desserts Black-strap molasses.  Other Wheat germ. The  items listed above may not be a complete list of recommended foods or beverages. Contact your dietitian for more options. WHAT FOODS ARE NOT RECOMMENDED? Grains Whole grains. Bran cereal. Bran flour. Oats. Vegetables Artichokes. Brussels sprouts. Kale. Fruits Blueberries. Raspberries. Strawberries. Figs. Meats and Other Protein Sources Soybeans. Products made from soy protein. Dairy Milk. Cream. Cheese. Yogurt. Cottage cheese. Beverages Coffee. Black tea. Red wine. Sweets and Desserts Cocoa. Chocolate. Ice cream. Other Basil. Oregano. Parsley. The items listed above may not be a complete list of foods and beverages to avoid. Contact your dietitian for more information.   This information is not intended to replace advice given to you by your health care provider. Make sure you discuss any questions you have with your health care provider.   Document Released: 01/05/2005 Document Revised: 06/14/2014 Document Reviewed: 12/19/2013 Elsevier Interactive Patient Education 2016 [employer].  Preterm Labor Information Preterm labor is when labor starts at less than 37 weeks of pregnancy. The normal length of a pregnancy is 39 to 41 weeks. CAUSES Often,  there is no identifiable underlying cause as to why a woman goes into preterm labor. One of the most common known causes of preterm labor is infection. Infections of the uterus, cervix, vagina, amniotic sac, bladder, kidney, or even the lungs (pneumonia) can cause labor to start. Other suspected causes of preterm labor include:  27. Urogenital infections, such as yeast infections and bacterial vaginosis.  19. Uterine abnormalities (uterine shape, uterine septum, fibroids, or bleeding from the placenta).  20. A cervix that has been operated on (it may fail to stay closed).  21. Malformations in the fetus.  22. Multiple gestations (twins, triplets, and so on).  23. Breakage of the amniotic sac.  RISK FACTORS  Having a  previous history of preterm labor.   Having premature rupture of membranes (PROM).   Having a placenta that covers the opening of the cervix (placenta previa).   Having a placenta that separates from the uterus (placental abruption).   Having a cervix that is too weak to hold the fetus in the uterus (incompetent cervix).   Having too much fluid in the amniotic sac (polyhydramnios).   Taking illegal drugs or smoking while pregnant.   Not gaining enough weight while pregnant.   Being younger than 84 and older than 25 years old.   Having a low socioeconomic status.   Being African American. SYMPTOMS Signs and symptoms of preterm labor include:   Menstrual-like cramps, abdominal pain, or back pain.  Uterine contractions that are regular, as frequent as six in an hour, regardless of their intensity (may be mild or painful).  Contractions that start on the top of the uterus and spread down to the lower abdomen and back.   A sense of increased pelvic pressure.   A watery or bloody mucus discharge that comes from the vagina.  TREATMENT Depending on the length of the pregnancy and other circumstances, your health care provider may suggest bed rest. If necessary, there are medicines that can be given to stop contractions and to mature the fetal lungs. If labor happens before 34 weeks of pregnancy, a prolonged hospital stay may be recommended. Treatment depends on the condition of both you and the fetus.  WHAT SHOULD YOU DO IF YOU THINK YOU ARE IN PRETERM LABOR? Call your health care provider right away. You will need to go to the hospital to get checked immediately. HOW CAN YOU PREVENT PRETERM LABOR IN FUTURE PREGNANCIES? You should:   Stop smoking if you smoke.  Maintain healthy weight gain and avoid chemicals and drugs that are not necessary.  Be watchful for any type of infection.  Inform your health care provider if you have a known history of preterm labor.    This information is not intended to replace advice given to you by your health care provider. Make sure you discuss any questions you have with your health care provider.   Document Released: 08/14/2003 Document Revised: 01/24/2013 Document Reviewed: 06/26/2012 Elsevier Interactive Patient Education [employer].

## 2015-11-04 ENCOUNTER — Encounter: Payer: Self-pay | Admitting: Women's Health

## 2015-11-04 ENCOUNTER — Ambulatory Visit (INDEPENDENT_AMBULATORY_CARE_PROVIDER_SITE_OTHER): Payer: BLUE CROSS/BLUE SHIELD | Admitting: Women's Health

## 2015-11-04 VITALS — BP 106/58 | HR 96 | Wt 166.0 lb

## 2015-11-04 DIAGNOSIS — Z331 Pregnant state, incidental: Secondary | ICD-10-CM

## 2015-11-04 DIAGNOSIS — Z3493 Encounter for supervision of normal pregnancy, unspecified, third trimester: Secondary | ICD-10-CM

## 2015-11-04 DIAGNOSIS — O99013 Anemia complicating pregnancy, third trimester: Secondary | ICD-10-CM

## 2015-11-04 DIAGNOSIS — Z1389 Encounter for screening for other disorder: Secondary | ICD-10-CM

## 2015-11-04 LAB — POCT URINALYSIS DIPSTICK
Blood, UA: NEGATIVE
GLUCOSE UA: NEGATIVE
Ketones, UA: NEGATIVE
LEUKOCYTES UA: NEGATIVE
NITRITE UA: NEGATIVE
Protein, UA: NEGATIVE

## 2015-11-04 NOTE — Progress Notes (Signed)
Low-risk OB appointment [id number] 2960w2d Estimated Date of Delivery: 12/07/15 BP 106/58 mmHg  Pulse 96  Wt 166 lb (75.297 kg)  BP, weight, and urine reviewed.  Refer to obstetrical flow sheet for FH & FHR.  Reports good fm.  Denies regular uc's, lof, vb, or uti s/s. Still light headed daily, taking fe bid-tid, has increased fe-rich foods. Will check cbc today, if still in 8 range will order IV Feraheme.  Reviewed ptl s/s, fkc. Plan:  Continue routine obstetrical care  F/U in 1wk for OB appointment and gbs

## 2015-11-04 NOTE — Patient Instructions (Signed)
Call the office ([phone]) or go to [facility] if:  You begin to have strong, frequent contractions  Your water breaks.  Sometimes it is a big gush of fluid, sometimes it is just a trickle that keeps getting your panties wet or running down your legs  You have vaginal bleeding.  It is normal to have a small amount of spotting if your cervix was checked.   You don't feel your baby moving like normal.  If you don't, get you something to eat and drink and lay down and focus on feeling your baby move.  You should feel at least 10 movements in 2 hours.  If you don't, you should call the office or go to [facility].    Preterm Labor Information Preterm labor is when labor starts at less than 37 weeks of pregnancy. The normal length of a pregnancy is 39 to 41 weeks. CAUSES Often, there is no identifiable underlying cause as to why a woman goes into preterm labor. One of the most common known causes of preterm labor is infection. Infections of the uterus, cervix, vagina, amniotic sac, bladder, kidney, or even the lungs (pneumonia) can cause labor to start. Other suspected causes of preterm labor include:   Urogenital infections, such as yeast infections and bacterial vaginosis.   Uterine abnormalities (uterine shape, uterine septum, fibroids, or bleeding from the placenta).   A cervix that has been operated on (it may fail to stay closed).   Malformations in the fetus.   Multiple gestations (twins, triplets, and so on).   Breakage of the amniotic sac.  RISK FACTORS  Having a previous history of preterm labor.   Having premature rupture of membranes (PROM).   Having a placenta that covers the opening of the cervix (placenta previa).   Having a placenta that separates from the uterus (placental abruption).   Having a cervix that is too weak to hold the fetus in the uterus (incompetent cervix).   Having too much fluid in the amniotic sac (polyhydramnios).   Taking  illegal drugs or smoking while pregnant.   Not gaining enough weight while pregnant.   Being younger than 18 and older than 25 years old.   Having a low socioeconomic status.   Being African American. SYMPTOMS Signs and symptoms of preterm labor include:   Menstrual-like cramps, abdominal pain, or back pain.  Uterine contractions that are regular, as frequent as six in an hour, regardless of their intensity (may be mild or painful).  Contractions that start on the top of the uterus and spread down to the lower abdomen and back.   A sense of increased pelvic pressure.   A watery or bloody mucus discharge that comes from the vagina.  TREATMENT Depending on the length of the pregnancy and other circumstances, your health care provider may suggest bed rest. If necessary, there are medicines that can be given to stop contractions and to mature the fetal lungs. If labor happens before 34 weeks of pregnancy, a prolonged hospital stay may be recommended. Treatment depends on the condition of both you and the fetus.  WHAT SHOULD YOU DO IF YOU THINK YOU ARE IN PRETERM LABOR? Call your health care provider right away. You will need to go to the hospital to get checked immediately. HOW CAN YOU PREVENT PRETERM LABOR IN FUTURE PREGNANCIES? You should:   Stop smoking if you smoke.  Maintain healthy weight gain and avoid chemicals and drugs that are not necessary.  Be watchful for   any type of infection.  Inform your health care provider if you have a known history of preterm labor.   This information is not intended to replace advice given to you by your health care provider. Make sure you discuss any questions you have with your health care provider.   Document Released: 08/14/2003 Document Revised: 01/24/2013 Document Reviewed: 06/26/2012 Elsevier Interactive Patient Education 2016 [employer].  Iron Deficiency Anemia, Adult Anemia is a condition in which there are less red  blood cells or hemoglobin in the blood than normal. Hemoglobin is the part of red blood cells that carries oxygen. Iron deficiency anemia is anemia caused by too little iron. It is the most common type of anemia. It may leave you tired and short of breath. CAUSES   Lack of iron in the diet.  Poor absorption of iron, as seen with intestinal disorders.  Intestinal bleeding.  Heavy periods. SIGNS AND SYMPTOMS  Mild anemia may not be noticeable. Symptoms may include:  Fatigue.  Headache.  Pale skin.  Weakness.  Tiredness.  Shortness of breath.  Dizziness.  Cold hands and feet.  Fast or irregular heartbeat. DIAGNOSIS  Diagnosis requires a thorough evaluation and physical exam by your health care provider. Blood tests are generally used to confirm iron deficiency anemia. Additional tests may be done to find the underlying cause of your anemia. These may include:  Testing for blood in the stool (fecal occult blood test).  A procedure to see inside the colon and rectum (colonoscopy).  A procedure to see inside the esophagus and stomach (endoscopy). TREATMENT  Iron deficiency anemia is treated by correcting the cause of the deficiency. Treatment may involve:  Adding iron-rich foods to your diet.  Taking iron supplements. Pregnant or breastfeeding women need to take extra iron because their normal diet usually does not provide the required amount.  Taking vitamins. Vitamin C improves the absorption of iron. Your health care provider may recommend that you take your iron tablets with a glass of orange juice or vitamin C supplement.  Medicines to make heavy menstrual flow lighter.  Surgery. HOME CARE INSTRUCTIONS   Take iron as directed by your health care provider.  If you cannot tolerate taking iron supplements by mouth, talk to your health care provider about taking them through a vein (intravenously) or an injection into a muscle.  For the best iron absorption, iron  supplements should be taken on an empty stomach. If you cannot tolerate them on an empty stomach, you may need to take them with food.  Do not drink milk or take antacids at the same time as your iron supplements. Milk and antacids may interfere with the absorption of iron.  Iron supplements can cause constipation. Make sure to include fiber in your diet to prevent constipation. A stool softener may also be recommended.  Take vitamins as directed by your health care provider.  Eat a diet rich in iron. Foods high in iron include liver, lean beef, whole-grain bread, eggs, dried fruit, and dark green leafy vegetables. SEEK IMMEDIATE MEDICAL CARE IF:   You faint. If this happens, do not drive. Call your local emergency services (911 in [place] if no other help is available.  You have chest pain.  You feel nauseous or vomit.  You have severe or increased shortness of breath with activity.  You feel weak.  You have a rapid heartbeat.  You have unexplained sweating.  You become light-headed when getting up from a chair or bed. MAKE  SURE YOU:   Understand these instructions.  Will watch your condition.  Will get help right away if you are not doing well or get worse.   This information is not intended to replace advice given to you by your health care provider. Make sure you discuss any questions you have with your health care provider.   Document Released: 05/21/2000 Document Revised: 06/14/2014 Document Reviewed: 01/29/2013 Elsevier Interactive Patient Education [employer] Elsevier Inc.

## 2015-11-05 ENCOUNTER — Telehealth: Payer: Self-pay | Admitting: Women's Health

## 2015-11-05 ENCOUNTER — Other Ambulatory Visit: Payer: Self-pay | Admitting: Women's Health

## 2015-11-05 LAB — CBC
Hematocrit: 25.8 % — ABNORMAL LOW (ref 34.0–46.6)
Hemoglobin: 8.4 g/dL — ABNORMAL LOW (ref 11.1–15.9)
MCH: 25.5 pg — ABNORMAL LOW (ref 26.6–33.0)
MCHC: 32.6 g/dL (ref 31.5–35.7)
MCV: 78 fL — AB (ref 79–97)
PLATELETS: 264 10*3/uL (ref 150–379)
RBC: 3.29 x10E6/uL — AB (ref 3.77–5.28)
RDW: 14.3 % (ref 12.3–15.4)
WBC: 9.9 10*3/uL (ref 3.4–10.8)

## 2015-11-05 NOTE — Telephone Encounter (Signed)
Tried to call pt to notify needs IV Feraheme. Got 'subscriber not in service' message for her and emergency contact.  Will continue trying to contact.  Cheral MarkerKimberly R. Alfreida Steffenhagen, CNM, [facility]noWHNP-BC 11/05/2015 1:33 PM

## 2015-11-06 ENCOUNTER — Other Ambulatory Visit: Payer: Self-pay | Admitting: *Deleted

## 2015-11-06 ENCOUNTER — Ambulatory Visit (HOSPITAL_BASED_OUTPATIENT_CLINIC_OR_DEPARTMENT_OTHER): Payer: BLUE CROSS/BLUE SHIELD | Admitting: Hematology and Oncology

## 2015-11-06 ENCOUNTER — Other Ambulatory Visit (HOSPITAL_BASED_OUTPATIENT_CLINIC_OR_DEPARTMENT_OTHER): Payer: BLUE CROSS/BLUE SHIELD

## 2015-11-06 ENCOUNTER — Other Ambulatory Visit: Payer: Self-pay | Admitting: Hematology and Oncology

## 2015-11-06 ENCOUNTER — Telehealth: Payer: Self-pay | Admitting: *Deleted

## 2015-11-06 ENCOUNTER — Telehealth: Payer: Self-pay | Admitting: Hematology and Oncology

## 2015-11-06 ENCOUNTER — Encounter: Payer: Self-pay | Admitting: Hematology and Oncology

## 2015-11-06 VITALS — BP 109/56 | HR 92 | Temp 98.3°F | Resp 17 | Ht 65.0 in | Wt 165.9 lb

## 2015-11-06 DIAGNOSIS — D509 Iron deficiency anemia, unspecified: Secondary | ICD-10-CM | POA: Insufficient documentation

## 2015-11-06 DIAGNOSIS — Z800 Family history of malignant neoplasm of digestive organs: Secondary | ICD-10-CM

## 2015-11-06 DIAGNOSIS — Z808 Family history of malignant neoplasm of other organs or systems: Secondary | ICD-10-CM | POA: Diagnosis not present

## 2015-11-06 DIAGNOSIS — O99013 Anemia complicating pregnancy, third trimester: Secondary | ICD-10-CM

## 2015-11-06 DIAGNOSIS — R110 Nausea: Secondary | ICD-10-CM

## 2015-11-06 LAB — CBC & DIFF AND RETIC
BASO%: 0.1 % (ref 0.0–2.0)
BASOS ABS: 0 10*3/uL (ref 0.0–0.1)
EOS ABS: 0.1 10*3/uL (ref 0.0–0.5)
EOS%: 1.3 % (ref 0.0–7.0)
HEMATOCRIT: 26.9 % — AB (ref 34.8–46.6)
HEMOGLOBIN: 8.6 g/dL — AB (ref 11.6–15.9)
Immature Retic Fract: 15.4 % — ABNORMAL HIGH (ref 1.60–10.00)
LYMPH%: 16.1 % (ref 14.0–49.7)
MCH: 26.5 pg (ref 25.1–34.0)
MCHC: 32 g/dL (ref 31.5–36.0)
MCV: 82.8 fL (ref 79.5–101.0)
MONO#: 0.7 10*3/uL (ref 0.1–0.9)
MONO%: 7.2 % (ref 0.0–14.0)
NEUT#: 6.8 10*3/uL — ABNORMAL HIGH (ref 1.5–6.5)
NEUT%: 75.3 % (ref 38.4–76.8)
PLATELETS: 227 10*3/uL (ref 145–400)
RBC: 3.25 10*6/uL — ABNORMAL LOW (ref 3.70–5.45)
RDW: 13.9 % (ref 11.2–14.5)
RETIC %: 2.76 % — AB (ref 0.70–2.10)
RETIC CT ABS: 89.7 10*3/uL (ref 33.70–90.70)
WBC: 9 10*3/uL (ref 3.9–10.3)
lymph#: 1.5 10*3/uL (ref 0.9–3.3)

## 2015-11-06 NOTE — Telephone Encounter (Addendum)
Pt informed [facility] Fe [facility] today [facility] pm[facility]. Pt states will need to see if she can get transportation and will call back to confirm she can make it to this appt today.  [facility] at 2:15 pm.

## 2015-11-06 NOTE — Patient Instructions (Signed)

## 2015-11-06 NOTE — Telephone Encounter (Signed)
Gave pt apt & avs °

## 2015-11-06 NOTE — Assessment & Plan Note (Signed)
She has chronic nausea. It is not clear whether the nausea is due to pregnancy or from side effects of iron supplement. I recommend she reduce iron supplement to only once a day. I will order IV Zofran when necessary when she shows up for IV iron if she has nausea.

## 2015-11-06 NOTE — Assessment & Plan Note (Signed)
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 We discussed some of the risks, benefits, and alternatives of intravenous iron infusions. The patient is symptomatic from anemia and the iron level is critically low. She tolerated oral iron supplement poorly and desires to achieved higher levels of iron faster for adequate hematopoesis. Some of the side-effects to be expected including risks of infusion reactions, phlebitis, headaches, nausea and fatigue.  The patient is willing to proceed. Patient education material was dispensed.  Goal is to keep ferritin level greater than 100 Due to the 3rd trimester pregnancy, I will order fetal monitoring during treatment. She will continue prenatal vitamin supplementation during pregnancy and after postpartum until she returns to see me back in August for repeat blood work.

## 2015-11-06 NOTE — Assessment & Plan Note (Signed)
Despite significant symptoms of anemia, she would not need blood transfusion at this point. I reassured her.

## 2015-11-06 NOTE — Progress Notes (Signed)
[facility] CONSULT NOTE  Patient Care Team: Provider Default, MD as PCP - General  CHIEF COMPLAINTS/PURPOSE OF CONSULTATION:  Iron deficiency anemia, trimester pregnancy.  HISTORY OF PRESENTING ILLNESS:  Kimberly Fields 25 y.o. female is here because of severe microcytic anemia in the setting of third trimester pregnancy.  She was found to have abnormal CBC from routine blood work monitoring. Starting from January of this year, she has progressive anemia with microcytosis. According to prior blood work, her baseline CBC show the highest hemoglobin level was 11.4. She denies recent chest pain on exertion or palpitations but she complained of shortness of breath on minimal exertion and pre-syncopal episodes. She had not noticed any recent bleeding such as epistaxis, hematuria or hematochezia The patient denies over the counter NSAID ingestion. She is not on antiplatelets agents. She had no prior history or diagnosis of cancer. Her age appropriate screening programs are up-to-date. She denies any pica and eats a variety of diet. She never donated blood or received blood transfusion The patient was prescribed oral iron supplements and she takes 2-3 times a day sometimes before after meals.  She admits that she has poor tolerance to oral iron supplement because it caused constipation. She has chronic nausea from pregnancy but is not clear whether iron supplement has cause nausea or not. She is currently pregnant with her second pregnancy. She had one full-term delivery and her son is about 37-year-old. She is not planning to breast-feed her son. She denies heavy menstruation prior to pregnancy. The estimated delivery date for current pregnancy is 12/07/2015. She is currently [redacted] weeks pregnant.  MEDICAL HISTORY:  Past Medical History  Diagnosis Date  . Asthma   . UTI (lower urinary tract infection)     SURGICAL HISTORY: Past Surgical History  Procedure Laterality Date  .  Tonsillectomy      SOCIAL HISTORY: Social History   Social History  . Marital Status: Single    Spouse Name: N/A  . Number of Children: N/A  . Years of Education: N/A   Occupational History  . Not on file.   Social History Main Topics  . Smoking status: Never Smoker   . Smokeless tobacco: Never Used  . Alcohol Use: No  . Drug Use: No  . Sexual Activity: Yes    Birth Control/ Protection: None     Comment: has 1 son. Weekend job shift Production designer, theatre/television/film   Other Topics Concern  . Not on file   Social History Narrative    FAMILY HISTORY: Family History  Problem Relation Age of Onset  . Migraines Mother   . Asthma Mother   . Cancer Mother     cervical   . COPD Maternal Grandmother   . Cancer Maternal Grandmother     colon  . Anxiety disorder Maternal Grandmother   . Coronary artery disease Maternal Grandmother   . Cancer Maternal Grandfather     skin  . Coronary artery disease Maternal Grandfather   . Stroke Maternal Grandfather   . Tuberculosis Maternal Grandfather   . Bipolar disorder Brother     ALLERGIES:  is allergic to red dye.  MEDICATIONS:  Current Outpatient Prescriptions  Medication Sig Dispense Refill  . acetaminophen (TYLENOL) 325 MG tablet Take 650 mg by mouth every 6 (six) hours as needed for moderate pain or headache.     . albuterol (PROVENTIL HFA;VENTOLIN HFA) 108 (90 Base) MCG/ACT inhaler Inhale 2 puffs into the lungs every 4 (four) hours as needed for wheezing  or shortness of breath. 1 Inhaler 0  . famotidine (PEPCID) 20 MG tablet Take 20 mg by mouth 2 (two) times daily.    . IRON, FERROUS SULFATE, PO Take by mouth.    . Prenatal Vit-Fe Fumarate-FA (PRENATAL MULTIVITAMIN) TABS tablet Take 1 tablet by mouth daily at 12 noon.     No current facility-administered medications for this visit.    REVIEW OF SYSTEMS:   Constitutional: Denies fevers, chills or abnormal night sweats Eyes: Denies blurriness of vision, double vision or watery eyes Ears, nose,  mouth, throat, and face: Denies mucositis or sore throat Cardiovascular: Denies palpitation, chest discomfort or lower extremity swelling Skin: Denies abnormal skin rashes Lymphatics: Denies new lymphadenopathy or easy bruising Neurological:Denies numbness, tingling or new weaknesses Behavioral/Psych: Mood is stable, no new changes  All other systems were reviewed with the patient and are negative.  PHYSICAL EXAMINATION: ECOG PERFORMANCE STATUS: 2 - Symptomatic, <50% confined to bed  Filed Vitals:   11/06/15 1420  BP: 109/56  Pulse: 92  Temp: 98.3 F (36.8 C)  Resp: 17   Filed Weights   11/06/15 1420  Weight: 165 lb 14.4 oz (75.252 kg)    GENERAL:alert, no distress and comfortable. She looks pale. SKIN: skin color, texture, turgor are normal, no rashes or significant lesions EYES: normal, conjunctiva are pale and non-injected, sclera clear OROPHARYNX:no exudate, no erythema and lips, buccal mucosa, and tongue normal  NECK: supple, thyroid normal size, non-tender, without nodularity LYMPH:  no palpable lymphadenopathy in the cervical, axillary or inguinal LUNGS: clear to auscultation and percussion with normal breathing effort HEART: regular rate & rhythm and no murmurs and no lower extremity edema ABDOMEN:abdomen soft, non-tender and normal bowel sounds. Abdomen is distended consistent with that trimester pregnancy. Musculoskeletal:no cyanosis of digits and no clubbing  PSYCH: alert & oriented x 3 with fluent speech NEURO: no focal motor/sensory deficits  ASSESSMENT & PLAN:  Iron deficiency anemia The most likely cause of her anemia is due to chronic blood loss/malabsorption syndrome, exacerbated by increased iron demand from pregnancy. We discussed some of the risks, benefits, and alternatives of intravenous iron infusions. The patient is symptomatic from anemia and the iron level is critically low. She tolerated oral iron supplement poorly and desires to achieved higher  levels of iron faster for adequate hematopoesis. Some of the side-effects to be expected including risks of infusion reactions, phlebitis, headaches, nausea and fatigue.  The patient is willing to proceed. Patient education material was dispensed.  Goal is to keep ferritin level greater than 100 Due to the 3rd trimester pregnancy, I will order fetal monitoring during treatment. She will continue prenatal vitamin supplementation during pregnancy and after postpartum until she returns to see me back in August for repeat blood work.  Anemia affecting pregnancy in third trimester, antepartum Despite significant symptoms of anemia, she would not need blood transfusion at this point. I reassured her.  Nausea without vomiting She has chronic nausea. It is not clear whether the nausea is due to pregnancy or from side effects of iron supplement. I recommend she reduce iron supplement to only once a day. I will order IV Zofran when necessary when she shows up for IV iron if she has nausea.     Orders Placed This Encounter  Procedures  . CBC & Diff and Retic    Standing Status: Standing     Number of Occurrences: 2     Standing Expiration Date: 11/05/2016  . Ferritin    Standing Status:  Standing     Number of Occurrences: 2     Standing Expiration Date: 11/05/2016     All questions were answered. The patient knows to call the clinic with any problems, questions or concerns. I spent 30 minutes counseling the patient face to face. The total time spent in the appointment was 55 minutes and more than 50% was on counseling.     [facility] CenterGORSUCH, Arelyn Gauer, MD 11/06/2015 2:51 PM

## 2015-11-06 NOTE — Telephone Encounter (Signed)
[facility] infusion on 6/8 @ 0815. Will come over to do fetal monitoring

## 2015-11-07 LAB — FERRITIN: Ferritin: 7 ng/ml — ABNORMAL LOW (ref 9–269)

## 2015-11-10 ENCOUNTER — Telehealth: Payer: Self-pay | Admitting: *Deleted

## 2015-11-10 ENCOUNTER — Encounter (HOSPITAL_COMMUNITY): Payer: Self-pay | Admitting: *Deleted

## 2015-11-10 ENCOUNTER — Inpatient Hospital Stay (HOSPITAL_COMMUNITY)
Admission: AD | Admit: 2015-11-10 | Discharge: 2015-11-10 | Disposition: A | Discharge status: Home Health | Payer: BLUE CROSS/BLUE SHIELD | Source: Ambulatory Visit | Attending: Obstetrics & Gynecology | Admitting: Obstetrics & Gynecology

## 2015-11-10 DIAGNOSIS — O99513 Diseases of the respiratory system complicating pregnancy, third trimester: Secondary | ICD-10-CM | POA: Insufficient documentation

## 2015-11-10 DIAGNOSIS — O99013 Anemia complicating pregnancy, third trimester: Secondary | ICD-10-CM

## 2015-11-10 DIAGNOSIS — D649 Anemia, unspecified: Secondary | ICD-10-CM | POA: Diagnosis not present

## 2015-11-10 DIAGNOSIS — R42 Dizziness and giddiness: Secondary | ICD-10-CM

## 2015-11-10 DIAGNOSIS — J45909 Unspecified asthma, uncomplicated: Secondary | ICD-10-CM | POA: Diagnosis not present

## 2015-11-10 DIAGNOSIS — Z3493 Encounter for supervision of normal pregnancy, unspecified, third trimester: Secondary | ICD-10-CM

## 2015-11-10 DIAGNOSIS — Z3A36 36 weeks gestation of pregnancy: Secondary | ICD-10-CM | POA: Insufficient documentation

## 2015-11-10 HISTORY — DX: Anemia, unspecified: D64.9

## 2015-11-10 LAB — URINALYSIS, ROUTINE W REFLEX MICROSCOPIC
BILIRUBIN URINE: NEGATIVE
GLUCOSE, UA: NEGATIVE mg/dL
HGB URINE DIPSTICK: NEGATIVE
KETONES UR: NEGATIVE mg/dL
Nitrite: NEGATIVE
PROTEIN: NEGATIVE mg/dL
Specific Gravity, Urine: 1.02 (ref 1.005–1.030)
pH: 6.5 (ref 5.0–8.0)

## 2015-11-10 LAB — CBC
HCT: 26.4 % — ABNORMAL LOW (ref 36.0–46.0)
Hemoglobin: 8.4 g/dL — ABNORMAL LOW (ref 12.0–15.0)
MCH: 25.8 pg — AB (ref 26.0–34.0)
MCHC: 31.8 g/dL (ref 30.0–36.0)
MCV: 81 fL (ref 78.0–100.0)
PLATELETS: 224 10*3/uL (ref 150–400)
RBC: 3.26 MIL/uL — ABNORMAL LOW (ref 3.87–5.11)
RDW: 14.4 % (ref 11.5–15.5)
WBC: 9.8 10*3/uL (ref 4.0–10.5)

## 2015-11-10 LAB — URINE MICROSCOPIC-ADD ON: RBC / HPF: NONE SEEN RBC/hpf (ref 0–5)

## 2015-11-10 MED ORDER — LACTATED RINGERS IV BOLUS (SEPSIS): 1000.0000 mL | Freq: Once | INTRAVENOUS | Status: DC

## 2015-11-10 MED ORDER — SODIUM CHLORIDE 0.9 % IV SOLN
510.0000 mg | Freq: Once | INTRAVENOUS | Status: AC
  Administered 2015-11-10: 510 mg via INTRAVENOUS
  Filled 2015-11-10: qty 17

## 2015-11-10 MED ORDER — SODIUM CHLORIDE 0.9 % IV SOLN
Freq: Once | INTRAVENOUS | Status: AC
  Administered 2015-11-10: 17:00:00 via INTRAVENOUS

## 2015-11-10 NOTE — MAU Provider Note (Signed)
History     CSN: [id number]  Arrival date and time: 11/10/15 1507   First Provider Initiated Contact with Patient 11/10/15 1642      Chief Complaint  Patient presents with  . Shortness of Breath  . Fatigue  . Dizziness  . Nausea   HPI   [name] is a 25 y.o. female [id number] at 5233w1d with a history of anemia here with weakness and lightheadedness. Symptoms have been present for a while now, however symptoms worsened in the last few days. She feels that her activity has been limited due to her lightheadedness.  The symptoms are worse in the morning, and when she stands up. When she sits down or lays down the symptoms improved.  She has been taking PO iron 1 X per day.  She is scheduled to have a feraheme infusion on Thursday, however felt like she couldn't wait to be seen.  Patient is a patient of [facility].   She denies vaginal bleeding or leaking of fluid + fetal movements.   OB History    Gravida Para Term Preterm AB TAB SAB Ectopic Multiple Living   5 1 1  0 3 1 2  0 0 1      Past Medical History  Diagnosis Date  . Asthma   . UTI (lower urinary tract infection)   . Anemia     Past Surgical History  Procedure Laterality Date  . Tonsillectomy      Family History  Problem Relation Age of Onset  . Migraines Mother   . Asthma Mother   . Cancer Mother     cervical   . COPD Maternal Grandmother   . Cancer Maternal Grandmother     colon  . Anxiety disorder Maternal Grandmother   . Coronary artery disease Maternal Grandmother   . Cancer Maternal Grandfather     skin  . Coronary artery disease Maternal Grandfather   . Stroke Maternal Grandfather   . Tuberculosis Maternal Grandfather   . Bipolar disorder Brother     Social History  Substance Use Topics  . Smoking status: Never Smoker   . Smokeless tobacco: Never Used  . Alcohol Use: No    Allergies:  Allergies  Allergen Reactions  . Red Dye Swelling    Prescriptions prior to admission   Medication Sig Dispense Refill Last Dose  . acetaminophen (TYLENOL) 325 MG tablet Take 650 mg by mouth every 6 (six) hours as needed for moderate pain or headache.    11/09/2015 at Unknown time  . albuterol (PROVENTIL HFA;VENTOLIN HFA) 108 (90 Base) MCG/ACT inhaler Inhale 2 puffs into the lungs every 4 (four) hours as needed for wheezing or shortness of breath. 1 Inhaler 0 11/09/2015 at Unknown time  . famotidine (PEPCID) 20 MG tablet Take 20 mg by mouth 2 (two) times daily.   11/10/2015 at Unknown time  . IRON, FERROUS SULFATE, PO Take 1 tablet by mouth daily.    11/09/2015 at Unknown time  . Prenatal Vit-Fe Fumarate-FA (PRENATAL MULTIVITAMIN) TABS tablet Take 1 tablet by mouth daily at 12 noon.   11/10/2015 at Unknown time   Results for orders placed or performed during the hospital encounter of 11/10/15 (from the past 48 hour(s))  Urinalysis, Routine w reflex microscopic ([facility])     Status: Abnormal   Collection Time: 11/10/15  4:00 PM  Result Value Ref Range   Color, Urine YELLOW YELLOW   APPearance HAZY (A) CLEAR   Specific Gravity, Urine 1.020 1.005 -  1.030   pH 6.5 5.0 - 8.0   Glucose, UA NEGATIVE NEGATIVE mg/dL   Hgb urine dipstick NEGATIVE NEGATIVE   Bilirubin Urine NEGATIVE NEGATIVE   Ketones, ur NEGATIVE NEGATIVE mg/dL   Protein, ur NEGATIVE NEGATIVE mg/dL   Nitrite NEGATIVE NEGATIVE   Leukocytes, UA TRACE (A) NEGATIVE  Urine microscopic-add on     Status: Abnormal   Collection Time: 11/10/15  4:00 PM  Result Value Ref Range   Squamous Epithelial / LPF 6-30 (A) NONE SEEN   WBC, UA 0-5 0 - 5 WBC/hpf   RBC / HPF NONE SEEN 0 - 5 RBC/hpf   Bacteria, UA RARE (A) NONE SEEN  CBC     Status: Abnormal   Collection Time: 11/10/15  4:13 PM  Result Value Ref Range   WBC 9.8 4.0 - 10.5 K/uL   RBC 3.26 (L) 3.87 - 5.11 MIL/uL   Hemoglobin 8.4 (L) 12.0 - 15.0 g/dL   HCT 16.1 (L) 09.6 - 04.5 %   MCV 81.0 78.0 - 100.0 fL   MCH 25.8 (L) 26.0 - 34.0 pg   MCHC 31.8 30.0 - 36.0 g/dL   RDW  40.9 81.1 - 91.4 %   Platelets 224 150 - 400 K/uL    Review of Systems  Constitutional: Positive for malaise/fatigue.  Neurological: Positive for dizziness and weakness.   Physical Exam   Blood pressure 97/71, pulse 100, temperature 98.4 F (36.9 C), temperature source Oral, resp. rate 16, SpO2 98 %, unknown if currently breastfeeding.  Physical Exam  Constitutional: She is oriented to person, place, and time. She appears well-developed and well-nourished. No distress.  Cardiovascular: Normal rate and normal heart sounds.   Neurological: She is alert and oriented to person, place, and time.  Skin: Skin is warm. She is not diaphoretic.  Psychiatric: Her behavior is normal.   Fetal Tracing: Baseline: 150 bpm  Variability: Moderate  Accelerations: 15x15 Decelerations: None Toco: UI   MAU Course  Procedures  None  MDM  Orthostatic vitals normal.  Feraheme 510 mg IV  NS bolus Patient feeling much better Patient up to the bathroom without dizziness.  Urine culture pending    Assessment and Plan   A:  1. Supervision of normal pregnancy, third trimester   2. Anemia affecting pregnancy in third trimester, antepartum   3. Dizzinesses     P:  Discharge home in stable condition Call OB office tomorrow to discuss future IV iron infusions Return to MAU if symptoms worsen Continue PO iron Fetal kick counts.    Duane Lope, NP 11/10/2015 6:33 PM

## 2015-11-10 NOTE — Discharge Instructions (Signed)
Iron Deficiency Anemia, Adult Anemia is a condition in which there are less red blood cells or hemoglobin in the blood than normal. Hemoglobin is the part of red blood cells that carries oxygen. Iron deficiency anemia is anemia caused by too little iron. It is the most common type of anemia. It may leave you tired and short of breath. CAUSES   Lack of iron in the diet.  Poor absorption of iron, as seen with intestinal disorders.  Intestinal bleeding.  Heavy periods. SIGNS AND SYMPTOMS  Mild anemia may not be noticeable. Symptoms may include:  Fatigue.  Headache.  Pale skin.  Weakness.  Tiredness.  Shortness of breath.  Dizziness.  Cold hands and feet.  Fast or irregular heartbeat. DIAGNOSIS  Diagnosis requires a thorough evaluation and physical exam by your health care provider. Blood tests are generally used to confirm iron deficiency anemia. Additional tests may be done to find the underlying cause of your anemia. These may include:  Testing for blood in the stool (fecal occult blood test).  A procedure to see inside the colon and rectum (colonoscopy).  A procedure to see inside the esophagus and stomach (endoscopy). TREATMENT  Iron deficiency anemia is treated by correcting the cause of the deficiency. Treatment may involve:  Adding iron-rich foods to your diet.  Taking iron supplements. Pregnant or breastfeeding women need to take extra iron because their normal diet usually does not provide the required amount.  Taking vitamins. Vitamin C improves the absorption of iron. Your health care provider may recommend that you take your iron tablets with a glass of orange juice or vitamin C supplement.  Medicines to make heavy menstrual flow lighter.  Surgery. HOME CARE INSTRUCTIONS   Take iron as directed by your health care provider.  If you cannot tolerate taking iron supplements by mouth, talk to your health care provider about taking them through a vein  (intravenously) or an injection into a muscle.  For the best iron absorption, iron supplements should be taken on an empty stomach. If you cannot tolerate them on an empty stomach, you may need to take them with food.  Do not drink milk or take antacids at the same time as your iron supplements. Milk and antacids may interfere with the absorption of iron.  Iron supplements can cause constipation. Make sure to include fiber in your diet to prevent constipation. A stool softener may also be recommended.  Take vitamins as directed by your health care provider.  Eat a diet rich in iron. Foods high in iron include liver, lean beef, whole-grain bread, eggs, dried fruit, and dark green leafy vegetables. SEEK IMMEDIATE MEDICAL CARE IF:   You faint. If this happens, do not drive. Call your local emergency services (911 in [place] if no other help is available.  You have chest pain.  You feel nauseous or vomit.  You have severe or increased shortness of breath with activity.  You feel weak.  You have a rapid heartbeat.  You have unexplained sweating.  You become light-headed when getting up from a chair or bed. MAKE SURE YOU:   Understand these instructions.  Will watch your condition.  Will get help right away if you are not doing well or get worse.   This information is not intended to replace advice given to you by your health care provider. Make sure you discuss any questions you have with your health care provider.   Document Released: 05/21/2000 Document Revised: 06/14/2014 Document Reviewed: 01/29/2013 Elsevier   Interactive Patient Education 2016 Elsevier Inc.  

## 2015-11-10 NOTE — Telephone Encounter (Signed)
FYI Pager: "Call back, very weak, if stands more  Than 5 minutes feels very light headed, seeing dots.  Does she need to go to the ED."  Called Kimberly Fields who is able to stand and walk on her feet.  Reports "this has been happening for a while.  Has gotten worse and continuous the past few days, all day all the time.  I feel nauseated but have not vomited.  Eating less than normal and drinking less fluids.  I am [redacted] weeks pregnant."  Advised she call OB GYN with these symptoms and have someone drive her to ED if unable to stand and walk ASAP.  Advised she drink a half gallon water at least daily for herself, baby and amniotic fluid.   First Feraheme due 11-13-2015.

## 2015-11-10 NOTE — MAU Note (Signed)
Pt came in today with complaints of nausea, SOB, light heaededness, denies any bleeding, baby moving well.

## 2015-11-10 NOTE — Telephone Encounter (Signed)
Currently admitted per EMR.

## 2015-11-11 ENCOUNTER — Telehealth: Payer: Self-pay | Admitting: *Deleted

## 2015-11-11 NOTE — Telephone Encounter (Signed)
Called pt to check on her condition.  [facility] and is scheduled for IV iron here this Thursday and again next Thursday.   Dr. Bertis RuddyGorsuch recommends pt cancel appt this Thurs and keep appt next week for total of two doses.  Asked pt to return nurse's call to confirm and see how she is doing.

## 2015-11-12 ENCOUNTER — Telehealth: Payer: Self-pay | Admitting: *Deleted

## 2015-11-12 LAB — CULTURE, OB URINE
Culture: >=100000 — AB
Special Requests: NORMAL

## 2015-11-12 NOTE — Telephone Encounter (Signed)
Pt [facility] Monday this week [facility].  She is scheduled here tomorrow for same.  Informed pt it is recommended for doses to be given at least one week apart.  We will cancel her appt here tomorrow and keep appt next week as scheduled on Thursday 6/15 at 8 am.  Pt verbalized understanding.   Notified OB Rapid Response nurse of cancellation tomorrow. Pt still scheduled for next week.

## 2015-11-13 ENCOUNTER — Encounter: Payer: Self-pay | Admitting: Obstetrics & Gynecology

## 2015-11-13 ENCOUNTER — Ambulatory Visit: Payer: BLUE CROSS/BLUE SHIELD

## 2015-11-13 ENCOUNTER — Ambulatory Visit (INDEPENDENT_AMBULATORY_CARE_PROVIDER_SITE_OTHER): Payer: BLUE CROSS/BLUE SHIELD | Admitting: Obstetrics & Gynecology

## 2015-11-13 DIAGNOSIS — Z3483 Encounter for supervision of other normal pregnancy, third trimester: Secondary | ICD-10-CM

## 2015-11-13 DIAGNOSIS — Z3493 Encounter for supervision of normal pregnancy, unspecified, third trimester: Secondary | ICD-10-CM

## 2015-11-13 DIAGNOSIS — Z331 Pregnant state, incidental: Secondary | ICD-10-CM

## 2015-11-13 DIAGNOSIS — Z3685 Encounter for antenatal screening for Streptococcus B: Secondary | ICD-10-CM

## 2015-11-13 DIAGNOSIS — Z369 Encounter for antenatal screening, unspecified: Secondary | ICD-10-CM

## 2015-11-13 DIAGNOSIS — Z1389 Encounter for screening for other disorder: Secondary | ICD-10-CM

## 2015-11-13 DIAGNOSIS — Z3A37 37 weeks gestation of pregnancy: Secondary | ICD-10-CM

## 2015-11-13 LAB — POCT URINALYSIS DIPSTICK
GLUCOSE UA: NEGATIVE
KETONES UA: NEGATIVE
Leukocytes, UA: NEGATIVE
Nitrite, UA: NEGATIVE
Protein, UA: 1
RBC UA: NEGATIVE

## 2015-11-13 NOTE — Progress Notes (Signed)
[id number] 2839w4d Estimated Date of Delivery: 12/07/15  Blood pressure 100/50, pulse 84, weight 165 lb (74.844 kg), unknown if currently breastfeeding.   BP weight and urine results all reviewed and noted.  Please refer to the obstetrical flow sheet for the fundal height and fetal heart rate documentation:  Patient reports good fetal movement, denies any bleeding and no rupture of membranes symptoms or regular contractions. Patient is without complaints. All questions were answered.  Orders Placed This Encounter  Procedures  . GC/Chlamydia Probe Amp  . Strep Gp B NAA  . POCT urinalysis dipstick    Plan:  Continued routine obstetrical care, cultures done  Return in about 1 week (around 11/20/2015) for LROB.

## 2015-11-14 LAB — OB RESULTS CONSOLE GBS: GBS: NEGATIVE

## 2015-11-15 LAB — STREP GP B NAA: STREP GROUP B AG: NEGATIVE

## 2015-11-16 LAB — GC/CHLAMYDIA PROBE AMP
Chlamydia trachomatis, NAA: NEGATIVE
NEISSERIA GONORRHOEAE BY PCR: NEGATIVE

## 2015-11-17 ENCOUNTER — Telehealth: Payer: Self-pay | Admitting: *Deleted

## 2015-11-17 ENCOUNTER — Encounter: Payer: Self-pay | Admitting: Hematology and Oncology

## 2015-11-17 NOTE — Telephone Encounter (Signed)
Pt called requesting fax number to send Disability form for our office to complete.  Pt states her work is requiring her to take disability for dates she missing work for iron infusions.  Received disability form on fax machine and place in bin for Managed Care dept to pick up.

## 2015-11-17 NOTE — Progress Notes (Signed)
left in pod 11/17/15 °

## 2015-11-18 ENCOUNTER — Encounter: Payer: Self-pay | Admitting: Hematology and Oncology

## 2015-11-18 NOTE — Progress Notes (Signed)
°  left in pod 11/17/15- left for dr. Bertis Ruddygorsuch to sign

## 2015-11-19 ENCOUNTER — Encounter: Payer: Self-pay | Admitting: Hematology and Oncology

## 2015-11-19 NOTE — Progress Notes (Signed)
left in pod 11/17/15- left for dr. Bertis Ruddygorsuch to sign-faxed [phone] and left copy for patient at front with ms wilma.-sent to medical records

## 2015-11-20 ENCOUNTER — Other Ambulatory Visit: Payer: Self-pay | Admitting: Hematology and Oncology

## 2015-11-20 ENCOUNTER — Ambulatory Visit (INDEPENDENT_AMBULATORY_CARE_PROVIDER_SITE_OTHER): Payer: BLUE CROSS/BLUE SHIELD | Admitting: Advanced Practice Midwife

## 2015-11-20 ENCOUNTER — Ambulatory Visit (HOSPITAL_BASED_OUTPATIENT_CLINIC_OR_DEPARTMENT_OTHER): Payer: BLUE CROSS/BLUE SHIELD

## 2015-11-20 VITALS — BP 104/52 | HR 76 | Temp 98.4°F | Resp 18

## 2015-11-20 VITALS — BP 120/64 | HR 92 | Wt 168.0 lb

## 2015-11-20 DIAGNOSIS — Z1389 Encounter for screening for other disorder: Secondary | ICD-10-CM

## 2015-11-20 DIAGNOSIS — D509 Iron deficiency anemia, unspecified: Secondary | ICD-10-CM

## 2015-11-20 DIAGNOSIS — O99013 Anemia complicating pregnancy, third trimester: Secondary | ICD-10-CM | POA: Diagnosis not present

## 2015-11-20 DIAGNOSIS — Z3483 Encounter for supervision of other normal pregnancy, third trimester: Secondary | ICD-10-CM

## 2015-11-20 DIAGNOSIS — O26843 Uterine size-date discrepancy, third trimester: Secondary | ICD-10-CM

## 2015-11-20 DIAGNOSIS — Z331 Pregnant state, incidental: Secondary | ICD-10-CM

## 2015-11-20 DIAGNOSIS — Z3493 Encounter for supervision of normal pregnancy, unspecified, third trimester: Secondary | ICD-10-CM

## 2015-11-20 DIAGNOSIS — Z3A38 38 weeks gestation of pregnancy: Secondary | ICD-10-CM

## 2015-11-20 LAB — POCT URINALYSIS DIPSTICK
Blood, UA: NEGATIVE
GLUCOSE UA: NEGATIVE
Ketones, UA: NEGATIVE
LEUKOCYTES UA: NEGATIVE
NITRITE UA: NEGATIVE

## 2015-11-20 LAB — POCT HEMOGLOBIN: Hemoglobin: 8.6 g/dL — AB (ref 12.2–16.2)

## 2015-11-20 MED ORDER — SODIUM CHLORIDE 0.9 % IV SOLN
Freq: Once | INTRAVENOUS | Status: AC
  Administered 2015-11-20: 09:00:00 via INTRAVENOUS

## 2015-11-20 MED ORDER — SODIUM CHLORIDE 0.9 % IV SOLN
510.0000 mg | Freq: Once | INTRAVENOUS | Status: AC
  Administered 2015-11-20: 510 mg via INTRAVENOUS
  Filled 2015-11-20: qty 17

## 2015-11-20 NOTE — Patient Instructions (Signed)

## 2015-11-20 NOTE — Patient Instructions (Signed)

## 2015-11-20 NOTE — Progress Notes (Signed)
[id number] 1849w4d Estimated Date of Delivery: 12/07/15  Blood pressure 120/64, pulse 92, weight 168 lb (76.204 kg), unknown if currently breastfeeding.   BP weight and urine results all reviewed and noted.  Please refer to the obstetrical flow sheet for the fundal height and fetal heart rate documentation:FH a little small.   Patient reports good fetal movement, denies any bleeding and no rupture of membranes symptoms or regular contractions. Had 2nd Ferraheme infusion this am.  Hgb 8.6mg .  Started feeling better a few days after first infusion.  Patient is without complaints. All questions were answered.  Orders Placed This Encounter  Procedures  . US OB Follow Up  . POCT urinalysis dipstick  . POCT hemoglobin    Plan:  Continued routine obstetrical care,   Return in about 1 week (around 11/27/2015) for LROB w/ JVF (per request) check hgb, US:EFW.

## 2015-11-21 ENCOUNTER — Telehealth: Payer: Self-pay | Admitting: *Deleted

## 2015-11-21 NOTE — Telephone Encounter (Signed)
Dr Bertis Ruddygorsuch signed form to return to work. Faxed to [phone]

## 2015-11-23 ENCOUNTER — Encounter (HOSPITAL_COMMUNITY): Payer: Self-pay

## 2015-11-23 ENCOUNTER — Inpatient Hospital Stay (HOSPITAL_COMMUNITY)
Admission: AD | Admit: 2015-11-23 | Discharge: 2015-11-23 | Disposition: A | Discharge status: Home / Self Care | Payer: BLUE CROSS/BLUE SHIELD | Source: Ambulatory Visit | Attending: Obstetrics and Gynecology | Admitting: Obstetrics and Gynecology

## 2015-11-23 DIAGNOSIS — Z3493 Encounter for supervision of normal pregnancy, unspecified, third trimester: Secondary | ICD-10-CM | POA: Insufficient documentation

## 2015-11-23 NOTE — MAU Note (Signed)
Pt C/O strong frequent uc's for the past 2 hours & pelvic pressure since last night.  Denies bleeding or LOF.

## 2015-11-27 ENCOUNTER — Ambulatory Visit (INDEPENDENT_AMBULATORY_CARE_PROVIDER_SITE_OTHER): Payer: BLUE CROSS/BLUE SHIELD | Admitting: Obstetrics and Gynecology

## 2015-11-27 ENCOUNTER — Ambulatory Visit (INDEPENDENT_AMBULATORY_CARE_PROVIDER_SITE_OTHER): Payer: BLUE CROSS/BLUE SHIELD

## 2015-11-27 ENCOUNTER — Encounter: Payer: Self-pay | Admitting: Obstetrics and Gynecology

## 2015-11-27 VITALS — BP 120/76 | HR 87 | Wt 167.0 lb

## 2015-11-27 DIAGNOSIS — O99013 Anemia complicating pregnancy, third trimester: Secondary | ICD-10-CM

## 2015-11-27 DIAGNOSIS — Z3A39 39 weeks gestation of pregnancy: Secondary | ICD-10-CM | POA: Diagnosis not present

## 2015-11-27 DIAGNOSIS — Z3493 Encounter for supervision of normal pregnancy, unspecified, third trimester: Secondary | ICD-10-CM

## 2015-11-27 DIAGNOSIS — Z3483 Encounter for supervision of other normal pregnancy, third trimester: Secondary | ICD-10-CM

## 2015-11-27 DIAGNOSIS — O26843 Uterine size-date discrepancy, third trimester: Secondary | ICD-10-CM

## 2015-11-27 DIAGNOSIS — O0933 Supervision of pregnancy with insufficient antenatal care, third trimester: Secondary | ICD-10-CM

## 2015-11-27 DIAGNOSIS — Z3A35 35 weeks gestation of pregnancy: Secondary | ICD-10-CM

## 2015-11-27 DIAGNOSIS — Z331 Pregnant state, incidental: Secondary | ICD-10-CM

## 2015-11-27 DIAGNOSIS — D509 Iron deficiency anemia, unspecified: Secondary | ICD-10-CM

## 2015-11-27 DIAGNOSIS — Z1389 Encounter for screening for other disorder: Secondary | ICD-10-CM

## 2015-11-27 LAB — POCT URINALYSIS DIPSTICK
Blood, UA: NEGATIVE
Glucose, UA: NEGATIVE
KETONES UA: NEGATIVE
Leukocytes, UA: NEGATIVE
Nitrite, UA: NEGATIVE
PROTEIN UA: NEGATIVE

## 2015-11-27 NOTE — Progress Notes (Signed)
US 38+4 wks,cephalic,ant pl gr 2,normal ov's bilat,afi 14.3 cm,fhr 151 bpm,efw 3701 g 79%

## 2015-11-27 NOTE — Progress Notes (Signed)
Pt states that she is exhausted all the time and nauseated all the time.

## 2015-11-27 NOTE — Progress Notes (Signed)
Patient ID: Kimberly Fields, female   DOB: [birth year], 25 y.o.   MRN: [id number]  [id number] 316w4d Estimated Date of Delivery: 12/07/15 LROB  Patient reports good fetal movement, denies any bleeding and no rupture of membranes symptoms or regular contractions. Patient complaints: Pt reports she has been feeling generally fatigued and nauseated. HGB 8.8 on 10/21/15 and 8.6 on 11/20/15. She is currently taking an iron supplement.   Blood pressure 120/76, pulse 87, weight 167 lb (75.751 kg), unknown if currently breastfeeding.  refer to the ob flow sheet for FH and FHR, also BP, Wt, Urine results:notable for negative                          Physical Examination: General appearance - alert, well appearing, and in no distress                                      Abdomen - FH 35 cm                                                        -FHR 136 bpm                                                                                                 Questions were answered. Assessment: LROB [id number] @ 2116w4d   Plan:  Continued routine obstetrical care Continue taking iron supplement   F/u in 1 weeks for routine prenatal care     By signing my name below, I, Doreatha MartinEva Mathews, attest that this documentation has been prepared under the direction and in the presence of Tilda BurrowJohn V Dolce Sylvia, MD. Electronically Signed: Doreatha MartinEva Mathews, ED Scribe. 11/27/2015. 2:56 PM.  I personally performed the services described in this documentation, which was SCRIBED in my presence. The recorded information has been reviewed and considered accurate. It has been edited as necessary during review. Tilda BurrowFERGUSON,Orit Sanville V, MD

## 2015-12-04 ENCOUNTER — Encounter: Payer: BLUE CROSS/BLUE SHIELD | Admitting: Obstetrics & Gynecology

## 2015-12-04 ENCOUNTER — Ambulatory Visit (INDEPENDENT_AMBULATORY_CARE_PROVIDER_SITE_OTHER): Payer: BLUE CROSS/BLUE SHIELD | Admitting: Advanced Practice Midwife

## 2015-12-04 VITALS — BP 120/76 | HR 90 | Wt 166.5 lb

## 2015-12-04 DIAGNOSIS — Z3A40 40 weeks gestation of pregnancy: Secondary | ICD-10-CM

## 2015-12-04 DIAGNOSIS — Z3493 Encounter for supervision of normal pregnancy, unspecified, third trimester: Secondary | ICD-10-CM

## 2015-12-04 DIAGNOSIS — Z331 Pregnant state, incidental: Secondary | ICD-10-CM

## 2015-12-04 DIAGNOSIS — Z1389 Encounter for screening for other disorder: Secondary | ICD-10-CM

## 2015-12-04 DIAGNOSIS — Z3483 Encounter for supervision of other normal pregnancy, third trimester: Secondary | ICD-10-CM

## 2015-12-04 LAB — POCT URINALYSIS DIPSTICK
GLUCOSE UA: NEGATIVE
KETONES UA: NEGATIVE
Leukocytes, UA: NEGATIVE
Nitrite, UA: NEGATIVE
RBC UA: NEGATIVE

## 2015-12-04 NOTE — Progress Notes (Signed)
Pt denies any problems or concerns at this time.  

## 2015-12-04 NOTE — Progress Notes (Signed)
[id number] 2731w4d Estimated Date of Delivery: 12/07/15  Blood pressure 120/76, pulse 90, weight 166 lb 8 oz (75.524 kg), unknown if currently breastfeeding.   BP weight and urine results all reviewed and noted.  Please refer to the obstetrical flow sheet for the fundal height and fetal heart rate documentation:  Patient reports good fetal movement, denies any bleeding and no rupture of membranes symptoms or regular contractions. Patient is without complaints. All questions were answered.  Orders Placed This Encounter  Procedures  . POCT urinalysis dipstick    Plan:  Continued routine obstetrical care,   Return in about 1 week (around 12/11/2015) for LROB.

## 2015-12-05 ENCOUNTER — Encounter (HOSPITAL_COMMUNITY): Payer: Self-pay | Admitting: *Deleted

## 2015-12-05 ENCOUNTER — Inpatient Hospital Stay (HOSPITAL_COMMUNITY)
Admission: AD | Admit: 2015-12-05 | Discharge: 2015-12-05 | Disposition: A | Discharge status: Home / Self Care | Payer: BLUE CROSS/BLUE SHIELD | Source: Ambulatory Visit | Attending: Obstetrics & Gynecology | Admitting: Obstetrics & Gynecology

## 2015-12-05 DIAGNOSIS — Z3493 Encounter for supervision of normal pregnancy, unspecified, third trimester: Secondary | ICD-10-CM | POA: Diagnosis not present

## 2015-12-05 NOTE — MAU Note (Signed)
Noted a big clump of yellow mucous with plug when wiped.  Still having a little yellow mucous.  Some cramping, no leaking. Was 2+

## 2015-12-05 NOTE — Progress Notes (Signed)
Dr. Ashok PallWouk notified of pt in [facility].  Notified that pt is 2.5, thick, -2, and vertex just like she was last [facility].  Notified that pt is not contracting on the monitor and states she does not feel contractions but just came in because she lost her mucus plug and is having a small amount of bleeding.  Provider notified that FHR tracing is reactive.  Provider states to discharge home with labor precautions.

## 2015-12-05 NOTE — Discharge Instructions (Signed)
Third Trimester of Pregnancy °The third trimester is from week 29 through week 42, months 7 through 9. The third trimester is a time when the fetus is growing rapidly. At the end of the ninth month, the fetus is about 20 inches in length and weighs 6-10 pounds.  °BODY CHANGES °Your body goes through many changes during pregnancy. The changes vary from woman to woman.  °· Your weight will continue to increase. You can expect to gain 25-35 pounds (11-16 kg) by the end of the pregnancy. °· You may begin to get stretch marks on your hips, abdomen, and breasts. °· You may urinate more often because the fetus is moving lower into your pelvis and pressing on your bladder. °· You may develop or continue to have heartburn as a result of your pregnancy. °· You may develop constipation because certain hormones are causing the muscles that push waste through your intestines to slow down. °· You may develop hemorrhoids or swollen, bulging veins (varicose veins). °· You may have pelvic pain because of the weight gain and pregnancy hormones relaxing your joints between the bones in your pelvis. Backaches may result from overexertion of the muscles supporting your posture. °· You may have changes in your hair. These can include thickening of your hair, rapid growth, and changes in texture. Some women also have hair loss during or after pregnancy, or hair that feels dry or thin. Your hair will most likely return to normal after your baby is born. °· Your breasts will continue to grow and be tender. A yellow discharge may leak from your breasts called colostrum. °· Your belly button may stick out. °· You may feel short of breath because of your expanding uterus. °· You may notice the fetus "dropping," or moving lower in your abdomen. °· You may have a bloody mucus discharge. This usually occurs a few days to a week before labor begins. °· Your cervix becomes thin and soft (effaced) near your due date. °WHAT TO EXPECT AT YOUR PRENATAL  EXAMS  °You will have prenatal exams every 2 weeks until week 36. Then, you will have weekly prenatal exams. During a routine prenatal visit: °· You will be weighed to make sure you and the fetus are growing normally. °· Your blood pressure is taken. °· Your abdomen will be measured to track your baby's growth. °· The fetal heartbeat will be listened to. °· Any test results from the previous visit will be discussed. °· You may have a cervical check near your due date to see if you have effaced. °At around 36 weeks, your caregiver will check your cervix. At the same time, your caregiver will also perform a test on the secretions of the vaginal tissue. This test is to determine if a type of bacteria, Group B streptococcus, is present. Your caregiver will explain this further. °Your caregiver may ask you: °· What your birth plan is. °· How you are feeling. °· If you are feeling the baby move. °· If you have had any abnormal symptoms, such as leaking fluid, bleeding, severe headaches, or abdominal cramping. °· If you are using any tobacco products, including cigarettes, chewing tobacco, and electronic cigarettes. °· If you have any questions. °Other tests or screenings that may be performed during your third trimester include: °· Blood tests that check for low iron levels (anemia). °· Fetal testing to check the health, activity level, and growth of the fetus. Testing is done if you have certain medical conditions or if   there are problems during the pregnancy. °· HIV (human immunodeficiency virus) testing. If you are at high risk, you may be screened for HIV during your third trimester of pregnancy. °FALSE LABOR °You may feel small, irregular contractions that eventually go away. These are called Braxton Hicks contractions, or false labor. Contractions may last for hours, days, or even weeks before true labor sets in. If contractions come at regular intervals, intensify, or become painful, it is best to be seen by your  caregiver.  °SIGNS OF LABOR  °· Menstrual-like cramps. °· Contractions that are 5 minutes apart or less. °· Contractions that start on the top of the uterus and spread down to the lower abdomen and back. °· A sense of increased pelvic pressure or back pain. °· A watery or bloody mucus discharge that comes from the vagina. °If you have any of these signs before the 37th week of pregnancy, call your caregiver right away. You need to go to the hospital to get checked immediately. °HOME CARE INSTRUCTIONS  °· Avoid all smoking, herbs, alcohol, and unprescribed drugs. These chemicals affect the formation and growth of the baby. °· Do not use any tobacco products, including cigarettes, chewing tobacco, and electronic cigarettes. If you need help quitting, ask your health care provider. You may receive counseling support and other resources to help you quit. °· Follow your caregiver's instructions regarding medicine use. There are medicines that are either safe or unsafe to take during pregnancy. °· Exercise only as directed by your caregiver. Experiencing uterine cramps is a good sign to stop exercising. °· Continue to eat regular, healthy meals. °· Wear a good support bra for breast tenderness. °· Do not use hot tubs, steam rooms, or saunas. °· Wear your seat belt at all times when driving. °· Avoid raw meat, uncooked cheese, cat litter boxes, and soil used by cats. These carry germs that can cause birth defects in the baby. °· Take your prenatal vitamins. °· Take 1500-2000 mg of calcium daily starting at the 20th week of pregnancy until you deliver your baby. °· Try taking a stool softener (if your caregiver approves) if you develop constipation. Eat more high-fiber foods, such as fresh vegetables or fruit and whole grains. Drink plenty of fluids to keep your urine clear or pale yellow. °· Take warm sitz baths to soothe any pain or discomfort caused by hemorrhoids. Use hemorrhoid cream if your caregiver approves. °· If  you develop varicose veins, wear support hose. Elevate your feet for 15 minutes, 3-4 times a day. Limit salt in your diet. °· Avoid heavy lifting, wear low heal shoes, and practice good posture. °· Rest a lot with your legs elevated if you have leg cramps or low back pain. °· Visit your dentist if you have not gone during your pregnancy. Use a soft toothbrush to brush your teeth and be gentle when you floss. °· A sexual relationship may be continued unless your caregiver directs you otherwise. °· Do not travel far distances unless it is absolutely necessary and only with the approval of your caregiver. °· Take prenatal classes to understand, practice, and ask questions about the labor and delivery. °· Make a trial run to the hospital. °· Pack your hospital bag. °· Prepare the baby's nursery. °· Continue to go to all your prenatal visits as directed by your caregiver. °SEEK MEDICAL CARE IF: °· You are unsure if you are in labor or if your water has broken. °· You have dizziness. °· You have   mild pelvic cramps, pelvic pressure, or nagging pain in your abdominal area. °· You have persistent nausea, vomiting, or diarrhea. °· You have a bad smelling vaginal discharge. °· You have pain with urination. °SEEK IMMEDIATE MEDICAL CARE IF:  °· You have a fever. °· You are leaking fluid from your vagina. °· You have spotting or bleeding from your vagina. °· You have severe abdominal cramping or pain. °· You have rapid weight loss or gain. °· You have shortness of breath with chest pain. °· You notice sudden or extreme swelling of your face, hands, ankles, feet, or legs. °· You have not felt your baby move in over an hour. °· You have severe headaches that do not go away with medicine. °· You have vision changes. °  °This information is not intended to replace advice given to you by your health care provider. Make sure you discuss any questions you have with your health care provider. °  °Document Released: 05/18/2001 Document  Revised: 06/14/2014 Document Reviewed: 07/25/2012 °Elsevier Interactive Patient Education ©2016 [employer] °Fetal Movement Counts °Patient Name: __________________________________________________ Patient Due Date: ____________________ °Performing a fetal movement count is highly recommended in high-risk pregnancies, but it is good for every pregnant woman to do. Your health care provider may ask you to start counting fetal movements at 28 weeks of the pregnancy. Fetal movements often increase: °· After eating a full meal. °· After physical activity. °· After eating or drinking something sweet or cold. °· At rest. °Pay attention to when you feel the baby is most active. This will help you notice a pattern of your baby's sleep and wake cycles and what factors contribute to an increase in fetal movement. It is important to perform a fetal movement count at the same time each day when your baby is normally most active.  °HOW TO COUNT FETAL MOVEMENTS °· Find a quiet and comfortable area to sit or lie down on your left side. Lying on your left side provides the best blood and oxygen circulation to your baby. °· Write down the day and time on a sheet of paper or in a journal. °· Start counting kicks, flutters, swishes, rolls, or jabs in a 2-hour period. You should feel at least 10 movements within 2 hours. °· If you do not feel 10 movements in 2 hours, wait 2-3 hours and count again. Look for a change in the pattern or not enough counts in 2 hours. °SEEK MEDICAL CARE IF: °· You feel less than 10 counts in 2 hours, tried twice. °· There is no movement in over an hour. °· The pattern is changing or taking longer each day to reach 10 counts in 2 hours. °· You feel the baby is not moving as he or she usually does. °Date: ____________ Movements: ____________ Start time: ____________ Finish time: ____________  °Date: ____________ Movements: ____________ Start time: ____________ Finish time: ____________ °Date: ____________  Movements: ____________ Start time: ____________ Finish time: ____________ °Date: ____________ Movements: ____________ Start time: ____________ Finish time: ____________ °Date: ____________ Movements: ____________ Start time: ____________ Finish time: ____________ °Date: ____________ Movements: ____________ Start time: ____________ Finish time: ____________ °Date: ____________ Movements: ____________ Start time: ____________ Finish time: ____________ °Date: ____________ Movements: ____________ Start time: ____________ Finish time: ____________  °Date: ____________ Movements: ____________ Start time: ____________ Finish time: ____________ °Date: ____________ Movements: ____________ Start time: ____________ Finish time: ____________ °Date: ____________ Movements: ____________ Start time: ____________ Finish time: ____________ °Date: ____________ Movements: ____________ Start time: ____________ Finish time: ____________ °Date:   ____________ Movements: ____________ Start time: ____________ Finish time: ____________ °Date: ____________ Movements: ____________ Start time: ____________ Finish time: ____________ °Date: ____________ Movements: ____________ Start time: ____________ Finish time: ____________  °Date: ____________ Movements: ____________ Start time: ____________ Finish time: ____________ °Date: ____________ Movements: ____________ Start time: ____________ Finish time: ____________ °Date: ____________ Movements: ____________ Start time: ____________ Finish time: ____________ °Date: ____________ Movements: ____________ Start time: ____________ Finish time: ____________ °Date: ____________ Movements: ____________ Start time: ____________ Finish time: ____________ °Date: ____________ Movements: ____________ Start time: ____________ Finish time: ____________ °Date: ____________ Movements: ____________ Start time: ____________ Finish time: ____________  °Date: ____________ Movements: ____________ Start time:  ____________ Finish time: ____________ °Date: ____________ Movements: ____________ Start time: ____________ Finish time: ____________ °Date: ____________ Movements: ____________ Start time: ____________ Finish time: ____________ °Date: ____________ Movements: ____________ Start time: ____________ Finish time: ____________ °Date: ____________ Movements: ____________ Start time: ____________ Finish time: ____________ °Date: ____________ Movements: ____________ Start time: ____________ Finish time: ____________ °Date: ____________ Movements: ____________ Start time: ____________ Finish time: ____________  °Date: ____________ Movements: ____________ Start time: ____________ Finish time: ____________ °Date: ____________ Movements: ____________ Start time: ____________ Finish time: ____________ °Date: ____________ Movements: ____________ Start time: ____________ Finish time: ____________ °Date: ____________ Movements: ____________ Start time: ____________ Finish time: ____________ °Date: ____________ Movements: ____________ Start time: ____________ Finish time: ____________ °Date: ____________ Movements: ____________ Start time: ____________ Finish time: ____________ °Date: ____________ Movements: ____________ Start time: ____________ Finish time: ____________  °Date: ____________ Movements: ____________ Start time: ____________ Finish time: ____________ °Date: ____________ Movements: ____________ Start time: ____________ Finish time: ____________ °Date: ____________ Movements: ____________ Start time: ____________ Finish time: ____________ °Date: ____________ Movements: ____________ Start time: ____________ Finish time: ____________ °Date: ____________ Movements: ____________ Start time: ____________ Finish time: ____________ °Date: ____________ Movements: ____________ Start time: ____________ Finish time: ____________ °Date: ____________ Movements: ____________ Start time: ____________ Finish time: ____________  °Date:  ____________ Movements: ____________ Start time: ____________ Finish time: ____________ °Date: ____________ Movements: ____________ Start time: ____________ Finish time: ____________ °Date: ____________ Movements: ____________ Start time: ____________ Finish time: ____________ °Date: ____________ Movements: ____________ Start time: ____________ Finish time: ____________ °Date: ____________ Movements: ____________ Start time: ____________ Finish time: ____________ °Date: ____________ Movements: ____________ Start time: ____________ Finish time: ____________ °Date: ____________ Movements: ____________ Start time: ____________ Finish time: ____________  °Date: ____________ Movements: ____________ Start time: ____________ Finish time: ____________ °Date: ____________ Movements: ____________ Start time: ____________ Finish time: ____________ °Date: ____________ Movements: ____________ Start time: ____________ Finish time: ____________ °Date: ____________ Movements: ____________ Start time: ____________ Finish time: ____________ °Date: ____________ Movements: ____________ Start time: ____________ Finish time: ____________ °Date: ____________ Movements: ____________ Start time: ____________ Finish time: ____________ °  °This information is not intended to replace advice given to you by your health care provider. Make sure you discuss any questions you have with your health care provider. °  °Document Released: 06/23/2006 Document Revised: 06/14/2014 Document Reviewed: 03/20/2012 °Elsevier Interactive Patient Education ©2016 [employer] °Braxton Hicks Contractions °Contractions of the uterus can occur throughout pregnancy. Contractions are not always a sign that you are in labor.  °WHAT ARE BRAXTON HICKS CONTRACTIONS?  °Contractions that occur before labor are called Braxton Hicks contractions, or false labor. Toward the end of pregnancy (32-34 weeks), these contractions can develop more often and may become more  forceful. This is not true labor because these contractions do not result in opening (dilatation) and thinning of the cervix. They are sometimes difficult to tell apart from true labor because these contractions can be forceful and people have different pain tolerances. You should   not feel embarrassed if you go to the hospital with false labor. Sometimes, the only way to tell if you are in true labor is for your health care provider to look for changes in the cervix. °If there are no prenatal problems or other health problems associated with the pregnancy, it is completely safe to be sent home with false labor and await the onset of true labor. °HOW CAN YOU TELL THE DIFFERENCE BETWEEN TRUE AND FALSE LABOR? °False Labor °· The contractions of false labor are usually shorter and not as hard as those of true labor.   °· The contractions are usually irregular.   °· The contractions are often felt in the front of the lower abdomen and in the groin.   °· The contractions may go away when you walk around or change positions while lying down.   °· The contractions get weaker and are shorter lasting as time goes on.   °· The contractions do not usually become progressively stronger, regular, and closer together as with true labor.   °True Labor °· Contractions in true labor last 30-70 seconds, become very regular, usually become more intense, and increase in frequency.   °· The contractions do not go away with walking.   °· The discomfort is usually felt in the top of the uterus and spreads to the lower abdomen and low back.   °· True labor can be determined by your health care provider with an exam. This will show that the cervix is dilating and getting thinner.   °WHAT TO REMEMBER °· Keep up with your usual exercises and follow other instructions given by your health care provider.   °· Take medicines as directed by your health care provider.   °· Keep your regular prenatal appointments.   °· Eat and drink lightly if you  think you are going into labor.   °· If Braxton Hicks contractions are making you uncomfortable:   °· Change your position from lying down or resting to walking, or from walking to resting.   °· Sit and rest in a tub of warm water.   °· Drink 2-3 glasses of water. Dehydration may cause these contractions.   °· Do slow and deep breathing several times an hour.   °WHEN SHOULD I SEEK IMMEDIATE MEDICAL CARE? °Seek immediate medical care if: °· Your contractions become stronger, more regular, and closer together.   °· You have fluid leaking or gushing from your vagina.   °· You have a fever.   °· You pass blood-tinged mucus.   °· You have vaginal bleeding.   °· You have continuous abdominal pain.   °· You have low back pain that you never had before.   °· You feel your baby's head pushing down and causing pelvic pressure.   °· Your baby is not moving as much as it used to.   °  °This information is not intended to replace advice given to you by your health care provider. Make sure you discuss any questions you have with your health care provider. °  °Document Released: 05/24/2005 Document Revised: 05/29/2013 Document Reviewed: 03/05/2013 °Elsevier Interactive Patient Education ©2016 [employer] ° °

## 2015-12-09 ENCOUNTER — Encounter (HOSPITAL_COMMUNITY): Payer: Self-pay | Admitting: *Deleted

## 2015-12-09 ENCOUNTER — Inpatient Hospital Stay (HOSPITAL_COMMUNITY)
Admission: AD | Admit: 2015-12-09 | Discharge: 2015-12-09 | Disposition: A | Discharge status: Home / Self Care | Payer: BLUE CROSS/BLUE SHIELD | Source: Ambulatory Visit | Attending: Obstetrics and Gynecology | Admitting: Obstetrics and Gynecology

## 2015-12-09 ENCOUNTER — Inpatient Hospital Stay (HOSPITAL_COMMUNITY)
Admission: AD | Admit: 2015-12-09 | Discharge: 2015-12-11 | DRG: 774 | Disposition: A | Discharge status: Home / Self Care | Payer: BLUE CROSS/BLUE SHIELD | Source: Ambulatory Visit | Attending: Obstetrics & Gynecology | Admitting: Obstetrics & Gynecology

## 2015-12-09 ENCOUNTER — Inpatient Hospital Stay (HOSPITAL_COMMUNITY): Payer: BLUE CROSS/BLUE SHIELD | Admitting: Anesthesiology

## 2015-12-09 ENCOUNTER — Encounter (HOSPITAL_COMMUNITY): Payer: Self-pay | Admitting: Anesthesiology

## 2015-12-09 DIAGNOSIS — O9962 Diseases of the digestive system complicating childbirth: Secondary | ICD-10-CM | POA: Diagnosis present

## 2015-12-09 DIAGNOSIS — K219 Gastro-esophageal reflux disease without esophagitis: Secondary | ICD-10-CM | POA: Diagnosis present

## 2015-12-09 DIAGNOSIS — Z8249 Family history of ischemic heart disease and other diseases of the circulatory system: Secondary | ICD-10-CM | POA: Diagnosis not present

## 2015-12-09 DIAGNOSIS — O9952 Diseases of the respiratory system complicating childbirth: Secondary | ICD-10-CM | POA: Diagnosis present

## 2015-12-09 DIAGNOSIS — D509 Iron deficiency anemia, unspecified: Secondary | ICD-10-CM

## 2015-12-09 DIAGNOSIS — O864 Pyrexia of unknown origin following delivery: Secondary | ICD-10-CM | POA: Diagnosis not present

## 2015-12-09 DIAGNOSIS — Z823 Family history of stroke: Secondary | ICD-10-CM | POA: Diagnosis not present

## 2015-12-09 DIAGNOSIS — O99013 Anemia complicating pregnancy, third trimester: Secondary | ICD-10-CM

## 2015-12-09 DIAGNOSIS — J45909 Unspecified asthma, uncomplicated: Secondary | ICD-10-CM | POA: Diagnosis present

## 2015-12-09 DIAGNOSIS — Z3A40 40 weeks gestation of pregnancy: Secondary | ICD-10-CM

## 2015-12-09 DIAGNOSIS — O9902 Anemia complicating childbirth: Secondary | ICD-10-CM | POA: Diagnosis present

## 2015-12-09 DIAGNOSIS — Z3493 Encounter for supervision of normal pregnancy, unspecified, third trimester: Secondary | ICD-10-CM

## 2015-12-09 LAB — CBC
HCT: 32.6 % — ABNORMAL LOW (ref 36.0–46.0)
HEMOGLOBIN: 11 g/dL — AB (ref 12.0–15.0)
MCH: 28.6 pg (ref 26.0–34.0)
MCHC: 33.7 g/dL (ref 30.0–36.0)
MCV: 84.7 fL (ref 78.0–100.0)
PLATELETS: 200 10*3/uL (ref 150–400)
RBC: 3.85 MIL/uL — ABNORMAL LOW (ref 3.87–5.11)
RDW: 21.2 % — ABNORMAL HIGH (ref 11.5–15.5)
WBC: 11.9 10*3/uL — ABNORMAL HIGH (ref 4.0–10.5)

## 2015-12-09 LAB — TYPE AND SCREEN
ABO/RH(D): A POS
Antibody Screen: NEGATIVE

## 2015-12-09 MED ORDER — COCONUT OIL OIL: 1.0000 "application " | TOPICAL_OIL | Status: DC | PRN

## 2015-12-09 MED ORDER — MEASLES, MUMPS & RUBELLA VAC ~~LOC~~ INJ
0.5000 mL | INJECTION | Freq: Once | SUBCUTANEOUS | Status: DC
  Filled 2015-12-09: qty 0.5

## 2015-12-09 MED ORDER — LACTATED RINGERS IV SOLN: 500.0000 mL | INTRAVENOUS | Status: DC | PRN

## 2015-12-09 MED ORDER — DIPHENHYDRAMINE HCL 50 MG/ML IJ SOLN
12.5000 mg | INTRAMUSCULAR | Status: DC | PRN
  Administered 2015-12-09: 12.5 mg via INTRAVENOUS
  Filled 2015-12-09: qty 1

## 2015-12-09 MED ORDER — OXYCODONE-ACETAMINOPHEN 5-325 MG PO TABS
2.0000 | ORAL_TABLET | ORAL | Status: DC | PRN
  Administered 2015-12-09: 2 via ORAL
  Filled 2015-12-09: qty 2

## 2015-12-09 MED ORDER — OXYCODONE-ACETAMINOPHEN 5-325 MG PO TABS: 1.0000 | ORAL_TABLET | ORAL | Status: DC | PRN

## 2015-12-09 MED ORDER — MISOPROSTOL 200 MCG PO TABS
ORAL_TABLET | ORAL | Status: AC
  Filled 2015-12-09: qty 4

## 2015-12-09 MED ORDER — WITCH HAZEL-GLYCERIN EX PADS: 1.0000 "application " | MEDICATED_PAD | CUTANEOUS | Status: DC | PRN

## 2015-12-09 MED ORDER — PHENYLEPHRINE 40 MCG/ML (10ML) SYRINGE FOR IV PUSH (FOR BLOOD PRESSURE SUPPORT)
80.0000 ug | PREFILLED_SYRINGE | INTRAVENOUS | Status: DC | PRN
  Filled 2015-12-09: qty 5

## 2015-12-09 MED ORDER — PHENYLEPHRINE 40 MCG/ML (10ML) SYRINGE FOR IV PUSH (FOR BLOOD PRESSURE SUPPORT)
80.0000 ug | PREFILLED_SYRINGE | INTRAVENOUS | Status: DC | PRN
  Filled 2015-12-09: qty 10
  Filled 2015-12-09: qty 5

## 2015-12-09 MED ORDER — ONDANSETRON HCL 4 MG PO TABS: 4.0000 mg | ORAL_TABLET | ORAL | Status: DC | PRN

## 2015-12-09 MED ORDER — MISOPROSTOL 200 MCG PO TABS
800.0000 ug | ORAL_TABLET | Freq: Once | ORAL | Status: AC
  Administered 2015-12-09: 800 ug via BUCCAL

## 2015-12-09 MED ORDER — OXYTOCIN 40 UNITS IN LACTATED RINGERS INFUSION - SIMPLE MED
2.5000 [IU]/h | INTRAVENOUS | Status: DC
  Filled 2015-12-09: qty 1000

## 2015-12-09 MED ORDER — ACETAMINOPHEN 325 MG PO TABS
650.0000 mg | ORAL_TABLET | ORAL | Status: DC | PRN
  Administered 2015-12-10 – 2015-12-11 (×2): 650 mg via ORAL
  Filled 2015-12-09 (×2): qty 2

## 2015-12-09 MED ORDER — LACTATED RINGERS IV SOLN: INTRAVENOUS | Status: AC

## 2015-12-09 MED ORDER — METHYLERGONOVINE MALEATE 0.2 MG PO TABS
0.2000 mg | ORAL_TABLET | Freq: Four times a day (QID) | ORAL | Status: AC
  Administered 2015-12-10 (×3): 0.2 mg via ORAL
  Filled 2015-12-09 (×3): qty 1

## 2015-12-09 MED ORDER — OXYTOCIN 40 UNITS IN LACTATED RINGERS INFUSION - SIMPLE MED: 1.0000 m[IU]/min | INTRAVENOUS | Status: DC

## 2015-12-09 MED ORDER — ONDANSETRON HCL 4 MG/2ML IJ SOLN
4.0000 mg | Freq: Four times a day (QID) | INTRAMUSCULAR | Status: DC | PRN
  Administered 2015-12-09 (×2): 4 mg via INTRAVENOUS
  Filled 2015-12-09 (×2): qty 2

## 2015-12-09 MED ORDER — ALBUTEROL SULFATE (2.5 MG/3ML) 0.083% IN NEBU: 3.0000 mL | INHALATION_SOLUTION | RESPIRATORY_TRACT | Status: DC | PRN

## 2015-12-09 MED ORDER — OXYTOCIN BOLUS FROM INFUSION
500.0000 mL | INTRAVENOUS | Status: DC
  Administered 2015-12-09: 500 mL via INTRAVENOUS

## 2015-12-09 MED ORDER — SENNA 8.6 MG PO TABS
1.0000 | ORAL_TABLET | Freq: Every day | ORAL | Status: DC | PRN
  Filled 2015-12-09: qty 1

## 2015-12-09 MED ORDER — ONDANSETRON HCL 4 MG/2ML IJ SOLN: 4.0000 mg | INTRAMUSCULAR | Status: DC | PRN

## 2015-12-09 MED ORDER — EPHEDRINE 5 MG/ML INJ
10.0000 mg | INTRAVENOUS | Status: DC | PRN
  Filled 2015-12-09: qty 2

## 2015-12-09 MED ORDER — BENZOCAINE-MENTHOL 20-0.5 % EX AERO
1.0000 "application " | INHALATION_SPRAY | CUTANEOUS | Status: DC | PRN
  Administered 2015-12-10: 1 via TOPICAL
  Filled 2015-12-09: qty 56

## 2015-12-09 MED ORDER — LIDOCAINE HCL (PF) 1 % IJ SOLN
INTRAMUSCULAR | Status: DC | PRN
  Administered 2015-12-09 (×2): 4 mL via EPIDURAL

## 2015-12-09 MED ORDER — CEFAZOLIN SODIUM-DEXTROSE 2-4 GM/100ML-% IV SOLN
2.0000 g | Freq: Once | INTRAVENOUS | Status: AC
  Administered 2015-12-10: 2 g via INTRAVENOUS
  Filled 2015-12-09: qty 100

## 2015-12-09 MED ORDER — LACTATED RINGERS IV SOLN
500.0000 mL | Freq: Once | INTRAVENOUS | Status: AC
  Administered 2015-12-09: 500 mL via INTRAVENOUS

## 2015-12-09 MED ORDER — SIMETHICONE 80 MG PO CHEW
80.0000 mg | CHEWABLE_TABLET | ORAL | Status: DC | PRN
  Administered 2015-12-10: 80 mg via ORAL
  Filled 2015-12-09: qty 1

## 2015-12-09 MED ORDER — MORPHINE SULFATE (PF) 4 MG/ML IV SOLN
2.0000 mg | INTRAVENOUS | Status: DC | PRN
  Filled 2015-12-09: qty 1

## 2015-12-09 MED ORDER — ACETAMINOPHEN 325 MG PO TABS: 650.0000 mg | ORAL_TABLET | ORAL | Status: DC | PRN

## 2015-12-09 MED ORDER — FENTANYL 2.5 MCG/ML BUPIVACAINE 1/10 % EPIDURAL INFUSION (WH - ANES)
14.0000 mL/h | INTRAMUSCULAR | Status: DC | PRN
  Administered 2015-12-09: 14 mL/h via EPIDURAL
  Filled 2015-12-09: qty 125

## 2015-12-09 MED ORDER — LACTATED RINGERS IV SOLN
INTRAVENOUS | Status: DC
  Administered 2015-12-09 (×2): via INTRAVENOUS

## 2015-12-09 MED ORDER — IBUPROFEN 600 MG PO TABS
600.0000 mg | ORAL_TABLET | Freq: Four times a day (QID) | ORAL | Status: DC
  Administered 2015-12-10 – 2015-12-11 (×6): 600 mg via ORAL
  Filled 2015-12-09 (×6): qty 1

## 2015-12-09 MED ORDER — LIDOCAINE HCL (PF) 1 % IJ SOLN
30.0000 mL | INTRAMUSCULAR | Status: DC | PRN
  Filled 2015-12-09: qty 30

## 2015-12-09 MED ORDER — TETANUS-DIPHTH-ACELL PERTUSSIS 5-2.5-18.5 LF-MCG/0.5 IM SUSP
0.5000 mL | Freq: Once | INTRAMUSCULAR | Status: AC
  Administered 2015-12-11: 0.5 mL via INTRAMUSCULAR
  Filled 2015-12-09: qty 0.5

## 2015-12-09 MED ORDER — DIBUCAINE 1 % RE OINT: 1.0000 "application " | TOPICAL_OINTMENT | RECTAL | Status: DC | PRN

## 2015-12-09 MED ORDER — FENTANYL CITRATE (PF) 100 MCG/2ML IJ SOLN
INTRAMUSCULAR | Status: AC
  Filled 2015-12-09: qty 2

## 2015-12-09 MED ORDER — SOD CITRATE-CITRIC ACID 500-334 MG/5ML PO SOLN
30.0000 mL | ORAL | Status: DC | PRN
  Administered 2015-12-09: 30 mL via ORAL
  Filled 2015-12-09: qty 15

## 2015-12-09 MED ORDER — FLEET ENEMA 7-19 GM/118ML RE ENEM: 1.0000 | ENEMA | RECTAL | Status: DC | PRN

## 2015-12-09 MED ORDER — TERBUTALINE SULFATE 1 MG/ML IJ SOLN
0.2500 mg | Freq: Once | INTRAMUSCULAR | Status: DC | PRN
  Filled 2015-12-09: qty 1

## 2015-12-09 NOTE — Progress Notes (Signed)
LABOR PROGRESS NOTE  Kimberly Fields is a 25 y.o. [id number] at 6454w2d  admitted for sol  Subjective: No pain s/p epidural  Objective: BP 109/61 mmHg  Pulse 112  Temp(Src) 98.3 F (36.8 C) (Oral)  Resp 15  Ht 5\' 4"  (1.626 m)  Wt 169 lb (76.658 kg)  BMI 28.99 kg/m2  SpO2 100% or  Filed Vitals:   12/09/15 1500 12/09/15 1530 12/09/15 1605 12/09/15 1637  BP: 112/70 102/60 100/61 109/61  Pulse: 95 98 90 112  Temp:      TempSrc:      Resp: 16 15 15 15   Height:      Weight:      SpO2:        140/mod/-a/-d  Dilation: 8 Effacement (%): 90 Cervical Position: Middle Station: -2 Presentation: Vertex Exam by:: Ayshia Gramlich, MD  Labs: Lab Results  Component Value Date   WBC 11.9* 12/09/2015   HGB 11.0* 12/09/2015   HCT 32.6* 12/09/2015   MCV 84.7 12/09/2015   PLT 200 12/09/2015    Patient Active Problem List   Diagnosis Date Noted  . Indication for care in labor or delivery 12/09/2015  . Iron deficiency anemia 11/06/2015  . Nausea without vomiting 11/06/2015  . Anemia affecting pregnancy in third trimester, antepartum 10/21/2015  . Supervision of normal pregnancy 08/25/2015  . Late prenatal care affecting pregnancy, antepartum 08/25/2015  . Short interval between pregnancies affecting pregnancy, antepartum 08/25/2015  . Anal fissure 11/12/2014    Assessment / Plan: 25 y.o. [id number] at 6654w2d here for sol  Labor: no sig change 3 hours, just now s/p arom clear, will start pit if no change at next cervical exam Fetal Wellbeing:  Cat 1 Pain Control:  S/p epidural Anticipated MOD:  vaginal  Silvano BilisNoah B Fanny Agan, MD 12/09/2015, 4:47 PM

## 2015-12-09 NOTE — Anesthesia Procedure Notes (Signed)
Epidural Patient location during procedure: OB Start time: 12/09/2015 1:43 PM  Staffing Anesthesiologist: Mal AmabileFOSTER, Kemontae Dunklee Performed by: anesthesiologist   Preanesthetic Checklist Completed: patient identified, site marked, surgical consent, pre-op evaluation, timeout performed, IV checked, risks and benefits discussed and monitors and equipment checked  Epidural Patient position: sitting Prep: site prepped and draped and DuraPrep Patient monitoring: continuous pulse ox and blood pressure Approach: midline Location: L3-L4 Injection technique: LOR air  Needle:  Needle type: Tuohy  Needle gauge: 17 G Needle length: 9 cm and 9 Needle insertion depth: 4 cm Catheter type: closed end flexible Catheter size: 19 Gauge Catheter at skin depth: 9 cm Test dose: negative and Other  Assessment Events: blood not aspirated, injection not painful, no injection resistance, negative IV test and no paresthesia  Additional Notes Patient identified. Risks and benefits discussed including failed block, incomplete  Pain control, post dural puncture headache, nerve damage, paralysis, blood pressure Changes, nausea, vomiting, reactions to medications-both toxic and allergic and post Partum back pain. All questions were answered. Patient expressed understanding and wished to proceed. Sterile technique was used throughout procedure. Epidural site was Dressed with sterile barrier dressing. No paresthesias, signs of intravascular injection Or signs of intrathecal spread were encountered.  Patient was more comfortable after the epidural was dosed. Please see RN's note for documentation of vital signs and FHR which are stable.

## 2015-12-09 NOTE — MAU Note (Signed)
PT  SAYS SHE  STARTED HURTING  BAD  AT 0100.   VE IN OFFICE  2-3  CM.    DENIES  HSV AND  MRSA.   GBS- NEG

## 2015-12-09 NOTE — Progress Notes (Signed)
Called to evaluated increased postpartum bleeding. EBL not yet calculated by nursing but they do not think totals > 300, so not technically pph. Has been passing some clots and some bright red blood. On exam fundus not very firm but no clots/bleeding when massaged. Perineum/vagina examined, no bleeding lacerations. Patient had just urinated. She had received methergine 0.2 mg IV prior to my arrival. I ordered cytotec 800 buccal and to continue methergine series, PO. I evacuated small/moderate amount of clot and small amount of membrane from uterus after giving fentanyl 100 mcg IV. Will have patient's BP monitored q 30 min for next couple of hours given methergine's inadvertent IV administration.

## 2015-12-09 NOTE — H&P (Signed)
LABOR AND DELIVERY ADMISSION HISTORY AND PHYSICAL NOTE  Kimberly Fields is a 25 y.o. female [id number] with IUP at 1549w2d by US on 08/11/15 presenting for active labor (7/50/0 on arrival).   She reports positive fetal movement. She denies leakage of fluid or vaginal bleeding.  Prenatal History/Complications:  Past Medical History: Past Medical History  Diagnosis Date  . Asthma   . UTI (lower urinary tract infection)   . Anemia     Past Surgical History: Past Surgical History  Procedure Laterality Date  . Tonsillectomy      Obstetrical History: OB History    Gravida Para Term Preterm AB TAB SAB Ectopic Multiple Living   5 1 1  0 3 1 2  0 0 1      Social History: Social History   Social History  . Marital Status: Single    Spouse Name: N/A  . Number of Children: N/A  . Years of Education: N/A   Social History Main Topics  . Smoking status: Never Smoker   . Smokeless tobacco: Never Used  . Alcohol Use: No  . Drug Use: No  . Sexual Activity: Yes    Birth Control/ Protection: None     Comment: has 1 son. Weekend job shift Production designer, theatre/television/filmmanager   Other Topics Concern  . None   Social History Narrative    Family History: Family History  Problem Relation Age of Onset  . Migraines Mother   . Asthma Mother   . Cancer Mother     cervical   . COPD Maternal Grandmother   . Cancer Maternal Grandmother     colon  . Anxiety disorder Maternal Grandmother   . Coronary artery disease Maternal Grandmother   . Cancer Maternal Grandfather     skin  . Coronary artery disease Maternal Grandfather   . Stroke Maternal Grandfather   . Tuberculosis Maternal Grandfather   . Bipolar disorder Brother     Allergies: Allergies  Allergen Reactions  . Red Dye Swelling    Prescriptions prior to admission  Medication Sig Dispense Refill Last Dose  . acetaminophen (TYLENOL) 325 MG tablet Take 650 mg by mouth every 6 (six) hours as needed for mild pain.    Past Week at Unknown time  . famotidine  (PEPCID) 20 MG tablet Take 20 mg by mouth 2 (two) times daily.   12/08/2015 at Unknown time  . ferrous sulfate 325 (65 FE) MG tablet Take 325 mg by mouth daily with breakfast.   Past Month at Unknown time  . Prenatal Vit-Fe Fumarate-FA (PRENATAL MULTIVITAMIN) TABS tablet Take 1 tablet by mouth daily at 12 noon.   12/08/2015 at Unknown time  . albuterol (PROVENTIL HFA;VENTOLIN HFA) 108 (90 Base) MCG/ACT inhaler Inhale 2 puffs into the lungs every 4 (four) hours as needed for wheezing or shortness of breath. 1 Inhaler 0 Rescue     Review of Systems   All systems reviewed and negative except as stated in HPI  Blood pressure 113/67, pulse 100, temperature 98.3 F (36.8 C), temperature source Oral, resp. rate 17, height 5\' 4"  (1.626 m), weight 76.658 kg (169 lb), SpO2 99 %, unknown if currently breastfeeding. General appearance: alert, cooperative and no distress Lungs: clear to auscultation bilaterally Heart: regular rate and rhythm Abdomen: soft, non-tender; bowel sounds normal Extremities: No calf swelling or tenderness Presentation: cephalic on exam Fetal monitoring: reassuring Uterine activity: regular contractions every 2-4 minutes Dilation: 7 Effacement (%): 90 Station: -1 Exam by:: Dorathy KinsmanVirginia Smith, CNM   Prenatal labs:  ABO, Rh: A/Positive/-- (03/20 1623) Antibody: Negative (03/20 1623) Rubella: !Error! Immune RPR: Non Reactive (03/20 1623)  HBsAg: Negative (03/20 1623)  HIV: Non Reactive (03/20 1623)  GBS: Negative (06/08 1530)  1 hr Glucola: 2 hr glucose tolerance test passed Genetic screening:  Too late in care Anatomy US: normal female  Prenatal Transfer Tool  Maternal Diabetes: No Genetic Screening: Declined Maternal Ultrasounds/Referrals: Normal Fetal Ultrasounds or other Referrals:  None Maternal Substance Abuse:  No Significant Maternal Medications:  None Significant Maternal Lab Results: None  Results for orders placed or performed during the hospital encounter of  12/09/15 (from the past 24 hour(s))  CBC   Collection Time: 12/09/15  1:05 PM  Result Value Ref Range   WBC 11.9 (H) 4.0 - 10.5 K/uL   RBC 3.85 (L) 3.87 - 5.11 MIL/uL   Hemoglobin 11.0 (L) 12.0 - 15.0 g/dL   HCT 16.132.6 (L) 09.636.0 - 04.546.0 %   MCV 84.7 78.0 - 100.0 fL   MCH 28.6 26.0 - 34.0 pg   MCHC 33.7 30.0 - 36.0 g/dL   RDW 40.921.2 (H) 81.111.5 - 91.415.5 %   Platelets 200 150 - 400 K/uL    Patient Active Problem List   Diagnosis Date Noted  . Indication for care in labor or delivery 12/09/2015  . Iron deficiency anemia 11/06/2015  . Nausea without vomiting 11/06/2015  . Anemia affecting pregnancy in third trimester, antepartum 10/21/2015  . Supervision of normal pregnancy 08/25/2015  . Late prenatal care affecting pregnancy, antepartum 08/25/2015  . Short interval between pregnancies affecting pregnancy, antepartum 08/25/2015  . Anal fissure 11/12/2014    Assessment: Kimberly Fields is a 25 y.o. [id number] at 3987w2d here for SOL   #Labor: 7cm with a bulging bag and head well applied #Pain: Controlled with epidural #FWB: Category I tracing #ID:  Not indicated #MOF: bottle #MOC: undecided #Circ:  desired  Loni MuseKate Timberlake 12/09/2015, 1:59 PM   OB FELLOW HISTORY AND PHYSICAL ATTESTATION  I have seen and examined this patient; I agree with above documentation in the resident's note.    Cherrie Gauzeoah B Khalin Royce 12/09/2015, 3:14 PM

## 2015-12-09 NOTE — Progress Notes (Signed)
Dr. Ottie GlazierGunadasa notified that pt changed from 2.5 to 3 but pt wants to go home so she can take some medication and walk and come back when she is in labor.  Provider states to discharge pt with labor precautions.

## 2015-12-09 NOTE — Anesthesia Preprocedure Evaluation (Signed)
Anesthesia Evaluation  Patient identified by MRN, date of birth, ID band Patient awake    Reviewed: Allergy & Precautions, Patient's Chart, lab work & pertinent test results  Airway Mallampati: II  TM Distance: >3 FB Neck ROM: Full    Dental no notable dental hx. (+) Teeth Intact   Pulmonary asthma ,    Pulmonary exam normal breath sounds clear to auscultation       Cardiovascular negative cardio ROS Normal cardiovascular exam Rhythm:Regular Rate:Normal     Neuro/Psych negative neurological ROS  negative psych ROS   GI/Hepatic Neg liver ROS, GERD  Medicated and Controlled,  Endo/Other  negative endocrine ROS  Renal/GU negative Renal ROS  negative genitourinary   Musculoskeletal negative musculoskeletal ROS (+)   Abdominal   Peds  Hematology  (+) anemia ,   Anesthesia Other Findings   Reproductive/Obstetrics (+) Pregnancy                             Anesthesia Physical Anesthesia Plan  ASA: II  Anesthesia Plan: Epidural   Post-op Pain Management:    Induction:   Airway Management Planned: Natural Airway  Additional Equipment:   Intra-op Plan:   Post-operative Plan:   Informed Consent: I have reviewed the patients History and Physical, chart, labs and discussed the procedure including the risks, benefits and alternatives for the proposed anesthesia with the patient or authorized representative who has indicated his/her understanding and acceptance.     Plan Discussed with: Anesthesiologist  Anesthesia Plan Comments:         Anesthesia Quick Evaluation

## 2015-12-09 NOTE — Discharge Instructions (Signed)
Third Trimester of Pregnancy °The third trimester is from week 29 through week 42, months 7 through 9. The third trimester is a time when the fetus is growing rapidly. At the end of the ninth month, the fetus is about 20 inches in length and weighs 6-10 pounds.  °BODY CHANGES °Your body goes through many changes during pregnancy. The changes vary from woman to woman.  °· Your weight will continue to increase. You can expect to gain 25-35 pounds (11-16 kg) by the end of the pregnancy. °· You may begin to get stretch marks on your hips, abdomen, and breasts. °· You may urinate more often because the fetus is moving lower into your pelvis and pressing on your bladder. °· You may develop or continue to have heartburn as a result of your pregnancy. °· You may develop constipation because certain hormones are causing the muscles that push waste through your intestines to slow down. °· You may develop hemorrhoids or swollen, bulging veins (varicose veins). °· You may have pelvic pain because of the weight gain and pregnancy hormones relaxing your joints between the bones in your pelvis. Backaches may result from overexertion of the muscles supporting your posture. °· You may have changes in your hair. These can include thickening of your hair, rapid growth, and changes in texture. Some women also have hair loss during or after pregnancy, or hair that feels dry or thin. Your hair will most likely return to normal after your baby is born. °· Your breasts will continue to grow and be tender. A yellow discharge may leak from your breasts called colostrum. °· Your belly button may stick out. °· You may feel short of breath because of your expanding uterus. °· You may notice the fetus "dropping," or moving lower in your abdomen. °· You may have a bloody mucus discharge. This usually occurs a few days to a week before labor begins. °· Your cervix becomes thin and soft (effaced) near your due date. °WHAT TO EXPECT AT YOUR PRENATAL  EXAMS  °You will have prenatal exams every 2 weeks until week 36. Then, you will have weekly prenatal exams. During a routine prenatal visit: °· You will be weighed to make sure you and the fetus are growing normally. °· Your blood pressure is taken. °· Your abdomen will be measured to track your baby's growth. °· The fetal heartbeat will be listened to. °· Any test results from the previous visit will be discussed. °· You may have a cervical check near your due date to see if you have effaced. °At around 36 weeks, your caregiver will check your cervix. At the same time, your caregiver will also perform a test on the secretions of the vaginal tissue. This test is to determine if a type of bacteria, Group B streptococcus, is present. Your caregiver will explain this further. °Your caregiver may ask you: °· What your birth plan is. °· How you are feeling. °· If you are feeling the baby move. °· If you have had any abnormal symptoms, such as leaking fluid, bleeding, severe headaches, or abdominal cramping. °· If you are using any tobacco products, including cigarettes, chewing tobacco, and electronic cigarettes. °· If you have any questions. °Other tests or screenings that may be performed during your third trimester include: °· Blood tests that check for low iron levels (anemia). °· Fetal testing to check the health, activity level, and growth of the fetus. Testing is done if you have certain medical conditions or if   there are problems during the pregnancy. °· HIV (human immunodeficiency virus) testing. If you are at high risk, you may be screened for HIV during your third trimester of pregnancy. °FALSE LABOR °You may feel small, irregular contractions that eventually go away. These are called Braxton Hicks contractions, or false labor. Contractions may last for hours, days, or even weeks before true labor sets in. If contractions come at regular intervals, intensify, or become painful, it is best to be seen by your  caregiver.  °SIGNS OF LABOR  °· Menstrual-like cramps. °· Contractions that are 5 minutes apart or less. °· Contractions that start on the top of the uterus and spread down to the lower abdomen and back. °· A sense of increased pelvic pressure or back pain. °· A watery or bloody mucus discharge that comes from the vagina. °If you have any of these signs before the 37th week of pregnancy, call your caregiver right away. You need to go to the hospital to get checked immediately. °HOME CARE INSTRUCTIONS  °· Avoid all smoking, herbs, alcohol, and unprescribed drugs. These chemicals affect the formation and growth of the baby. °· Do not use any tobacco products, including cigarettes, chewing tobacco, and electronic cigarettes. If you need help quitting, ask your health care provider. You may receive counseling support and other resources to help you quit. °· Follow your caregiver's instructions regarding medicine use. There are medicines that are either safe or unsafe to take during pregnancy. °· Exercise only as directed by your caregiver. Experiencing uterine cramps is a good sign to stop exercising. °· Continue to eat regular, healthy meals. °· Wear a good support bra for breast tenderness. °· Do not use hot tubs, steam rooms, or saunas. °· Wear your seat belt at all times when driving. °· Avoid raw meat, uncooked cheese, cat litter boxes, and soil used by cats. These carry germs that can cause birth defects in the baby. °· Take your prenatal vitamins. °· Take 1500-2000 mg of calcium daily starting at the 20th week of pregnancy until you deliver your baby. °· Try taking a stool softener (if your caregiver approves) if you develop constipation. Eat more high-fiber foods, such as fresh vegetables or fruit and whole grains. Drink plenty of fluids to keep your urine clear or pale yellow. °· Take warm sitz baths to soothe any pain or discomfort caused by hemorrhoids. Use hemorrhoid cream if your caregiver approves. °· If  you develop varicose veins, wear support hose. Elevate your feet for 15 minutes, 3-4 times a day. Limit salt in your diet. °· Avoid heavy lifting, wear low heal shoes, and practice good posture. °· Rest a lot with your legs elevated if you have leg cramps or low back pain. °· Visit your dentist if you have not gone during your pregnancy. Use a soft toothbrush to brush your teeth and be gentle when you floss. °· A sexual relationship may be continued unless your caregiver directs you otherwise. °· Do not travel far distances unless it is absolutely necessary and only with the approval of your caregiver. °· Take prenatal classes to understand, practice, and ask questions about the labor and delivery. °· Make a trial run to the hospital. °· Pack your hospital bag. °· Prepare the baby's nursery. °· Continue to go to all your prenatal visits as directed by your caregiver. °SEEK MEDICAL CARE IF: °· You are unsure if you are in labor or if your water has broken. °· You have dizziness. °· You have   mild pelvic cramps, pelvic pressure, or nagging pain in your abdominal area. °· You have persistent nausea, vomiting, or diarrhea. °· You have a bad smelling vaginal discharge. °· You have pain with urination. °SEEK IMMEDIATE MEDICAL CARE IF:  °· You have a fever. °· You are leaking fluid from your vagina. °· You have spotting or bleeding from your vagina. °· You have severe abdominal cramping or pain. °· You have rapid weight loss or gain. °· You have shortness of breath with chest pain. °· You notice sudden or extreme swelling of your face, hands, ankles, feet, or legs. °· You have not felt your baby move in over an hour. °· You have severe headaches that do not go away with medicine. °· You have vision changes. °  °This information is not intended to replace advice given to you by your health care provider. Make sure you discuss any questions you have with your health care provider. °  °Document Released: 05/18/2001 Document  Revised: 06/14/2014 Document Reviewed: 07/25/2012 °Elsevier Interactive Patient Education ©2016 [employer] °Fetal Movement Counts °Patient Name: __________________________________________________ Patient Due Date: ____________________ °Performing a fetal movement count is highly recommended in high-risk pregnancies, but it is good for every pregnant woman to do. Your health care provider may ask you to start counting fetal movements at 28 weeks of the pregnancy. Fetal movements often increase: °· After eating a full meal. °· After physical activity. °· After eating or drinking something sweet or cold. °· At rest. °Pay attention to when you feel the baby is most active. This will help you notice a pattern of your baby's sleep and wake cycles and what factors contribute to an increase in fetal movement. It is important to perform a fetal movement count at the same time each day when your baby is normally most active.  °HOW TO COUNT FETAL MOVEMENTS °· Find a quiet and comfortable area to sit or lie down on your left side. Lying on your left side provides the best blood and oxygen circulation to your baby. °· Write down the day and time on a sheet of paper or in a journal. °· Start counting kicks, flutters, swishes, rolls, or jabs in a 2-hour period. You should feel at least 10 movements within 2 hours. °· If you do not feel 10 movements in 2 hours, wait 2-3 hours and count again. Look for a change in the pattern or not enough counts in 2 hours. °SEEK MEDICAL CARE IF: °· You feel less than 10 counts in 2 hours, tried twice. °· There is no movement in over an hour. °· The pattern is changing or taking longer each day to reach 10 counts in 2 hours. °· You feel the baby is not moving as he or she usually does. °Date: ____________ Movements: ____________ Start time: ____________ Finish time: ____________  °Date: ____________ Movements: ____________ Start time: ____________ Finish time: ____________ °Date: ____________  Movements: ____________ Start time: ____________ Finish time: ____________ °Date: ____________ Movements: ____________ Start time: ____________ Finish time: ____________ °Date: ____________ Movements: ____________ Start time: ____________ Finish time: ____________ °Date: ____________ Movements: ____________ Start time: ____________ Finish time: ____________ °Date: ____________ Movements: ____________ Start time: ____________ Finish time: ____________ °Date: ____________ Movements: ____________ Start time: ____________ Finish time: ____________  °Date: ____________ Movements: ____________ Start time: ____________ Finish time: ____________ °Date: ____________ Movements: ____________ Start time: ____________ Finish time: ____________ °Date: ____________ Movements: ____________ Start time: ____________ Finish time: ____________ °Date: ____________ Movements: ____________ Start time: ____________ Finish time: ____________ °Date:   ____________ Movements: ____________ Start time: ____________ Finish time: ____________ °Date: ____________ Movements: ____________ Start time: ____________ Finish time: ____________ °Date: ____________ Movements: ____________ Start time: ____________ Finish time: ____________  °Date: ____________ Movements: ____________ Start time: ____________ Finish time: ____________ °Date: ____________ Movements: ____________ Start time: ____________ Finish time: ____________ °Date: ____________ Movements: ____________ Start time: ____________ Finish time: ____________ °Date: ____________ Movements: ____________ Start time: ____________ Finish time: ____________ °Date: ____________ Movements: ____________ Start time: ____________ Finish time: ____________ °Date: ____________ Movements: ____________ Start time: ____________ Finish time: ____________ °Date: ____________ Movements: ____________ Start time: ____________ Finish time: ____________  °Date: ____________ Movements: ____________ Start time:  ____________ Finish time: ____________ °Date: ____________ Movements: ____________ Start time: ____________ Finish time: ____________ °Date: ____________ Movements: ____________ Start time: ____________ Finish time: ____________ °Date: ____________ Movements: ____________ Start time: ____________ Finish time: ____________ °Date: ____________ Movements: ____________ Start time: ____________ Finish time: ____________ °Date: ____________ Movements: ____________ Start time: ____________ Finish time: ____________ °Date: ____________ Movements: ____________ Start time: ____________ Finish time: ____________  °Date: ____________ Movements: ____________ Start time: ____________ Finish time: ____________ °Date: ____________ Movements: ____________ Start time: ____________ Finish time: ____________ °Date: ____________ Movements: ____________ Start time: ____________ Finish time: ____________ °Date: ____________ Movements: ____________ Start time: ____________ Finish time: ____________ °Date: ____________ Movements: ____________ Start time: ____________ Finish time: ____________ °Date: ____________ Movements: ____________ Start time: ____________ Finish time: ____________ °Date: ____________ Movements: ____________ Start time: ____________ Finish time: ____________  °Date: ____________ Movements: ____________ Start time: ____________ Finish time: ____________ °Date: ____________ Movements: ____________ Start time: ____________ Finish time: ____________ °Date: ____________ Movements: ____________ Start time: ____________ Finish time: ____________ °Date: ____________ Movements: ____________ Start time: ____________ Finish time: ____________ °Date: ____________ Movements: ____________ Start time: ____________ Finish time: ____________ °Date: ____________ Movements: ____________ Start time: ____________ Finish time: ____________ °Date: ____________ Movements: ____________ Start time: ____________ Finish time: ____________  °Date:  ____________ Movements: ____________ Start time: ____________ Finish time: ____________ °Date: ____________ Movements: ____________ Start time: ____________ Finish time: ____________ °Date: ____________ Movements: ____________ Start time: ____________ Finish time: ____________ °Date: ____________ Movements: ____________ Start time: ____________ Finish time: ____________ °Date: ____________ Movements: ____________ Start time: ____________ Finish time: ____________ °Date: ____________ Movements: ____________ Start time: ____________ Finish time: ____________ °Date: ____________ Movements: ____________ Start time: ____________ Finish time: ____________  °Date: ____________ Movements: ____________ Start time: ____________ Finish time: ____________ °Date: ____________ Movements: ____________ Start time: ____________ Finish time: ____________ °Date: ____________ Movements: ____________ Start time: ____________ Finish time: ____________ °Date: ____________ Movements: ____________ Start time: ____________ Finish time: ____________ °Date: ____________ Movements: ____________ Start time: ____________ Finish time: ____________ °Date: ____________ Movements: ____________ Start time: ____________ Finish time: ____________ °  °This information is not intended to replace advice given to you by your health care provider. Make sure you discuss any questions you have with your health care provider. °  °Document Released: 06/23/2006 Document Revised: 06/14/2014 Document Reviewed: 03/20/2012 °Elsevier Interactive Patient Education ©2016 [employer] °Braxton Hicks Contractions °Contractions of the uterus can occur throughout pregnancy. Contractions are not always a sign that you are in labor.  °WHAT ARE BRAXTON HICKS CONTRACTIONS?  °Contractions that occur before labor are called Braxton Hicks contractions, or false labor. Toward the end of pregnancy (32-34 weeks), these contractions can develop more often and may become more  forceful. This is not true labor because these contractions do not result in opening (dilatation) and thinning of the cervix. They are sometimes difficult to tell apart from true labor because these contractions can be forceful and people have different pain tolerances. You should   not feel embarrassed if you go to the hospital with false labor. Sometimes, the only way to tell if you are in true labor is for your health care provider to look for changes in the cervix. °If there are no prenatal problems or other health problems associated with the pregnancy, it is completely safe to be sent home with false labor and await the onset of true labor. °HOW CAN YOU TELL THE DIFFERENCE BETWEEN TRUE AND FALSE LABOR? °False Labor °· The contractions of false labor are usually shorter and not as hard as those of true labor.   °· The contractions are usually irregular.   °· The contractions are often felt in the front of the lower abdomen and in the groin.   °· The contractions may go away when you walk around or change positions while lying down.   °· The contractions get weaker and are shorter lasting as time goes on.   °· The contractions do not usually become progressively stronger, regular, and closer together as with true labor.   °True Labor °· Contractions in true labor last 30-70 seconds, become very regular, usually become more intense, and increase in frequency.   °· The contractions do not go away with walking.   °· The discomfort is usually felt in the top of the uterus and spreads to the lower abdomen and low back.   °· True labor can be determined by your health care provider with an exam. This will show that the cervix is dilating and getting thinner.   °WHAT TO REMEMBER °· Keep up with your usual exercises and follow other instructions given by your health care provider.   °· Take medicines as directed by your health care provider.   °· Keep your regular prenatal appointments.   °· Eat and drink lightly if you  think you are going into labor.   °· If Braxton Hicks contractions are making you uncomfortable:   °· Change your position from lying down or resting to walking, or from walking to resting.   °· Sit and rest in a tub of warm water.   °· Drink 2-3 glasses of water. Dehydration may cause these contractions.   °· Do slow and deep breathing several times an hour.   °WHEN SHOULD I SEEK IMMEDIATE MEDICAL CARE? °Seek immediate medical care if: °· Your contractions become stronger, more regular, and closer together.   °· You have fluid leaking or gushing from your vagina.   °· You have a fever.   °· You pass blood-tinged mucus.   °· You have vaginal bleeding.   °· You have continuous abdominal pain.   °· You have low back pain that you never had before.   °· You feel your baby's head pushing down and causing pelvic pressure.   °· Your baby is not moving as much as it used to.   °  °This information is not intended to replace advice given to you by your health care provider. Make sure you discuss any questions you have with your health care provider. °  °Document Released: 05/24/2005 Document Revised: 05/29/2013 Document Reviewed: 03/05/2013 °Elsevier Interactive Patient Education ©2016 [employer] ° °

## 2015-12-09 NOTE — Anesthesia Pain Management Evaluation Note (Signed)
  CRNA Pain Management Visit Note  Patient: Kimberly EtienneAmity Biscardi, 25 y.o., female  "Hello I am a member of the anesthesia team at [facility]. We have an anesthesia team available at all times to provide care throughout the hospital, including epidural management and anesthesia for C-section. I don't know your plan for the delivery whether it a natural birth, water birth, IV sedation, nitrous supplementation, doula or epidural, but we want to meet your pain goals."   1.Was your pain managed to your expectations on prior hospitalizations?   Yes   2.What is your expectation for pain management during this hospitalization?     Epidural  3.How can we help you reach that goal? epidural  Record the patient's initial score and the patient's pain goal.   Pain: 2  Pain Goal: 2 [facility] wants you to be able to say your pain was always managed very well.  Cephus ShellingBURGER,Aalaysia Liggins 12/09/2015

## 2015-12-09 NOTE — Progress Notes (Signed)
LABOR PROGRESS NOTE  Kimberly Fields is a 25 y.o. [id number] at 8510w2d  admitted for sol  Subjective: No pain s/p epidural  Objective: BP 102/64 mmHg  Pulse 79  Temp(Src) 98.3 F (36.8 C) (Oral)  Resp 16  Ht 5\' 4"  (1.626 m)  Wt 169 lb (76.658 kg)  BMI 28.99 kg/m2  SpO2 100% or  Filed Vitals:   12/09/15 1637 12/09/15 1701 12/09/15 1731 12/09/15 1802  BP: 109/61 109/68 102/63 102/64  Pulse: 112 97 102 79  Temp:  98.3 F (36.8 C)    TempSrc:  Oral    Resp: 15 15 15 16   Height:      Weight:      SpO2:        140/mod/-a/-d  Dilation: 8.5 Effacement (%): 90 Cervical Position: Middle Station: -2 Presentation: Vertex Exam by:: Craige CottaAmanda loye, RN  Labs: Lab Results  Component Value Date   WBC 11.9* 12/09/2015   HGB 11.0* 12/09/2015   HCT 32.6* 12/09/2015   MCV 84.7 12/09/2015   PLT 200 12/09/2015    Patient Active Problem List   Diagnosis Date Noted  . Indication for care in labor or delivery 12/09/2015  . Iron deficiency anemia 11/06/2015  . Nausea without vomiting 11/06/2015  . Anemia affecting pregnancy in third trimester, antepartum 10/21/2015  . Supervision of normal pregnancy 08/25/2015  . Late prenatal care affecting pregnancy, antepartum 08/25/2015  . Short interval between pregnancies affecting pregnancy, antepartum 08/25/2015  . Anal fissure 11/12/2014    Assessment / Plan: 25 y.o. G5P1031 at 3710w2d here for sol  Labor: arom 2 hours ago, no significant cervical change, will start pitocin Fetal Wellbeing:  Cat 1 Pain Control:  S/p epidural Anticipated MOD:  vaginal  Silvano BilisNoah B Lucretia Pendley, MD 12/09/2015, 6:40 PM

## 2015-12-09 NOTE — Progress Notes (Addendum)
Received call from nursing to inform patient had postpartum bleeding.   Nursing reported of boggy uterus with "gushing" during uterine massage. After 10 minutes no improvement, so nursing called for Methergine. Went to evaluate patient. Did note somewhat boggy uterus but no pooling of blood with uterine massage. Nursing not quite sure of the amount of bleeding.   Will proceed with IV Methergine (no history of HTN per patient) Will also place CBC in AM  Palma HolterKanishka G Venesa Semidey, MD PGY 2 Family Medicine

## 2015-12-10 LAB — CBC
HEMATOCRIT: 30.1 % — AB (ref 36.0–46.0)
HEMOGLOBIN: 10.2 g/dL — AB (ref 12.0–15.0)
MCH: 28.8 pg (ref 26.0–34.0)
MCHC: 33.9 g/dL (ref 30.0–36.0)
MCV: 85 fL (ref 78.0–100.0)
Platelets: 168 10*3/uL (ref 150–400)
RBC: 3.54 MIL/uL — ABNORMAL LOW (ref 3.87–5.11)
RDW: 21 % — ABNORMAL HIGH (ref 11.5–15.5)
WBC: 14.1 10*3/uL — ABNORMAL HIGH (ref 4.0–10.5)

## 2015-12-10 LAB — GLUCOSE, RANDOM: GLUCOSE: 102 mg/dL — AB (ref 65–99)

## 2015-12-10 MED ORDER — METHYLERGONOVINE MALEATE 0.2 MG/ML IJ SOLN: 0.2000 mg | Freq: Once | INTRAMUSCULAR | Status: DC

## 2015-12-10 NOTE — Progress Notes (Signed)
RN to hourly round and assess pt.  Mom felt warm and had a temp of 102, and pain of 8.  Motrin 600mg  given as scheduled.  Morphine 2 mg IV pulled to give, although held when pt c/o SOB and difficulty swallowing and 'swelling of her throat'.  Dr. Ottie GlazierGunadasa called to bedside for further assessment.  Dr. Ashok PallWouk followed shortly behind.  Tylenol 650mg  were the only new orders at this time.  Dr. Ashok PallWouk agreed with  RN to hold Morphine.  Will continue to monitor q5441m and report any changes.  CBC was also ordered earlier in the shift for PP bleeding.  Mom tolerating well, and in good spirits.

## 2015-12-10 NOTE — Progress Notes (Signed)
RN called to nurses station by Dr. Ashok PallWouk.  Dr. Ashok PallWouk informed RN never to give Methergine IV, only IM.  RN told Dr. Ashok PallWouk the IV dose was ordered verbally by Dr. Ottie GlazierGunadasa as well as displayed in the [facility] to be given IV or IM.  RN told Dr. Ashok PallWouk if that was a contraindication, then the teaching staff should inform pharmacy of this error.

## 2015-12-10 NOTE — Progress Notes (Signed)
Assumed care of mom and baby.  L&D nurse reported midline, firm fundus U/3 with minimal bleeding.  She also stated mom had not been up to void as her legs were still shaky from her epidural.

## 2015-12-10 NOTE — Progress Notes (Signed)
Called by nursing to inform of temp of 102F and patient's report of subjective throat swelling.   Went to evaluate patient with Dr. Ashok PallWouk who reports she has intermittent pelvic pain since manual evacuation of small clots from uterus. Also reports that when she woke up, she felt that her throat was swollen and possibly the back on her tongue. She denies any abdominal pain. Reports that she has shortness of breath when her pelvic pain woke her up; she denies any shortness of breath or wheezing now. Denies any rashes.   She has had only rashes as allergic reactions in the past.   On exam, vitals are stable and wnl, patient is nontoxic appearing and in no distress Pharynx: unremarkable, normal in appearance  CV: RRR, no m/r/g Lung: no signs of increased wob, CTAB without wheezing Skin: no rashes/hives   Patient was given scheduled motrin and PRN Tylenol for Fever, which is likely side effect of Cytotec given earlier for PP bleeding. Will continue to monitor.

## 2015-12-10 NOTE — Progress Notes (Signed)
Mom crying and in a lot of pain from the fundal.  RN gave PO Percocet 10 mg, Zofran 4 mg IV, and Benadryl 12.5mg  IV.

## 2015-12-10 NOTE — Anesthesia Postprocedure Evaluation (Signed)
Anesthesia Post Note  Patient: Kimberly Fields  Procedure(s) Performed: * No procedures listed *  Patient location during evaluation: Mother Baby Anesthesia Type: Epidural Level of consciousness: awake and alert Pain management: pain level controlled Vital Signs Assessment: post-procedure vital signs reviewed and stable Respiratory status: spontaneous breathing, nonlabored ventilation and respiratory function stable Cardiovascular status: stable Postop Assessment: no headache, no backache and epidural receding Anesthetic complications: no     Last Vitals:  Filed Vitals:   12/10/15 0300 12/10/15 0550  BP:  106/93  Pulse:  87  Temp: 38.5 C 37.4 C  Resp:  20    Last Pain:  Filed Vitals:   12/10/15 0756  PainSc: 0-No pain   Pain Goal: Patients Stated Pain Goal: 2 (12/09/15 1356)               Junious SilkGILBERT,Basel Defalco

## 2015-12-10 NOTE — Progress Notes (Signed)
Dr. Ottie GlazierGunadasa called to bedside again as pt still gushing blood, pooling in pad approximately 30 minutes after orders for IV Methergine 0.2 was given.  Dr. Ottie GlazierGunadasa called Dr. Ashok PallWouk to bedside for further assessment.  No pads had been weighed at this point.  Dr. Ashok PallWouk verbally ordered 800 mg of buccal Cytotec and 100 mcg of  Fentanyl  IV push prior to massaging the uterus, then inserting his hand into to uterus and removing clots and retained placenta.  Alexia FreestonePatty, RN, and Alyssa, RN at bedside to confirm verbal orders and findings.

## 2015-12-10 NOTE — Progress Notes (Signed)
Post Partum Day 1  Subjective:  Kimberly Fields is a 25 y.o. [id number] 5148w2d s/p SVD.  See previous note from Dr. Ottie GlazierGunadasa regarding PP trickle and subjective throat pain. Lochia is medium red and moderate in quantity, not saturating more than 1 pad per hour on exam this morning. Throat pain has resolved.  Pt denies problems with ambulating, voiding or po intake.  She denies nausea or vomiting. Pain is well controlled.  She has had flatus. She has not had bowel movement.  Lochia Moderate.  Plan for birth control is IUD.  Method of Feeding: both  Objective: BP 106/93 mmHg  Pulse 87  Temp(Src) 99.4 F (37.4 C) (Oral)  Resp 20  Ht 5\' 4"  (1.626 m)  Wt 76.658 kg (169 lb)  BMI 28.99 kg/m2  SpO2 98%  Breastfeeding? Unknown  Physical Exam:  General: alert, cooperative and no distress Lochia:normal flow Chest: CTAB Heart: RRR no m/r/g Abdomen: +BS, soft, nontender, fundus firm at/below umbilicus Uterine Fundus: firm, nontender DVT Evaluation: No evidence of DVT seen on physical exam. Extremities: no edema   Recent Labs  12/09/15 1305 12/10/15 0540  HGB 11.0* 10.2*  HCT 32.6* 30.1*    Assessment/Plan:  ASSESSMENT: Kimberly Fields is a 25 y.o. [id number] 3948w2d ppd #1 s/p NSVD doing well.   Plan for discharge tomorrow and Breastfeeding   LOS: 1 day   Loni MuseKate Timberlake 12/10/2015, 7:28 AM   Midwife attestation Post Partum Day 1 I have seen and examined this patient and agree with above documentation in the resident's note.   Kimberly Fields is a 25 y.o. [id number] s/p SVD.  Pt denies problems with ambulating, voiding or po intake. Pain is well controlled.  Plan for birth control is IUD.  Method of Feeding: bottle  PE:  BP 106/93 mmHg  Pulse 87  Temp(Src) 99.4 F (37.4 C) (Oral)  Resp 20  Ht 5\' 4"  (1.626 m)  Wt 169 lb (76.658 kg)  BMI 28.99 kg/m2  SpO2 98%  Breastfeeding? Unknown Gen: well appearing Heart: reg rate Lungs: normal WOB Fundus firm u/2, lochia small Ext: soft, no  pain, no edema  A/Plan for discharge: tomorrow Bleeding stable-continue Methergine series Postpartum fever-no evidence of infection   Donette LarryMelanie Udell Blasingame, CNM 10:49 AM

## 2015-12-10 NOTE — Progress Notes (Signed)
RN returned to assess mom yet again.  The uterus was boggy, and blood began to gush again.  Patty, RN came to reassess pt.  We both decided firmly massaged the uterus, and asked Alyssa to come in for another set of eyes.  After much massage, and continued bleeding, RN called the resident on call, Dr. Ottie GlazierGunadasa.  Dr. Ottie GlazierGunadasa was informed of the situation, gave a verbal order with read back for Methergine 0.2mg  IV now, and came to assess pt at bedside.  The Methergine was given IV as ordered at approximately 1045.

## 2015-12-10 NOTE — Progress Notes (Signed)
Encouraged pt to get up and go to the bathroom as uterus was boggy and pt c/o of blood 'gushing'. While massaging the boggy uterus, mom was gushing a moderate amount into the pad.  Got mom to bathroom and ensured she was stable so RN could get another nurse for a second opinion.  Patty, RN observed pt, and helped get pt back to bed afgfor a second opinion.  We cleaned mom up and massaged the uterus again.   The uterus was not firm, but not completely boggy at this point.  The gushing had stopped for the moment.  RN set a timer for 10 minutes to return to assess.

## 2015-12-11 ENCOUNTER — Encounter: Payer: BLUE CROSS/BLUE SHIELD | Admitting: Women's Health

## 2015-12-11 LAB — RPR: RPR: NONREACTIVE

## 2015-12-11 MED ORDER — IBUPROFEN 600 MG PO TABS: 600.0000 mg | ORAL_TABLET | Freq: Four times a day (QID) | ORAL | Status: DC | PRN

## 2015-12-11 NOTE — Discharge Summary (Signed)
OB Discharge Summary     Patient Name: Kimberly Fields DOB: 06/02/1991 MRN: [id number]  Date of admission: 12/09/2015 Delivering MD: Shonna ChockWOUK, NOAH BEDFORD   Date of discharge: 12/11/2015  Admitting diagnosis: 40WKS CTX 2MINS Intrauterine pregnancy: 554w2d     Secondary diagnosis:  Active Problems:   Indication for care in labor or delivery  Additional problems: hx of anemia  Discharge diagnosis: Term Pregnancy Delivered                                                                                                Post partum procedures:none  Augmentation: AROM  Complications: post partum trickle s/p cytotec, methergine series, and manual sweep with ancef x 1  Hospital course:  Onset of Labor With Vaginal Delivery     25 y.o. yo [id number] at 314w2d was admitted in Active Labor on 12/09/2015. Patient had an uncomplicated labor course as follows:  Membrane Rupture Time/Date: 4:44 PM ,12/09/2015   Intrapartum Procedures: Episiotomy: None [1]                                         Lacerations:  1st degree [2]  Patient had a delivery of a Viable infant. 12/09/2015  Information for the patient's newborn:  Alverda Skeanslunkett, Boy Hellen [[id number]][[id number]]  Delivery Method: Vaginal, Spontaneous Delivery (Filed from Delivery Summary)    Pateint had an uncomplicated postpartum course.  She is ambulating, tolerating a regular diet, passing flatus, and urinating well. Patient is discharged home in stable condition on 12/11/2015.    Physical exam  Filed Vitals:   12/10/15 0300 12/10/15 0550 12/10/15 1800 12/11/15 0648  BP:  106/93 106/68 97/57  Pulse:  87 75 71  Temp: 101.3 F (38.5 C) 99.4 F (37.4 C) 97.6 F (36.4 C) 97.6 F (36.4 C)  TempSrc: Oral Oral Oral Oral  Resp:  20 18 18   Height:      Weight:      SpO2:  98% 98%    General: alert, cooperative and no distress Lochia: appropriate Uterine Fundus: firm Incision: N/A DVT Evaluation: No evidence of DVT seen on physical exam. No significant  calf/ankle edema. Labs: Lab Results  Component Value Date   WBC 14.1* 12/10/2015   HGB 10.2* 12/10/2015   HCT 30.1* 12/10/2015   MCV 85.0 12/10/2015   PLT 168 12/10/2015   CMP Latest Ref Rng 12/10/2015  Glucose 65 - 99 mg/dL 130(Q102(H)  BUN 6 - 20 mg/dL -  Creatinine 6.570.44 - 8.461.00 mg/dL -  Sodium 962135 - 952145 mmol/L -  Potassium 3.5 - 5.1 mmol/L -  Chloride 101 - 111 mmol/L -  CO2 22 - 32 mmol/L -  Calcium 8.9 - 10.3 mg/dL -  Total Protein 6.5 - 8.1 g/dL -  Total Bilirubin 0.3 - 1.2 mg/dL -  Alkaline Phos 38 - 841126 U/L -  AST 15 - 41 U/L -  ALT 14 - 54 U/L -    Discharge instruction: per After Visit Summary and "Baby and Me Booklet".  After visit meds:    Medication List    STOP taking these medications        famotidine 20 MG tablet  Commonly known as:  PEPCID     ferrous sulfate 325 (65 FE) MG tablet     prenatal multivitamin Tabs tablet      TAKE these medications        acetaminophen 325 MG tablet  Commonly known as:  TYLENOL  Take 650 mg by mouth every 6 (six) hours as needed for mild pain.     albuterol 108 (90 Base) MCG/ACT inhaler  Commonly known as:  PROVENTIL HFA;VENTOLIN HFA  Inhale 2 puffs into the lungs every 4 (four) hours as needed for wheezing or shortness of breath.     ibuprofen 600 MG tablet  Commonly known as:  ADVIL,MOTRIN  Take 1 tablet (600 mg total) by mouth every 6 (six) hours as needed for mild pain or moderate pain.        Diet: routine diet  Activity: Advance as tolerated. Pelvic rest for 6 weeks.   Outpatient follow up:6 weeks Follow up Appt:Future Appointments Date Time Provider Department Center  12/11/2015 8:45 AM Cheral MarkerKimberly R Booker, CNM [facility]  01/16/2016 10:30 AM [facility] None  01/16/2016 11:00 AM Artis DelayNi Gorsuch, MD [facility] None   Follow up Visit:No Follow-up on file.  Postpartum contraception: IUD Mirena  Newborn Data: Live born female  Birth Weight: 8 lb 9.4 oz (3895 g) APGAR: 9, 9  Baby  Feeding: Bottle Disposition:home with mother   12/11/2015 Loni MuseKate Timberlake, MD  CNM attestation I have seen and examined this patient and agree with above documentation in the resident's note.   Kimberly Etiennemity Gardella is a 25 y.o. [id number] s/p SVD.   Pain is well controlled.  Plan for birth control is IUD.  Method of Feeding: bottle  PE:  BP 97/57 mmHg  Pulse 71  Temp(Src) 97.6 F (36.4 C) (Oral)  Resp 18  Ht 5\' 4"  (1.626 m)  Wt 76.658 kg (169 lb)  BMI 28.99 kg/m2  SpO2 98%  Breastfeeding? Unknown Fundus firm   Recent Labs  12/09/15 1305 12/10/15 0540  HGB 11.0* 10.2*  HCT 32.6* 30.1*     Plan: discharge today - postpartum care discussed - f/u clinic in 6 weeks for postpartum visit   Cam HaiSHAW, KIMBERLY, CNM 9:13 AM 12/11/2015

## 2015-12-11 NOTE — Discharge Instructions (Signed)
Please resume your over the counter Iron pill (since the one we normally prescribe has red dye in it). Also resume your prenatal vitamin (the one we normally prescribe also has red dye in it). You may also use colace or over the counter stool softeners that do not contain red dye.   Vaginal Delivery, Care After Refer to this sheet in the next few weeks. These discharge instructions provide you with information on caring for yourself after delivery. Your health care provider may also give you specific instructions. Your treatment has been planned according to the most current medical practices available, but problems sometimes occur. Call your health care provider if you have any problems or questions after you go home. HOME CARE INSTRUCTIONS  Take over-the-counter or prescription medicines only as directed by your health care provider or pharmacist.  Do not drink alcohol, especially if you are breastfeeding or taking medicine to relieve pain.  Do not chew or smoke tobacco.  Do not use illegal drugs.  Continue to use good perineal care. Good perineal care includes:  Wiping your perineum from front to back.  Keeping your perineum clean.  Do not use tampons or douche until your health care provider says it is okay.  Shower, wash your hair, and take tub baths as directed by your health care provider.  Wear a well-fitting bra that provides breast support.  Eat healthy foods.  Drink enough fluids to keep your urine clear or pale yellow.  Eat high-fiber foods such as whole grain cereals and breads, brown rice, beans, and fresh fruits and vegetables every day. These foods may help prevent or relieve constipation.  Follow your health care provider's recommendations regarding resumption of activities such as climbing stairs, driving, lifting, exercising, or traveling.  Talk to your health care provider about resuming sexual activities. Resumption of sexual activities is dependent upon your  risk of infection, your rate of healing, and your comfort and desire to resume sexual activity.  Try to have someone help you with your household activities and your newborn for at least a few days after you leave the hospital.  Rest as much as possible. Try to rest or take a nap when your newborn is sleeping.  Increase your activities gradually.  Keep all of your scheduled postpartum appointments. It is very important to keep your scheduled follow-up appointments. At these appointments, your health care provider will be checking to make sure that you are healing physically and emotionally. SEEK MEDICAL CARE IF:   You are passing large clots from your vagina. Save any clots to show your health care provider.  You have a foul smelling discharge from your vagina.  You have trouble urinating.  You are urinating frequently.  You have pain when you urinate.  You have a change in your bowel movements.  You have increasing redness, pain, or swelling near your vaginal incision (episiotomy) or vaginal tear.  You have pus draining from your episiotomy or vaginal tear.  Your episiotomy or vaginal tear is separating.  You have painful, hard, or reddened breasts.  You have a severe headache.  You have blurred vision or see spots.  You feel sad or depressed.  You have thoughts of hurting yourself or your newborn.  You have questions about your care, the care of your newborn, or medicines.  You are dizzy or light-headed.  You have a rash.  You have nausea or vomiting.  You were breastfeeding and have not had a menstrual period within 12 weeks  after you stopped breastfeeding.  You are not breastfeeding and have not had a menstrual period by the 12th week after delivery.  You have a fever. SEEK IMMEDIATE MEDICAL CARE IF:   You have persistent pain.  You have chest pain.  You have shortness of breath.  You faint.  You have leg pain.  You have stomach pain.  Your  vaginal bleeding saturates two or more sanitary pads in 1 hour.   This information is not intended to replace advice given to you by your health care provider. Make sure you discuss any questions you have with your health care provider.   Document Released: 05/21/2000 Document Revised: 02/12/2015 Document Reviewed: 01/19/2012 Elsevier Interactive Patient Education [employer]2016 Elsevier Inc.

## 2015-12-31 ENCOUNTER — Inpatient Hospital Stay (HOSPITAL_COMMUNITY)
Admission: AD | Admit: 2015-12-31 | Discharge: 2015-12-31 | Discharge status: Absent without leave | Payer: BLUE CROSS/BLUE SHIELD | Attending: Obstetrics and Gynecology | Admitting: Obstetrics and Gynecology

## 2016-01-14 ENCOUNTER — Ambulatory Visit (INDEPENDENT_AMBULATORY_CARE_PROVIDER_SITE_OTHER): Payer: BLUE CROSS/BLUE SHIELD | Admitting: Advanced Practice Midwife

## 2016-01-14 ENCOUNTER — Encounter: Payer: Self-pay | Admitting: Advanced Practice Midwife

## 2016-01-14 MED ORDER — LIDOCAINE 5 % EX OINT: 1.0000 "application " | TOPICAL_OINTMENT | CUTANEOUS | 0 refills | Status: DC | PRN

## 2016-01-14 NOTE — Progress Notes (Signed)
Kimberly Fields is a 25 y.o. who presents for a postpartum visit. She is 5 weeks postpartum following a spontaneous vaginal delivery. I have fully reviewed the prenatal and intrapartum course. The delivery was at 40.2 gestational weeks.  Anesthesia: epidural. Postpartum course has been uncomplicated. Baby's course has been uneventful. Baby is feeding by bottle. Bleeding: staining only. Bowel function is normal. Bladder function is normal. Patient is not sexually active. Contraception method is none. Postpartum depression screening: negative. She does feel like she gets angry a lot.  Declines therapy referral right now.    Current Outpatient Prescriptions:  .  acetaminophen (TYLENOL) 325 MG tablet, Take 650 mg by mouth every 6 (six) hours as needed for mild pain. , Disp: , Rfl:  .  albuterol (PROVENTIL HFA;VENTOLIN HFA) 108 (90 Base) MCG/ACT inhaler, Inhale 2 puffs into the lungs every 4 (four) hours as needed for wheezing or shortness of breath., Disp: 1 Inhaler, Rfl: 0 .  ibuprofen (ADVIL,MOTRIN) 600 MG tablet, Take 1 tablet (600 mg total) by mouth every 6 (six) hours as needed for mild pain or moderate pain., Disp: 30 tablet, Rfl: 0  Review of Systems   Constitutional: Negative for fever and chills Eyes: Negative for visual disturbances Respiratory: Negative for shortness of breath, dyspnea Cardiovascular: Negative for chest pain or palpitations  Gastrointestinal: Negative for vomiting, diarrhea and constipation Genitourinary: Negative for dysuria and urgency.  Has pain with BMs at anus, stools soft. Had a fissure for 3 months w/first baby.  Musculoskeletal: Negative for back pain, joint pain, myalgias  Neurological: Negative for dizziness and headaches   Objective:    There were no vitals filed for this visit. General:  alert, cooperative and no distress   Breasts:  negative  Lungs: clear to auscultation bilaterally  Heart:  regular rate and rhythm  Abdomen: Soft, nontender   Vulva:   normal  Vagina: normal vagina  Cervix:  closed  Corpus: Well involuted     Rectal Exam: no hemorrhoids.  No visible fissure, but there is some erythema in an area where old fissure used to be.        Assessment:    normal postpartum exam.  Plan:    1. Contraception: IUD 2. Follow up in: 2 weeks for IUD:  No sex until then. If still having anger issues, recommended she let us give her a therapy referral.  3.  Lidocaine 5% jelly prn BM

## 2016-01-16 ENCOUNTER — Ambulatory Visit: Payer: BLUE CROSS/BLUE SHIELD | Admitting: Hematology and Oncology

## 2016-01-16 ENCOUNTER — Encounter: Payer: Self-pay | Admitting: Hematology and Oncology

## 2016-01-16 ENCOUNTER — Other Ambulatory Visit: Payer: BLUE CROSS/BLUE SHIELD

## 2016-01-28 ENCOUNTER — Ambulatory Visit: Payer: BLUE CROSS/BLUE SHIELD | Admitting: Advanced Practice Midwife

## 2016-02-02 ENCOUNTER — Telehealth: Payer: Self-pay | Admitting: *Deleted

## 2016-02-02 NOTE — Telephone Encounter (Signed)
Pt states she is scheduled to get the IUD tomorrow and is still bleeding from her periods, is this ok. Pt informed that she can get the IUD while on her period, actually is easier for provider to insert. Pt verbalized understanding.

## 2016-02-03 ENCOUNTER — Encounter: Payer: Self-pay | Admitting: Advanced Practice Midwife

## 2016-02-03 ENCOUNTER — Ambulatory Visit (INDEPENDENT_AMBULATORY_CARE_PROVIDER_SITE_OTHER): Payer: Medicaid Other | Admitting: Advanced Practice Midwife

## 2016-02-03 VITALS — BP 106/70 | HR 70 | Ht 63.0 in | Wt 146.0 lb

## 2016-02-03 DIAGNOSIS — K625 Hemorrhage of anus and rectum: Secondary | ICD-10-CM

## 2016-02-03 DIAGNOSIS — Z3202 Encounter for pregnancy test, result negative: Secondary | ICD-10-CM

## 2016-02-03 DIAGNOSIS — Z30430 Encounter for insertion of intrauterine contraceptive device: Secondary | ICD-10-CM

## 2016-02-03 DIAGNOSIS — Z30014 Encounter for initial prescription of intrauterine contraceptive device: Secondary | ICD-10-CM | POA: Diagnosis not present

## 2016-02-03 HISTORY — PX: OTHER SURGICAL HISTORY: SHX169

## 2016-02-03 LAB — POCT URINE PREGNANCY: Preg Test, Ur: NEGATIVE

## 2016-02-03 MED ORDER — HYDROCORTISONE ACE-PRAMOXINE 1-1 % RE FOAM: 1.0000 | Freq: Two times a day (BID) | RECTAL | 3 refills | Status: DC

## 2016-02-03 NOTE — Progress Notes (Signed)
Lorenza Anitra Lauthlunkett is a 25 y.o. year old  female   who presents for placement of a Mirena IUD. She has not had sex since delivery and her pregnancy test today is negative.    The risks and benefits of the method and placement have been thouroughly reviewed with the patient and all questions were answered.  Specifically the patient is aware of failure rate of 06/998, expulsion of the IUD and of possible perforation.  The patient is aware of irregular bleeding due to the method and understands the incidence of irregular bleeding diminishes with time.  Time out was performed.  A Graves speculum was placed.  The cervix was prepped using Betadine. The uterus was found to be neutral and it sounded to 8 cm.  The cervix was grasped with a tenaculum and the IUD was inserted to 8 cm.  It was pulled back 1 cm and the IUD was disengaged.  The strings were trimmed to 3 cm.  Sonogram was performed and the proper placement of the IUD was verified.  The patient was instructed on signs and symptoms of infection and to check for the strings after each menses or each month.  The patient is to refrain from intercourse for 3 days.  The patient is scheduled for a return appointment after her first menses or 4 weeks.   Still having rectal pain w/BMs, now says "toilett is filled with blood."  At first, she likened it to an anal fissure she had after first delivery.  However, area on rectum is now healed, plus, all the blood points more towards internal hemorrhoids.  Will refer to GI, start proctofoam BID in the meantime.  Also, still having some anger/anxiety. Ready to start therapy.  Lives in [place], will seek tx there.   CRESENZO-DISHMAN,Hildegard Hlavac 02/03/2016 4:03 PM in

## 2016-02-03 NOTE — Patient Instructions (Addendum)
[facility] [address] [place], [place] [address]         Levonorgestrel intrauterine device (IUD) What is this medicine? LEVONORGESTREL IUD (LEE voe nor jes trel) is a contraceptive (birth control) device. The device is placed inside the uterus by a healthcare professional. It is used to prevent pregnancy and can also be used to treat heavy bleeding that occurs during your period. Depending on the device, it can be used for 3 to 5 years. This medicine may be used for other purposes; ask your health care provider or pharmacist if you have questions. What should I tell my health care provider before I take this medicine? They need to know if you have any of these conditions: -abnormal Pap smear -cancer of the breast, uterus, or cervix -diabetes -endometritis -genital or pelvic infection now or in the past -have more than one sexual partner or your partner has more than one partner -heart disease -history of an ectopic or tubal pregnancy -immune system problems -IUD in place -liver disease or tumor -problems with blood clots or take blood-thinners -use intravenous drugs -uterus of unusual shape -vaginal bleeding that has not been explained -an unusual or allergic reaction to levonorgestrel, other hormones, silicone, or polyethylene, medicines, foods, dyes, or preservatives -pregnant or trying to get pregnant -breast-feeding How should I use this medicine? This device is placed inside the uterus by a health care professional. Talk to your pediatrician regarding the use of this medicine in children. Special care may be needed. Overdosage: If you think you have taken too much of this medicine contact a poison control center or emergency room at once. NOTE: This medicine is only for you. Do not share this medicine with others. What if I miss a dose? This does not apply. What may interact with this medicine? Do not take  this medicine with any of the following medications: -amprenavir -bosentan -fosamprenavir This medicine may also interact with the following medications: -aprepitant -barbiturate medicines for inducing sleep or treating seizures -bexarotene -griseofulvin -medicines to treat seizures like carbamazepine, ethotoin, felbamate, oxcarbazepine, phenytoin, topiramate -modafinil -pioglitazone -rifabutin -rifampin -rifapentine -some medicines to treat HIV infection like atazanavir, indinavir, lopinavir, nelfinavir, tipranavir, ritonavir -St. John's wort -warfarin This list may not describe all possible interactions. Give your health care provider a list of all the medicines, herbs, non-prescription drugs, or dietary supplements you use. Also tell them if you smoke, drink alcohol, or use illegal drugs. Some items may interact with your medicine. What should I watch for while using this medicine? Visit your doctor or health care professional for regular check ups. See your doctor if you or your partner has sexual contact with others, becomes HIV positive, or gets a sexual transmitted disease. This product does not protect you against HIV infection (AIDS) or other sexually transmitted diseases. You can check the placement of the IUD yourself by reaching up to the top of your vagina with clean fingers to feel the threads. Do not pull on the threads. It is a good habit to check placement after each menstrual period. Call your doctor right away if you feel more of the IUD than just the threads or if you cannot feel the threads at all. The IUD may come out by itself. You may become pregnant if the device comes out. If you notice that the IUD has come out use a backup birth control method like condoms and call your health care provider. Using tampons will not  change the position of the IUD and are okay to use during your period. What side effects may I notice from receiving this medicine? Side effects that  you should report to your doctor or health care professional as soon as possible: -allergic reactions like skin rash, itching or hives, swelling of the face, lips, or tongue -fever, flu-like symptoms -genital sores -high blood pressure -no menstrual period for 6 weeks during use -pain, swelling, warmth in the leg -pelvic pain or tenderness -severe or sudden headache -signs of pregnancy -stomach cramping -sudden shortness of breath -trouble with balance, talking, or walking -unusual vaginal bleeding, discharge -yellowing of the eyes or skin Side effects that usually do not require medical attention (report to your doctor or health care professional if they continue or are bothersome): -acne -breast pain -change in sex drive or performance -changes in weight -cramping, dizziness, or faintness while the device is being inserted -headache -irregular menstrual bleeding within first 3 to 6 months of use -nausea This list may not describe all possible side effects. Call your doctor for medical advice about side effects. You may report side effects to FDA at 1-800-FDA-1088. Where should I keep my medicine? This does not apply. NOTE: This sheet is a summary. It may not cover all possible information. If you have questions about this medicine, talk to your doctor, pharmacist, or health care provider.    2016, Elsevier/Gold Standard. (2011-06-24 13:54:04)

## 2016-02-03 NOTE — Assessment & Plan Note (Signed)
02/03/16 mirena

## 2016-02-04 ENCOUNTER — Telehealth: Payer: Self-pay | Admitting: Gastroenterology

## 2016-02-04 ENCOUNTER — Telehealth: Payer: Self-pay | Admitting: Advanced Practice Midwife

## 2016-02-04 NOTE — Telephone Encounter (Signed)
Pt had a baby and a month after delivery she began to have BRB with bowel movement.  Rectal pain with bowel movements.  Has a BM every 3 days, they are soft and she is taking stool softener since the birth of the baby. OB/GYN gave her proctofoam to try and to follow up with GI.  She will start proctofoam today and appt with Leone PayorGessner for 02/10/16

## 2016-02-04 NOTE — Telephone Encounter (Signed)
Pt states she had an IUD placed by Drenda FreezeFran yesterday and every since then she has had a headache and nausea, informed pt that those are not symptoms typically related to the IUD.  Pt verbalized understanding.

## 2016-02-10 ENCOUNTER — Ambulatory Visit (INDEPENDENT_AMBULATORY_CARE_PROVIDER_SITE_OTHER): Payer: BLUE CROSS/BLUE SHIELD | Admitting: Internal Medicine

## 2016-02-10 ENCOUNTER — Encounter: Payer: Self-pay | Admitting: Internal Medicine

## 2016-02-10 VITALS — BP 90/54 | HR 68 | Ht 64.0 in | Wt 144.8 lb

## 2016-02-10 DIAGNOSIS — K602 Anal fissure, unspecified: Secondary | ICD-10-CM | POA: Diagnosis not present

## 2016-02-10 DIAGNOSIS — K5909 Other constipation: Secondary | ICD-10-CM

## 2016-02-10 DIAGNOSIS — K648 Other hemorrhoids: Secondary | ICD-10-CM

## 2016-02-10 MED ORDER — DILTIAZEM GEL 2 %: 1.0000 "application " | Freq: Three times a day (TID) | CUTANEOUS | 3 refills | Status: DC

## 2016-02-10 MED ORDER — HYDROCORTISONE ACE-PRAMOXINE 1-1 % RE FOAM: 1.0000 | Freq: Two times a day (BID) | RECTAL | 3 refills | Status: DC

## 2016-02-10 NOTE — Patient Instructions (Addendum)
   Today we are giving you handouts on Hemorrhoids, and anal fissure to read.    Today we are giving you a handout on benefiber to read and follow.     We have sent the following medications to your pharmacy for you to pick up at your convenience: Diltiazem gel, [facility] , try to use at least twice daily and after bowel movements when possible.    You may use your proctofoam as well per Dr Leone PayorGessner, I sent in a refill.    We have made you an appointment for October 6th at 3:00pm, you may cancel if better.     I appreciate the opportunity to care for you. Stan Headarl Gessner, MD, [facility]

## 2016-02-10 NOTE — Progress Notes (Signed)
referred by: Jacklyn ShellFrances Cresenzo-Dishmon, CNM [address] [place], [place] [id number]  Assessment & Plan:   Encounter Diagnoses  Name Primary?  Marland Kitchen. Anal fissure - posterior Yes  . Bleeding internal hemorrhoids   . Other constipation    Benefiber 2 tbsp Diltiazem gel 2% bid-tid May need Miralax OK to use Proctofoam prn RTC 2-3 mos Anal fissure and hemorrhoid handout Subjective:    Patient ID: Kimberly Etiennemity Fields, female    DOB: 10/22/1990, 25 y.o.   MRN: [id number] Cc: rectal bleeding and pain HPI 25 yo ww several weeks post - partum with bright red rectal bleeding and sharp anal pain with and after defecation. Also constipated - BM q 3-5 days. Similar sxs after 1st childbirth last year but these are more severe - had spontaneous remission then. Has been Rxed proctofoam and bleeding better - but persists.  She is worried because a grandmother had colon cancer   She is bottle-feeding. Medications, allergies, past medical history, past surgical history, family history and social history are reviewed and updated in the EMR.  Review of Systems As abive, mild abd discomfort afte IUD all other ROS neg    Objective:   Physical Exam @BP  (!) 90/54   Pulse 68   Ht 5\' 4"  (1.626 m)   Wt 144 lb 12.8 oz (65.7 kg)   LMP 01/30/2016   BMI 24.85 kg/m @  General:  Well-developed, well-nourished and in no acute distress Eyes:  anicteric. Lungs: Clear to auscultation bilaterally. Heart:  S1S2, no rubs, murmurs, gallops. Abdomen:  soft, non-tender, no hepatosplenomegaly, hernia, or mass and BS+. + striae Rectal:  Patti SwazilandJordan, CMA present.  Anoderm inspection revealed small posterior sentinel pile Digital exam revealed mildly increased resting tone and was tender .   Anoscopy was performed with the patient in the left lateral decubitus position while a chaperone was present and revealed posterior andl fissure, Gr 2 RP and Gr 1 RA/LL internal hemorrhoids   Lymph:  no cervical or  supraclavicular adenopathy. Extremities:   no edema, cyanosis or clubbing Skin   no rash. Neuro:  A&O x 3.  Psych:  appropriate mood and  Affect.   Data Reviewed:  Lab Results  Component Value Date   WBC 14.1 (H) 12/10/2015   HGB 10.2 (L) 12/10/2015   HCT 30.1 (L) 12/10/2015   MCV 85.0 12/10/2015   PLT 168 12/10/2015

## 2016-02-11 ENCOUNTER — Encounter: Payer: Self-pay | Admitting: Internal Medicine

## 2016-02-11 DIAGNOSIS — K648 Other hemorrhoids: Secondary | ICD-10-CM | POA: Insufficient documentation

## 2016-02-25 ENCOUNTER — Telehealth: Payer: Self-pay | Admitting: Advanced Practice Midwife

## 2016-02-25 NOTE — Telephone Encounter (Signed)
Pt states she had an IUD placed about 3 weeks ago and for the past week and a half she has been having brown bleeding and it is getting darker brown and heavier and having some mild cramping.  Pt has an appointment scheduled for a follow up with Drenda FreezeFran on next Wednesday.  Pt advised this can be normal after having IUD placed and advised to keep appointment next week.  Pt verbalized understanding.

## 2016-03-03 ENCOUNTER — Ambulatory Visit: Payer: BLUE CROSS/BLUE SHIELD | Admitting: Advanced Practice Midwife

## 2016-03-11 ENCOUNTER — Ambulatory Visit: Payer: BLUE CROSS/BLUE SHIELD | Admitting: Advanced Practice Midwife

## 2016-03-12 ENCOUNTER — Ambulatory Visit: Payer: BLUE CROSS/BLUE SHIELD | Admitting: Internal Medicine

## 2016-03-30 ENCOUNTER — Ambulatory Visit: Payer: Medicaid Other | Admitting: Advanced Practice Midwife

## 2016-03-30 ENCOUNTER — Telehealth: Payer: Self-pay | Admitting: Women's Health

## 2016-03-30 ENCOUNTER — Ambulatory Visit: Payer: BLUE CROSS/BLUE SHIELD | Admitting: Advanced Practice Midwife

## 2016-03-30 ENCOUNTER — Telehealth: Payer: Self-pay | Admitting: Advanced Practice Midwife

## 2016-03-30 NOTE — Telephone Encounter (Signed)
Pt was not able to be seen today because her medicaid has changed into Family planning and Pt needs a pap and physical before Medicaid will pay for visit. Pt has rescheduled her appointment for another day for pap and physical. Pt would like to know if Drenda FreezeFran could call her in a medication that would stop the bleeding. Please contact pt

## 2016-03-31 ENCOUNTER — Encounter: Payer: Self-pay | Admitting: Advanced Practice Midwife

## 2016-03-31 ENCOUNTER — Other Ambulatory Visit: Payer: Self-pay | Admitting: Advanced Practice Midwife

## 2016-03-31 MED ORDER — MEGESTROL ACETATE 40 MG PO TABS: ORAL_TABLET | ORAL | 3 refills | Status: DC

## 2016-03-31 NOTE — Progress Notes (Signed)
Megace for DUB/IUD

## 2016-03-31 NOTE — Telephone Encounter (Signed)
Patient called stating she had IUD placed 2 months ago and is still having heavy bleeding at times. She was not able to be seen yesterday because her medicaid has changed into Family planning and needs a pap and physical before Medicaid will pay for visit.She has rescheduled her appointment for 11/8  for pap and physical but would like to know if you could call her in a medication that would stop the bleeding.  Please advise. Patient does have an active mychart account and can be contacted through that.

## 2016-04-08 IMAGING — US US OB LIMITED
1 series · 14 of 28 positions shown · non-contrast
Comparison: none

CLINICAL DATA: Abdominal pain and cough. Estimated gestational age
by LMP is 15 weeks 5 days. Quantitative beta HCG is [DATE].

EXAM:
LIMITED OBSTETRIC ULTRASOUND

[Series 1: us ob limited · 0.23mm/px · 14 of 38 slices shown]
[im 2/38]
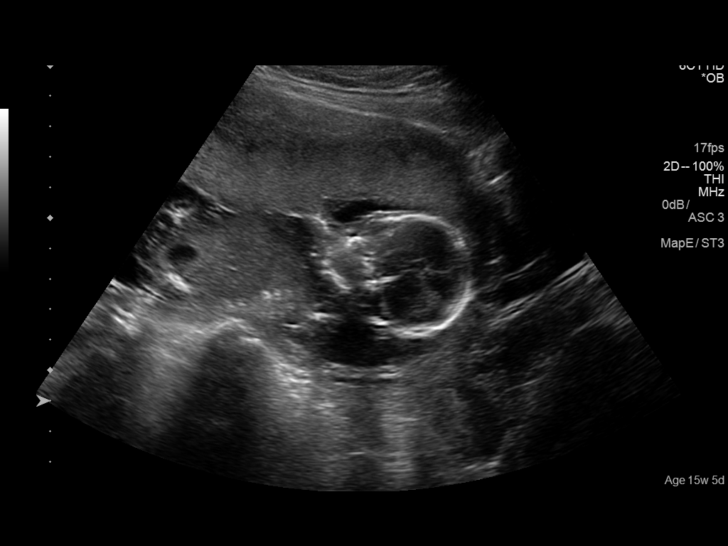
[im 5/38]
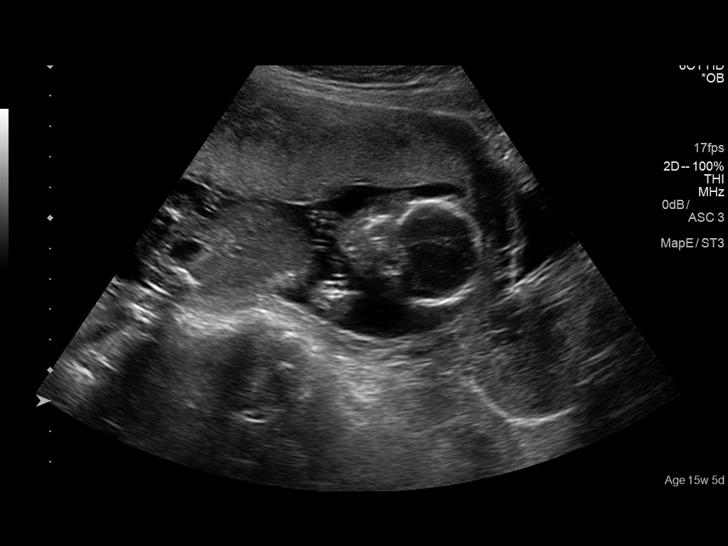
[im 7/38]
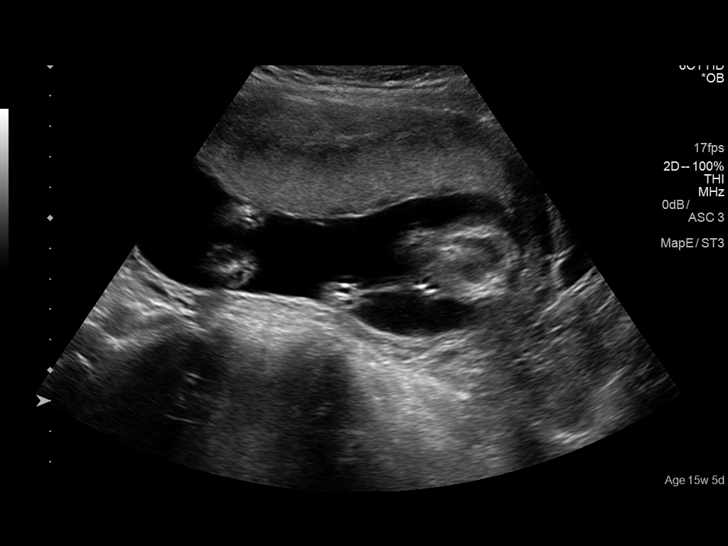
[im 10/38]
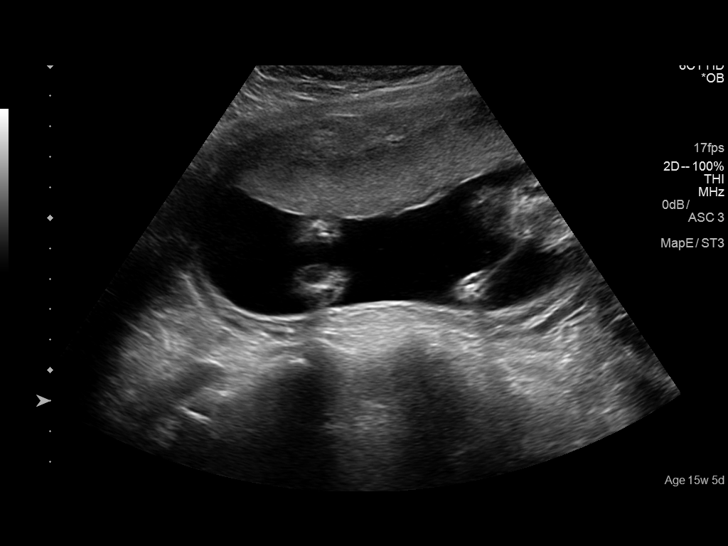
[im 13/38]
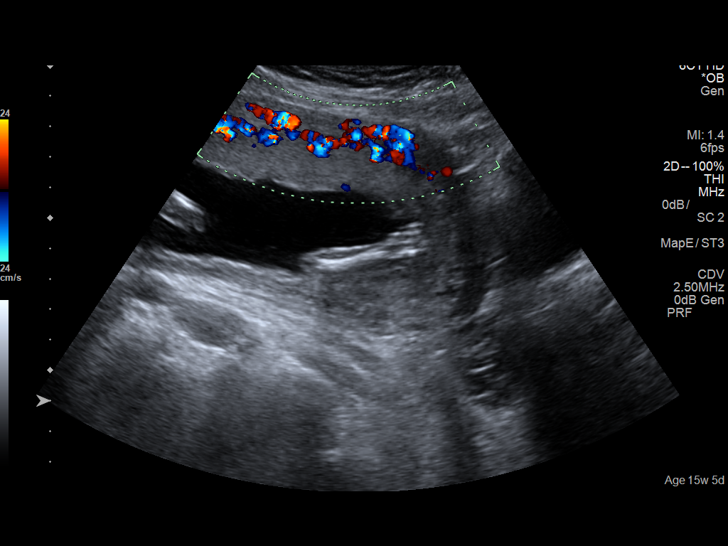
[im 16/38]
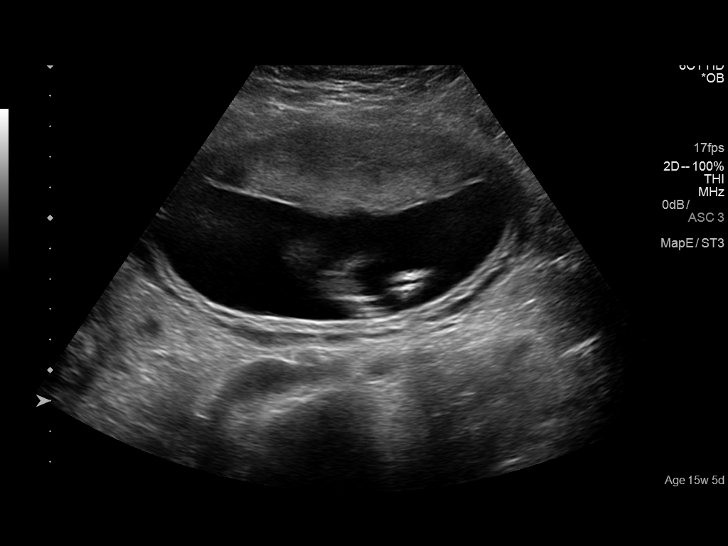
[im 18/38]
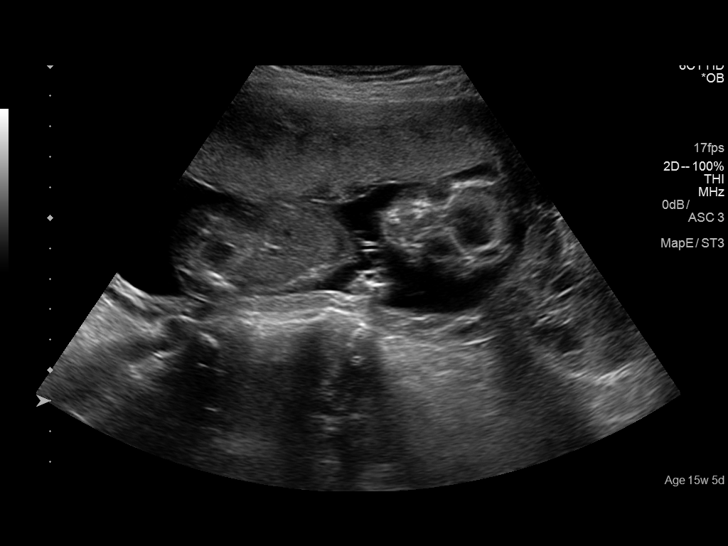
[im 21/38]
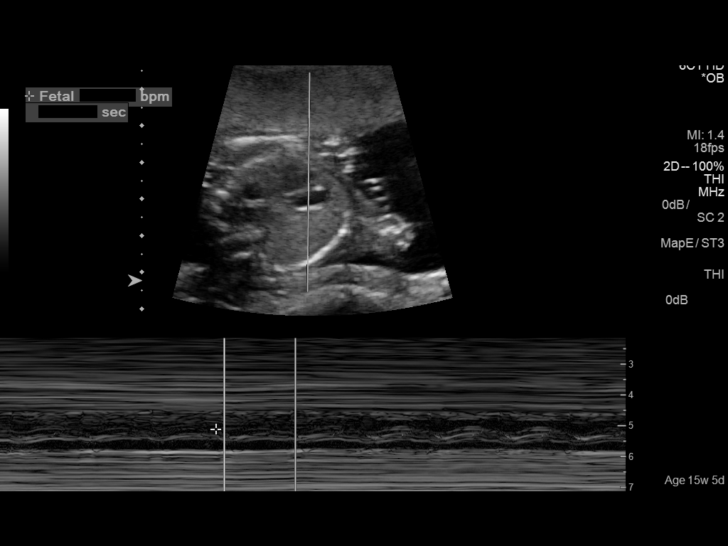
[im 24/38]
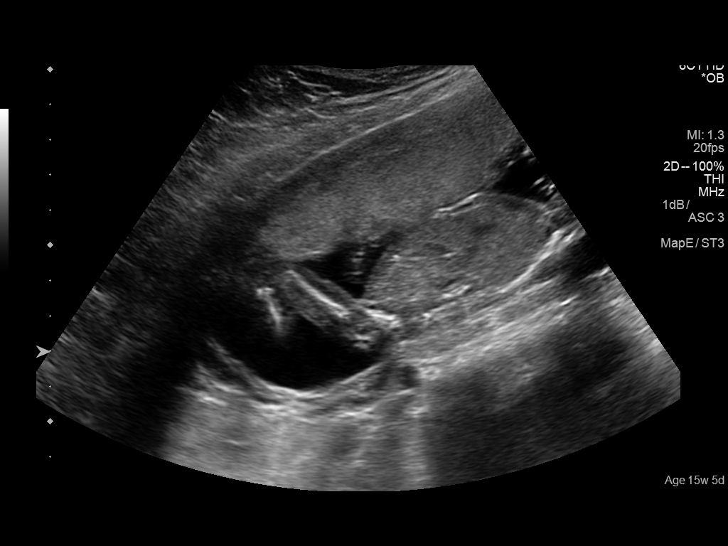
[im 27/38]
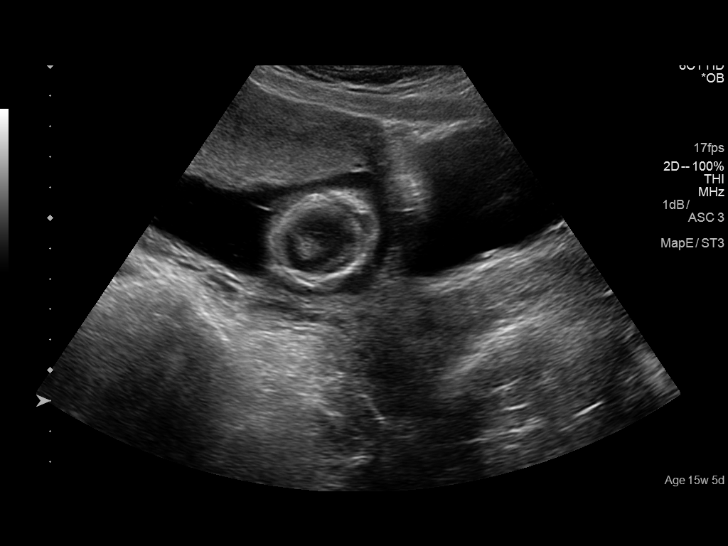
[im 29/38]
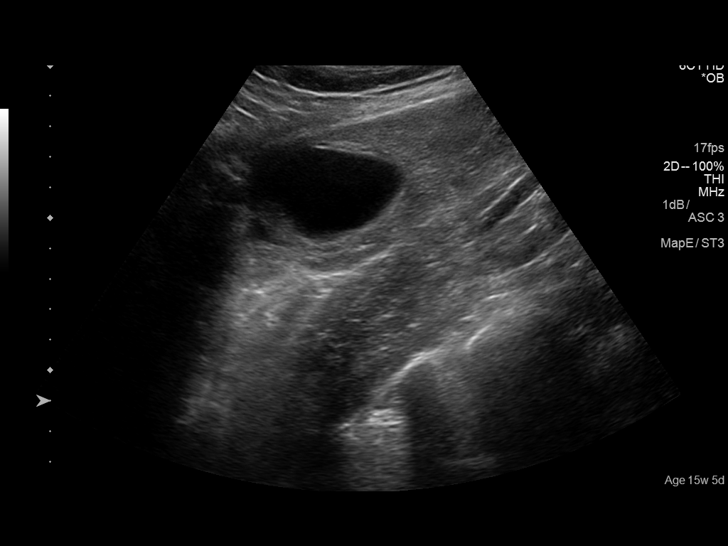
[im 32/38]
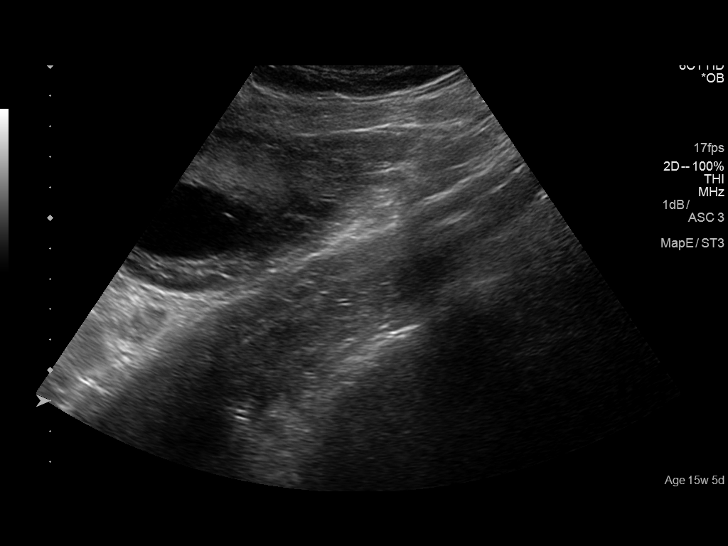
[im 35/38]
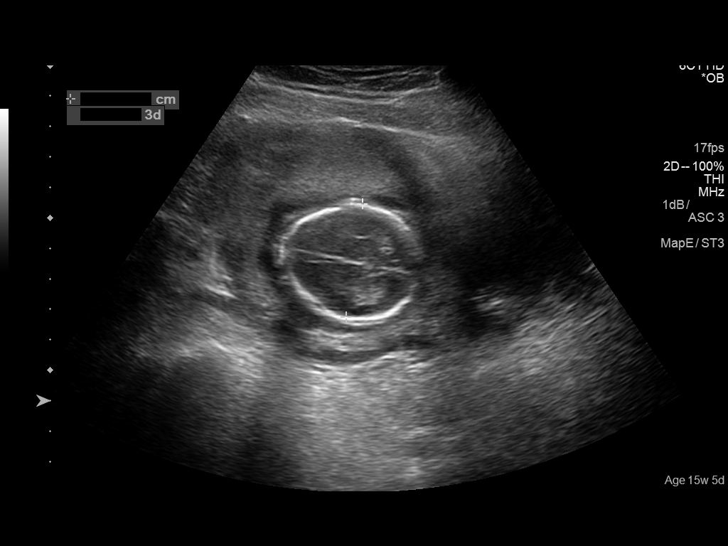
[im 38/38]
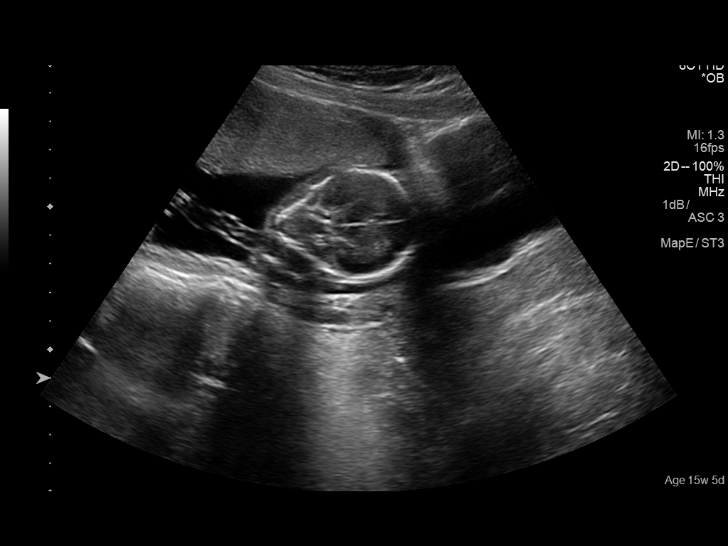

[14 of 28 positions shown; findings below may reference images not displayed]

FINDINGS: Number of Fetuses: 1

Heart Rate:  144 bpm

Movement: Yes

Presentation: Cephalic presentation.

Placental Location: Anterior

Previa: No

Amniotic Fluid (Subjective):  Within normal limits.

BPD:  3.6cm 17w  1d

MATERNAL FINDINGS:

Cervix:  Appears closed.

Uterus/Adnexae:  Limited visualization.  No mass identified.
IMPRESSION: Single intrauterine pregnancy in cephalic presentation. No gross
evidence of any acute complication is demonstrated.

This exam is performed on an emergent basis and does not
comprehensively evaluate fetal size, dating, or anatomy; follow-up
complete OB US should be considered if further fetal assessment is
warranted.

## 2016-04-14 ENCOUNTER — Encounter: Payer: Self-pay | Admitting: Advanced Practice Midwife

## 2016-04-14 ENCOUNTER — Ambulatory Visit (INDEPENDENT_AMBULATORY_CARE_PROVIDER_SITE_OTHER): Payer: Medicaid Other | Admitting: Advanced Practice Midwife

## 2016-04-14 ENCOUNTER — Other Ambulatory Visit (HOSPITAL_COMMUNITY)
Admission: RE | Admit: 2016-04-14 | Discharge: 2016-04-14 | Disposition: A | Discharge status: Home / Self Care | Payer: Self-pay | Source: Ambulatory Visit | Attending: Advanced Practice Midwife | Admitting: Advanced Practice Midwife

## 2016-04-14 VITALS — BP 88/52 | HR 80 | Ht 63.0 in | Wt 147.0 lb

## 2016-04-14 DIAGNOSIS — Z308 Encounter for other contraceptive management: Secondary | ICD-10-CM | POA: Diagnosis not present

## 2016-04-14 DIAGNOSIS — Z01419 Encounter for gynecological examination (general) (routine) without abnormal findings: Secondary | ICD-10-CM | POA: Insufficient documentation

## 2016-04-14 DIAGNOSIS — Z113 Encounter for screening for infections with a predominantly sexual mode of transmission: Secondary | ICD-10-CM | POA: Insufficient documentation

## 2016-04-14 DIAGNOSIS — R3915 Urgency of urination: Secondary | ICD-10-CM

## 2016-04-14 DIAGNOSIS — D509 Iron deficiency anemia, unspecified: Secondary | ICD-10-CM

## 2016-04-14 DIAGNOSIS — Z124 Encounter for screening for malignant neoplasm of cervix: Secondary | ICD-10-CM

## 2016-04-14 LAB — POCT URINALYSIS DIPSTICK
Blood, UA: NEGATIVE
Glucose, UA: NEGATIVE
KETONES UA: NEGATIVE
NITRITE UA: NEGATIVE
PROTEIN UA: NEGATIVE

## 2016-04-14 MED ORDER — MEGESTROL ACETATE 20 MG PO TABS: ORAL_TABLET | ORAL | 3 refills | Status: DC

## 2016-04-14 MED ORDER — OXYBUTYNIN CHLORIDE 5 MG PO TABS: 5.0000 mg | ORAL_TABLET | Freq: Three times a day (TID) | ORAL | 3 refills | Status: DC

## 2016-04-14 NOTE — Progress Notes (Signed)
Kimberly Fields 25 y.o.  Vitals:   04/14/16 0847  BP: (!) 88/52  Pulse: 80     Filed Weights   04/14/16 0847  Weight: 147 lb (66.7 kg)    Past Medical History: Past Medical History:  Diagnosis Date  . Anal fissure   . Anemia   . Asthma   . UTI (lower urinary tract infection)     Past Surgical History: Past Surgical History:  Procedure Laterality Date  . iud placement  02/03/2016  . TONSILLECTOMY      Family History: Family History  Problem Relation Age of Onset  . Migraines Mother   . Asthma Mother   . Cervical cancer Mother   . COPD Maternal Grandmother   . Anxiety disorder Maternal Grandmother   . Coronary artery disease Maternal Grandmother   . Colon cancer Maternal Grandmother   . Coronary artery disease Maternal Grandfather   . Stroke Maternal Grandfather   . Tuberculosis Maternal Grandfather   . Skin cancer Maternal Grandfather   . Bipolar disorder Brother     Social History: Social History  Substance Use Topics  . Smoking status: Never Smoker  . Smokeless tobacco: Never Used  . Alcohol use No    Allergies:  Allergies  Allergen Reactions  . Red Dye Swelling      Current Outpatient Prescriptions:  .  albuterol (PROVENTIL HFA;VENTOLIN HFA) 108 (90 Base) MCG/ACT inhaler, Inhale 2 puffs into the lungs every 4 (four) hours as needed for wheezing or shortness of breath., Disp: 1 Inhaler, Rfl: 0 .  diltiazem 2 % GEL, Apply 1 application topically 3 (three) times daily., Disp: 30 g, Rfl: 3 .  hydrocortisone-pramoxine (PROCTOFOAM HC) rectal foam, Place 1 applicator rectally 2 (two) times daily., Disp: 10 g, Rfl: 3 .  acetaminophen (TYLENOL) 325 MG tablet, Take 650 mg by mouth every 6 (six) hours as needed for mild pain. , Disp: , Rfl:  .  megestrol (MEGACE) 40 MG tablet, Take 3/day (at the same time) for 5 days; 2/day for 5 days, then 1/day PO prn bleeding (Patient not taking: Reported on 04/14/2016), Disp: 60 tablet, Rfl: 3  History of Present  Illness: here for pap. Saw GI MD for rectal fissure/hemorrhoids, LOVES med for fissure (compounded Diltiazem 2% gel).  Spots daily, annoyed. Has [place] medicaid, rx for #60 megace 40mg  was $60.  Will change to 20mg  at [facility] for PRN use.  Has some urinary urgency/incontinence (mixed) and frequency since childbrith.  Already doing some kegals .   Review of Systems   Patient denies any headaches, blurred vision, shortness of breath, chest pain, abdominal pain, problems with bowel movements, or intercourse.   Physical Exam: General:  Well developed, well nourished, no acute distress Skin:  Warm and dry Neck:  Midline trachea, normal thyroid Lungs; Clear to auscultation bilaterally Breast:  No dominant palpable mass, retraction, or nipple discharge Cardiovascular: Regular rate and rhythm Abdomen:  Soft, non tender, no hepatosplenomegaly Pelvic:  External genitalia is normal in appearance.  The vagina is normal in appearance.  The cervix is bulbous. IUD strings visible.  No blood now. Uterus is felt to be normal size, shape, and contour.  No adnexal masses or tenderness noted.  Extremities:  No swelling or varicosities noted Psych:  No mood changes.     Impression: normal GYN exam Urinary urgency/frequency/incontinence:  Rx ditropan/kegals.      Plan: if normal, repeat 3 years

## 2016-04-14 NOTE — Patient Instructions (Signed)
HPV (Human Papillomavirus) Vaccine--Gardasil-9:  1. Why get vaccinated? Gardasil-9 prevents human papillomavirus (HPV) types that cause many cancers, including:  cervical cancer in females,  vaginal and vulvar cancers in females,  anal cancer in females and males,  throat cancer in females and males, and  penile cancer in males. In addition, Gardasil-9 prevents HPV types that cause genital warts in both females and males. In the [place] about 12,000 women get cervical cancer every year, and about 4,000 women die from it. Gardasil-9 can prevent most of these cases of cervical cancer. Vaccination is not a substitute for cervical cancer screening. This vaccine does not protect against all HPV types that can cause cervical cancer. Women should still get regular Pap tests. HPV infection usually comes from sexual contact, and most people will become infected at some point in their life. About 14 million Americans, including teens, get infected every year. Most infections will go away and not cause serious problems. But thousands of women and men get cancer and diseases from HPV. 2. HPV vaccine Gardasil-9 is an FDA-approved HPV vaccine. It is recommended for both males and females. It is routinely given at 11 or 25 years of age, but it may be given beginning at age 9 years through age 26 years. Three doses of Gardasil-9 are recommended with the second dose given 1-2 months after the first dose and the third dose given 6 months after the first dose. 3. Some people should not get this vaccine  Anyone who has had a severe, life-threatening allergic reaction to a dose of HPV vaccine should not get another dose.  Anyone who has a severe (life threatening) allergy to any component of HPV vaccine should not get the vaccine. Tell your doctor if you have any severe allergies that you know of, including a severe allergy to yeast.  HPV vaccine is not recommended for pregnant women. If you learn that you were  pregnant when you were vaccinated, there is no reason to expect any problems for you or your baby. Any woman who learns she was pregnant when she got Gardasil-9 vaccine is encouraged to contact the manufacturer's registry for HPV vaccination during pregnancy at 1-800-986-8999. Women who are breastfeeding may be vaccinated.  If you have a mild illness, such as a cold, you can probably get the vaccine today. If you are moderately or severely ill, you should probably wait until you recover. Your doctor can advise you. 4. Risks of a vaccine reaction With any medicine, including vaccines, there is a chance of side effects. These are usually mild and go away on their own, but serious reactions are also possible. Most people who get HPV vaccine do not have any serious problems with it. Mild or moderate problems following Gardasil-9:  Reactions in the arm where the shot was given:  Soreness (about 9 people in 10)  Redness or swelling (about 1 person in 3)  Fever:  Mild (100F) (about 1 person in 10)  Moderate (102F) (about 1 person in 65)  Other problems:  Headache (about 1 person in 3) Problems that could happen after any injected vaccine:  People sometimes faint after a medical procedure, including vaccination. Sitting or lying down for about 15 minutes can help prevent fainting, and injuries caused by a fall. Tell your doctor if you feel dizzy, or have vision changes or ringing in the ears.  Some people get severe pain in the shoulder and have difficulty moving the arm where a shot was given. This happens   very rarely.  Any medication can cause a severe allergic reaction. Such reactions from a vaccine are very rare, estimated at about 1 in a million doses, and would happen within a few minutes to a few hours after the vaccination. As with any medicine, there is a very remote chance of a vaccine causing a serious injury or death. The safety of vaccines is always being monitored. For more  information, visit: www.cdc.gov/vaccinesafety/. 5. What if there is a serious reaction? What should I look for? Look for anything that concerns you, such as signs of a severe allergic reaction, very high fever, or unusual behavior. Signs of a severe allergic reaction can include hives, swelling of the face and throat, difficulty breathing, a fast heartbeat, dizziness, and weakness. These would usually start a few minutes to a few hours after the vaccination. What should I do? If you think it is a severe allergic reaction or other emergency that can't wait, call 9-1-1 or get to the nearest hospital. Otherwise, call your doctor. Afterward, the reaction should be reported to the "Vaccine Adverse Event Reporting System" (VAERS). Your doctor might file this report, or you can do it yourself through the VAERS web site at www.vaers.hhs.gov, or by calling 1-800-822-7967. VAERS does not give medical advice. 6. The National Vaccine Injury Compensation Program The National Vaccine Injury Compensation Program (VICP) is a federal program that was created to compensate people who may have been injured by certain vaccines. Persons who believe they may have been injured by a vaccine can learn about the program and about filing a claim by calling 1-800-338-2382 or visiting the VICP website at www.hrsa.gov/vaccinecompensation. There is a time limit to file a claim for compensation. 7. How can I learn more?  Ask your health care provider. He or she can give you the vaccine package insert or suggest other sources of information.  Call your local or state health department.  Contact the Centers for Disease Control and Prevention (CDC):  Call 1-800-232-4636 (1-800-CDC-INFO) or  Visit CDC's website at www.cdc.gov/hpv Vaccine Information Statement HPV Vaccine (Gardasil-9) 09/05/14   This information is not intended to replace advice given to you by your health care provider. Make sure you discuss any questions you  have with your health care provider.   Document Released: 12/19/2013 Document Revised: 10/08/2014 Document Reviewed: 12/19/2013 Elsevier Interactive Patient Education 2016 Elsevier Inc. 

## 2016-04-15 LAB — CYTOLOGY - PAP
Chlamydia: NEGATIVE
Diagnosis: NEGATIVE
Neisseria Gonorrhea: NEGATIVE

## 2016-04-16 ENCOUNTER — Encounter: Payer: Self-pay | Admitting: Advanced Practice Midwife

## 2016-04-19 ENCOUNTER — Telehealth: Payer: Self-pay | Admitting: *Deleted

## 2016-04-21 NOTE — Telephone Encounter (Signed)
Pt states the Rx for Megestrol and Oxybutynin not at pharmacy, both Rx are in EPIC. RX [facility] [place] per order in Epic from 04/14/2016. Pt aware.

## 2016-06-17 ENCOUNTER — Encounter (HOSPITAL_COMMUNITY): Payer: Self-pay

## 2016-06-17 ENCOUNTER — Emergency Department (HOSPITAL_COMMUNITY)
Admission: EM | Admit: 2016-06-17 | Discharge: 2016-06-18 | Disposition: A | Discharge status: Home / Self Care | Payer: Self-pay | Attending: Emergency Medicine | Admitting: Emergency Medicine

## 2016-06-17 DIAGNOSIS — J45909 Unspecified asthma, uncomplicated: Secondary | ICD-10-CM | POA: Insufficient documentation

## 2016-06-17 DIAGNOSIS — R51 Headache: Secondary | ICD-10-CM | POA: Insufficient documentation

## 2016-06-17 DIAGNOSIS — Z79899 Other long term (current) drug therapy: Secondary | ICD-10-CM | POA: Insufficient documentation

## 2016-06-17 DIAGNOSIS — R519 Headache, unspecified: Secondary | ICD-10-CM

## 2016-06-17 MED ORDER — IBUPROFEN 600 MG PO TABS: 600.0000 mg | ORAL_TABLET | Freq: Four times a day (QID) | ORAL | 0 refills | Status: DC | PRN

## 2016-06-17 MED ORDER — KETOROLAC TROMETHAMINE 15 MG/ML IJ SOLN
15.0000 mg | Freq: Once | INTRAMUSCULAR | Status: AC
  Administered 2016-06-17: 15 mg via INTRAVENOUS
  Filled 2016-06-17: qty 1

## 2016-06-17 MED ORDER — METOCLOPRAMIDE HCL 5 MG/ML IJ SOLN
10.0000 mg | Freq: Once | INTRAMUSCULAR | Status: AC
  Administered 2016-06-17: 10 mg via INTRAVENOUS
  Filled 2016-06-17: qty 2

## 2016-06-17 MED ORDER — DIPHENHYDRAMINE HCL 50 MG/ML IJ SOLN
25.0000 mg | Freq: Once | INTRAMUSCULAR | Status: DC
  Filled 2016-06-17: qty 1

## 2016-06-17 NOTE — Discharge Instructions (Signed)
We saw you in the ER for headaches. We are not sure what is causing your headaches, however, there appears to be no evidence of infection, bleeds or tumors based on our exam and results.  Please take motrin round the clock for the next 6 hours, and take other meds prescribed only for break through pain. See the neurologist if the pain becomes complex.

## 2016-06-17 NOTE — ED Triage Notes (Addendum)
Pt c/o migraine x 1 week w/ intermittent blurred vision/spots/loss of sight and bilateral ear pain x "a couple days."  Pain score 7/10.  Pt reports taking tylenol w/ some relief.  Pt reports migraine gets worse at night.  Sts "it'll get blurry, I'll see spots and then, everything goes black."  Sts episodes last "until I down."  Sts "the pain in my ears is sharp and it almost sounds like there is water in them."  Denies hitting head.

## 2016-06-17 NOTE — ED Provider Notes (Signed)
[facility]-EMERGENCY DEPT Provider Note   CSN: [id number] Arrival date & time: 06/17/16  1357     History   Chief Complaint Chief Complaint  Patient presents with  . Migraine  . Otalgia    HPI Kimberly Fields is a 26 y.o. female.  HPI  26 y/o white female presents to ED w/ new onset non-positional, constant, sharp, bilateral frontal-temporal headache partially relieved w/ Tylenol and bilateral ear pain x 1 wk. Pt states pain does not wake her up from sleep and not exacerbated w/ exertion. Also c/o nausea, photophobia, nausea, dizziness, visual floaters that occurred w/ HA onset but has resolved currently. Denies tinnitus, vomiting, syncope, LOC, head trauma, cough, SOB, CP, rhinorrhea, sinus pain, neck pain, fever, confusion. No prior hx of HA, seizures, CVA, head trauma. Mother has migraines that are controlled w/ ibuprofen. Hx of asthma well controlled on albuterol, anemia, IUD x 3 mths.     Past Medical History:  Diagnosis Date  . Anal fissure   . Anemia   . Asthma   . UTI (lower urinary tract infection)     Patient Active Problem List   Diagnosis Date Noted  . Bleeding internal hemorrhoids 02/11/2016  . Encounter for IUD insertion 02/03/2016  . Iron deficiency anemia 11/06/2015  . Anal fissure 11/12/2014    Past Surgical History:  Procedure Laterality Date  . iud placement  02/03/2016  . TONSILLECTOMY      OB History    Gravida Para Term Preterm AB Living   5 2 2  0 3 2   SAB TAB Ectopic Multiple Live Births   2 1 0 0 2       Home Medications    Prior to Admission medications   Medication Sig Start Date End Date Taking? Authorizing Provider  acetaminophen (TYLENOL) 325 MG tablet Take 650 mg by mouth every 6 (six) hours as needed for mild pain.    Yes Historical Provider, MD  albuterol (PROVENTIL HFA;VENTOLIN HFA) 108 (90 Base) MCG/ACT inhaler Inhale 2 puffs into the lungs every 4 (four) hours as needed for wheezing or shortness of breath. 07/02/15  Yes Tatyana  Kirichenko, PA-C  diltiazem 2 % GEL Apply 1 application topically 3 (three) times daily. Patient not taking: Reported on 06/17/2016 02/10/16   Iva Boop, MD  hydrocortisone-pramoxine (PROCTOFOAM [facility]) rectal foam Place 1 applicator rectally 2 (two) times daily. Patient not taking: Reported on 06/17/2016 02/10/16   Iva Boop, MD  ibuprofen (ADVIL,MOTRIN) 600 MG tablet Take 1 tablet (600 mg total) by mouth every 6 (six) hours as needed. 06/17/16   Derwood Kaplan, MD  megestrol (MEGACE) 20 MG tablet Take 3/day (at the same time) for 5 days; 2/day for 5 days, then 1/day PO prn bleeding Patient not taking: Reported on 06/17/2016 04/14/16   Jacklyn Shell, CNM  oxybutynin (DITROPAN) 5 MG tablet Take 1 tablet (5 mg total) by mouth 3 (three) times daily. Patient not taking: Reported on 06/17/2016 04/14/16   Jacklyn Shell, CNM    Family History Family History  Problem Relation Age of Onset  . Migraines Mother   . Asthma Mother   . Cervical cancer Mother   . COPD Maternal Grandmother   . Anxiety disorder Maternal Grandmother   . Coronary artery disease Maternal Grandmother   . Colon cancer Maternal Grandmother   . Coronary artery disease Maternal Grandfather   . Stroke Maternal Grandfather   . Tuberculosis Maternal Grandfather   . Skin cancer Maternal Grandfather   . Bipolar  disorder Brother     Social History Social History  Substance Use Topics  . Smoking status: Never Smoker  . Smokeless tobacco: Never Used  . Alcohol use No     Allergies   Red dye   Review of Systems Review of Systems  ROS 10 Systems reviewed and are negative for acute change except as noted in the HPI.      Physical Exam Updated Vital Signs BP 110/76 (BP Location: Left Arm)   Pulse 67   Temp 98.6 F (37 C) (Oral)   Resp 16   Wt 145 lb (65.8 kg)   LMP 06/16/2016   SpO2 96%   BMI 25.69 kg/m   Physical Exam  Constitutional: She is oriented to person, place, and time. She  appears well-developed.  HENT:  Head: Normocephalic and atraumatic.  Eyes: EOM are normal.  Neck: Normal range of motion. Neck supple.  Cardiovascular: Normal rate.   Pulmonary/Chest: Effort normal.  Abdominal: Bowel sounds are normal.  Neurological: She is alert and oriented to person, place, and time. No cranial nerve deficit. Coordination normal.  Cerebellar exam is normal (finger to nose) Sensory exam normal for bilateral upper and lower extremities - and patient is able to discriminate between sharp and dull. Motor exam is 4+/5   Skin: Skin is warm and dry.  Nursing note and vitals reviewed.    ED Treatments / Results  Labs (all labs ordered are listed, but only abnormal results are displayed) Labs Reviewed - No data to display  EKG  EKG Interpretation None       Radiology No results found.  Procedures Procedures (including critical care time)  Medications Ordered in ED Medications  diphenhydrAMINE (BENADRYL) injection 25 mg (25 mg Intravenous Refused 06/17/16 2244)  ketorolac (TORADOL) 15 MG/ML injection 15 mg (15 mg Intravenous Given 06/17/16 2241)  metoCLOPramide (REGLAN) injection 10 mg (10 mg Intravenous Given 06/17/16 2242)     Initial Impression / Assessment and Plan / ED Course  I have reviewed the triage vital signs and the nursing notes.  Pertinent labs & imaging results that were available during my care of the patient were reviewed by me and considered in my medical decision making (see chart for details).  Clinical Course as of Jun 17 2345  Thu Jun 17, 2016  2346 Patient reassessed. Pt is comfortable at this time.  Results of the workup discussed. Strict ER return precautions discussed. Follow up instruction discussed, and pt agrees with the plan and is comfortable with it.   [AN]    Clinical Course User Index [AN] Derwood Kaplan, MD   26 y/o white female presents to ED w/ new onset non-positional, constant, sharp, bilateral frontal-temporal  headache. No history of head trauma or FND makes epidural/subdural hematoma unlikely. No fever or neck stiffness makes meningitis unlikely. No cough, facial pain, rhinorrhea makes sinusitis unlikely. No FND makes CVA and SAH unlikely. No pulsatile temporal arteries or tenderness to palpation of temple makes temporal arteritis and giant cell arteritis unlikely.  No visual changes, eye pain, or amaurosis fugax makes acute angle closure glaucoma and central retinal artery occlusion unlikely. Lack of AMS and confusion makes masses and tumors unlikely.  CT of head is not necessary at this time due to low suspicion for [facility], CVA, tumors and no hx of head trauma. Plan: Toradol 30 mg IV for pain relief, IV fluids - 1 L NS for hydration, Benadryl 25 mg IV for relief of ear pain if allergies are contributory.  Once pain is controlled, pt can f/up w/ outpatient neurologist if needed. Pt can take OTC Ibuprofen 400 mg q 4-6 hrs prn for pain. D/c with Zofran 4 mg prn for nausea.   Final Clinical Impressions(s) / ED Diagnoses   Final diagnoses:  Bad headache    New Prescriptions New Prescriptions   IBUPROFEN (ADVIL,MOTRIN) 600 MG TABLET    Take 1 tablet (600 mg total) by mouth every 6 (six) hours as needed.     Derwood KaplanAnkit Jamiesha Victoria, MD 06/17/16 [phone]

## 2016-07-13 ENCOUNTER — Emergency Department (HOSPITAL_COMMUNITY)
Admission: EM | Admit: 2016-07-13 | Discharge: 2016-07-13 | Disposition: A | Discharge status: Home / Self Care | Payer: Self-pay | Attending: Emergency Medicine | Admitting: Emergency Medicine

## 2016-07-13 ENCOUNTER — Encounter (HOSPITAL_COMMUNITY): Payer: Self-pay | Admitting: Emergency Medicine

## 2016-07-13 ENCOUNTER — Emergency Department (HOSPITAL_COMMUNITY): Payer: Self-pay

## 2016-07-13 DIAGNOSIS — J45909 Unspecified asthma, uncomplicated: Secondary | ICD-10-CM | POA: Insufficient documentation

## 2016-07-13 DIAGNOSIS — R112 Nausea with vomiting, unspecified: Secondary | ICD-10-CM | POA: Insufficient documentation

## 2016-07-13 DIAGNOSIS — R1013 Epigastric pain: Secondary | ICD-10-CM | POA: Insufficient documentation

## 2016-07-13 DIAGNOSIS — R109 Unspecified abdominal pain: Secondary | ICD-10-CM

## 2016-07-13 LAB — COMPREHENSIVE METABOLIC PANEL
ALT: 11 U/L — ABNORMAL LOW (ref 14–54)
ANION GAP: 8 (ref 5–15)
AST: 15 U/L (ref 15–41)
Albumin: 4.4 g/dL (ref 3.5–5.0)
Alkaline Phosphatase: 55 U/L (ref 38–126)
BUN: 26 mg/dL — ABNORMAL HIGH (ref 6–20)
CHLORIDE: 103 mmol/L (ref 101–111)
CO2: 28 mmol/L (ref 22–32)
CREATININE: 0.76 mg/dL (ref 0.44–1.00)
Calcium: 9 mg/dL (ref 8.9–10.3)
Glucose, Bld: 109 mg/dL — ABNORMAL HIGH (ref 65–99)
Potassium: 3.6 mmol/L (ref 3.5–5.1)
SODIUM: 139 mmol/L (ref 135–145)
Total Bilirubin: 0.7 mg/dL (ref 0.3–1.2)
Total Protein: 7.3 g/dL (ref 6.5–8.1)

## 2016-07-13 LAB — URINALYSIS, ROUTINE W REFLEX MICROSCOPIC
Bilirubin Urine: NEGATIVE
Glucose, UA: NEGATIVE mg/dL
Ketones, ur: NEGATIVE mg/dL
Leukocytes, UA: NEGATIVE
Nitrite: NEGATIVE
Protein, ur: NEGATIVE mg/dL
Specific Gravity, Urine: 1.027 (ref 1.005–1.030)
WBC, UA: NONE SEEN WBC/hpf (ref 0–5)
pH: 5 (ref 5.0–8.0)

## 2016-07-13 LAB — CBC
HCT: 38.6 % (ref 36.0–46.0)
Hemoglobin: 12.8 g/dL (ref 12.0–15.0)
MCH: 28.6 pg (ref 26.0–34.0)
MCHC: 33.2 g/dL (ref 30.0–36.0)
MCV: 86.2 fL (ref 78.0–100.0)
Platelets: 220 10*3/uL (ref 150–400)
RBC: 4.48 MIL/uL (ref 3.87–5.11)
RDW: 13.1 % (ref 11.5–15.5)
WBC: 13.7 10*3/uL — ABNORMAL HIGH (ref 4.0–10.5)

## 2016-07-13 LAB — I-STAT BETA HCG BLOOD, ED (MC, WL, AP ONLY): I-stat hCG, quantitative: <5 m[IU]/mL (ref <5)

## 2016-07-13 LAB — LIPASE, BLOOD: LIPASE: 28 U/L (ref 11–51)

## 2016-07-13 MED ORDER — SODIUM CHLORIDE 0.9 % IV BOLUS (SEPSIS)
1000.0000 mL | Freq: Once | INTRAVENOUS | Status: AC
  Administered 2016-07-13: 1000 mL via INTRAVENOUS

## 2016-07-13 MED ORDER — ONDANSETRON HCL 4 MG/2ML IJ SOLN
4.0000 mg | Freq: Once | INTRAMUSCULAR | Status: AC
Start: 2016-07-13 — End: 2016-07-13
  Administered 2016-07-13: 4 mg via INTRAVENOUS
  Filled 2016-07-13: qty 2

## 2016-07-13 MED ORDER — PROMETHAZINE HCL 25 MG/ML IJ SOLN
12.5000 mg | Freq: Once | INTRAMUSCULAR | Status: AC
  Administered 2016-07-13: 12.5 mg via INTRAVENOUS
  Filled 2016-07-13: qty 1

## 2016-07-13 MED ORDER — FAMOTIDINE 20 MG PO TABS
20.0000 mg | ORAL_TABLET | Freq: Once | ORAL | Status: AC
  Administered 2016-07-13: 20 mg via ORAL
  Filled 2016-07-13: qty 1

## 2016-07-13 MED ORDER — PROMETHAZINE HCL 25 MG PO TABS: 25.0000 mg | ORAL_TABLET | Freq: Four times a day (QID) | ORAL | 0 refills | Status: DC | PRN

## 2016-07-13 NOTE — ED Provider Notes (Signed)
[facility]-EMERGENCY DEPT Provider Note   CSN: [id number] Arrival date & time: 07/13/16  0744     History   Chief Complaint Chief Complaint  Patient presents with  . Abdominal Pain  . Emesis    HPI Kimberly Fields is a 26 y.o. female.  HPI Kimberly Fields is a 26 y.o. female presents to emergency department complaining of abdominal pain, nausea, vomiting. She states symptoms started yesterday. Reports epigastric pain, numerous episodes of vomiting. Denies diarrhea. Denies any back pain. No urinary symptoms. No nasal congestion, sore throat. No cough. States symptoms started after eating dinner last night.  Past Medical History:  Diagnosis Date  . Anal fissure   . Anemia   . Asthma   . UTI (lower urinary tract infection)     Patient Active Problem List   Diagnosis Date Noted  . Bleeding internal hemorrhoids 02/11/2016  . Encounter for IUD insertion 02/03/2016  . Iron deficiency anemia 11/06/2015  . Anal fissure 11/12/2014    Past Surgical History:  Procedure Laterality Date  . iud placement  02/03/2016  . TONSILLECTOMY      OB History    Gravida Para Term Preterm AB Living   5 2 2  0 3 2   SAB TAB Ectopic Multiple Live Births   2 1 0 0 2       Home Medications    Prior to Admission medications   Medication Sig Start Date End Date Taking? Authorizing Provider  acetaminophen (TYLENOL) 325 MG tablet Take 650 mg by mouth every 6 (six) hours as needed for mild pain.    Yes Historical Provider, MD  albuterol (PROVENTIL HFA;VENTOLIN HFA) 108 (90 Base) MCG/ACT inhaler Inhale 2 puffs into the lungs every 4 (four) hours as needed for wheezing or shortness of breath. 07/02/15  Yes Christan Ciccarelli, PA-C  dimenhyDRINATE (DRAMAMINE) 50 MG tablet Take 50 mg by mouth every 8 (eight) hours as needed for nausea.   Yes Historical Provider, MD    Family History Family History  Problem Relation Age of Onset  . Migraines Mother   . Asthma Mother   . Cervical cancer Mother   .  COPD Maternal Grandmother   . Anxiety disorder Maternal Grandmother   . Coronary artery disease Maternal Grandmother   . Colon cancer Maternal Grandmother   . Coronary artery disease Maternal Grandfather   . Stroke Maternal Grandfather   . Tuberculosis Maternal Grandfather   . Skin cancer Maternal Grandfather   . Bipolar disorder Brother     Social History Social History  Substance Use Topics  . Smoking status: Never Smoker  . Smokeless tobacco: Never Used  . Alcohol use No     Allergies   Red dye   Review of Systems Review of Systems  Constitutional: Negative for chills and fever.  Respiratory: Negative for cough, chest tightness and shortness of breath.   Cardiovascular: Negative for chest pain, palpitations and leg swelling.  Gastrointestinal: Positive for abdominal pain, nausea and vomiting. Negative for diarrhea.  Genitourinary: Negative for dysuria, flank pain, pelvic pain, vaginal bleeding, vaginal discharge and vaginal pain.  Musculoskeletal: Negative for arthralgias, myalgias, neck pain and neck stiffness.  Skin: Negative for rash.  Neurological: Negative for dizziness, weakness and headaches.  All other systems reviewed and are negative.    Physical Exam Updated Vital Signs BP 132/85 (BP Location: Left Arm)   Pulse 107   Temp 98.2 F (36.8 C) (Oral)   Resp 18   Ht 5\' 3"  (1.6 m)  Wt 65.8 kg   LMP 06/16/2016   SpO2 95%   BMI 25.69 kg/m   Physical Exam  Constitutional: She appears well-developed and well-nourished. No distress.  HENT:  Head: Normocephalic.  Eyes: Conjunctivae are normal.  Neck: Neck supple.  Cardiovascular: Normal rate, regular rhythm and normal heart sounds.   Pulmonary/Chest: Effort normal and breath sounds normal. No respiratory distress. She has no wheezes. She has no rales.  Abdominal: Soft. Bowel sounds are normal. She exhibits no distension. There is tenderness. There is no rebound.  Epigastric tenderness  Musculoskeletal:  She exhibits no edema.  Neurological: She is alert.  Skin: Skin is warm and dry.  Psychiatric: She has a normal mood and affect. Her behavior is normal.  Nursing note and vitals reviewed.    ED Treatments / Results  Labs (all labs ordered are listed, but only abnormal results are displayed) Labs Reviewed  COMPREHENSIVE METABOLIC PANEL - Abnormal; Notable for the following:       Result Value   Glucose, Bld 109 (*)    BUN 26 (*)    ALT 11 (*)    All other components within normal limits  CBC - Abnormal; Notable for the following:    WBC 13.7 (*)    All other components within normal limits  URINALYSIS, ROUTINE W REFLEX MICROSCOPIC - Abnormal; Notable for the following:    APPearance TURBID (*)    Hgb urine dipstick SMALL (*)    Bacteria, UA RARE (*)    Squamous Epithelial / LPF 0-5 (*)    All other components within normal limits  LIPASE, BLOOD  I-STAT BETA HCG BLOOD, ED ([facility], [facility], [facility] ONLY)    EKG  EKG Interpretation None       Radiology [place] Abdomen Complete  Result Date: 07/13/2016 CLINICAL DATA:  Acute upper abdominal pain. EXAM: ABDOMEN ULTRASOUND COMPLETE COMPARISON:  CT scan of September 16, 2011. FINDINGS: Gallbladder: No gallstones or wall thickening visualized. No sonographic Murphy sign noted by sonographer. Common bile duct: Diameter: 1.8 mm which is within normal limits. Liver: No focal lesion identified. Within normal limits in parenchymal echogenicity. IVC: No abnormality visualized. Pancreas: Visualized portion unremarkable. Spleen: Size and appearance within normal limits. Right Kidney: Length: 11.2 cm. Echogenicity within normal limits. No mass or hydronephrosis visualized. Left Kidney: Length: 11.2 cm. Echogenicity within normal limits. No mass or hydronephrosis visualized. Abdominal aorta: No aneurysm visualized. Other findings: None. IMPRESSION: No abnormality seen in the abdomen. Electronically Signed   By: Lupita RaiderJames  Green Jr, M.D.   On: 07/13/2016 10:27     Procedures Procedures (including critical care time)  Medications Ordered in ED Medications  famotidine (PEPCID) tablet 20 mg (not administered)  promethazine (PHENERGAN) injection 12.5 mg (not administered)  sodium chloride 0.9 % bolus 1,000 mL (not administered)  sodium chloride 0.9 % bolus 1,000 mL (1,000 mLs Intravenous New Bag/Given 07/13/16 0902)  ondansetron (ZOFRAN) injection 4 mg (4 mg Intravenous Given 07/13/16 0902)     Initial Impression / Assessment and Plan / ED Course  I have reviewed the triage vital signs and the nursing notes.  Pertinent labs & imaging results that were available during my care of the patient were reviewed by me and considered in my medical decision making (see chart for details).     Patient seen and examined. Patient is nontoxic appearing. Epigastric pain with nausea and vomiting after eating dinner last 5. She is 4 months postpartum. Will check labs including LFTs, lipase, electrolytes, CBC.   Patient  is not pregnant. LFTs and lipase normal. WBC 13.7. We'll get ultrasound given recently postpartum with epigastric pain with nausea and vomiting. Zofran ordered for nausea.  11:48 AM Pt reassessed. Still nauseated. Pain not as severe. Pepcid, phenergan, more fluids ordered. [place] negative.   Patient feels much better after Phenergan. She is drinking ginger ale. Question gastritis versus gastroenteritis or food poisoning. She is nontoxic appearing. Vital signs are normal. Abdomen now non tender. She is stable for discharge home. Will give prescription for Phenergan. Return precautions discussed.   Vitals:   07/13/16 0755 07/13/16 1206  BP: 132/85 (!) 98/52  Pulse: 107 82  Resp: 18 16  Temp: 98.2 F (36.8 C) 98.2 F (36.8 C)  TempSrc: Oral Oral  SpO2: 95% 95%  Weight: 65.8 kg   Height: 5\' 3"  (1.6 m)      Final Clinical Impressions(s) / ED Diagnoses   Final diagnoses:  Abdominal pain  Nausea and vomiting, intractability of vomiting not  specified, unspecified vomiting type    New Prescriptions Discharge Medication List as of 07/13/2016  1:45 PM    START taking these medications   Details  promethazine (PHENERGAN) 25 MG tablet Take 1 tablet (25 mg total) by mouth every 6 (six) hours as needed for nausea or vomiting., Starting Tue 07/13/2016, Print         Jaynie Crumble, PA-C 07/13/16 1625    Laurence Spates, MD 07/17/16 1544

## 2016-07-13 NOTE — Discharge Instructions (Signed)
Tylenol/motrin for pain. Take phenergan as prescribed. Please follow up with family doctor if not improving, return if worsening.

## 2016-07-13 NOTE — ED Triage Notes (Signed)
Patient reports abdominal pain/emesis/back pain starting last night.

## 2017-02-11 ENCOUNTER — Emergency Department (HOSPITAL_COMMUNITY)
Admission: EM | Admit: 2017-02-11 | Discharge: 2017-02-11 | Disposition: A | Discharge status: Home / Self Care | Payer: Self-pay | Attending: Emergency Medicine | Admitting: Emergency Medicine

## 2017-02-11 ENCOUNTER — Encounter (HOSPITAL_COMMUNITY): Payer: Self-pay | Admitting: *Deleted

## 2017-02-11 DIAGNOSIS — J45909 Unspecified asthma, uncomplicated: Secondary | ICD-10-CM | POA: Insufficient documentation

## 2017-02-11 DIAGNOSIS — F17200 Nicotine dependence, unspecified, uncomplicated: Secondary | ICD-10-CM | POA: Insufficient documentation

## 2017-02-11 DIAGNOSIS — Z79899 Other long term (current) drug therapy: Secondary | ICD-10-CM | POA: Insufficient documentation

## 2017-02-11 DIAGNOSIS — J019 Acute sinusitis, unspecified: Secondary | ICD-10-CM | POA: Insufficient documentation

## 2017-02-11 MED ORDER — IBUPROFEN 600 MG PO TABS: 600.0000 mg | ORAL_TABLET | Freq: Four times a day (QID) | ORAL | 0 refills | Status: DC | PRN

## 2017-02-11 MED ORDER — AMOXICILLIN-POT CLAVULANATE 875-125 MG PO TABS
1.0000 | ORAL_TABLET | Freq: Once | ORAL | Status: AC
  Administered 2017-02-11: 1 via ORAL
  Filled 2017-02-11: qty 1

## 2017-02-11 MED ORDER — FLUTICASONE PROPIONATE 50 MCG/ACT NA SUSP: 2.0000 | Freq: Every day | NASAL | 0 refills | Status: DC

## 2017-02-11 MED ORDER — AMOXICILLIN-POT CLAVULANATE 875-125 MG PO TABS: 1.0000 | ORAL_TABLET | Freq: Two times a day (BID) | ORAL | 0 refills | Status: DC

## 2017-02-11 NOTE — ED Notes (Signed)
Pt departed in NAD, refused use of wheelchair.  

## 2017-02-11 NOTE — Discharge Instructions (Signed)
Take Augmentin as prescribed until finished. Continue with use of other supportive over the counter medications. Take ibuprofen for pain or headache. Return for worsening symptoms, persistent vision changes or vision loss, loss of consciousness, or fever over 100.82F despite use of antibiotics.

## 2017-02-11 NOTE — ED Provider Notes (Signed)
[facility]-EMERGENCY DEPT Provider Note   CSN: [id number] Arrival date & time: 02/11/17  1615    History   Chief Complaint Chief Complaint  Patient presents with  . Headache    HPI Kimberly Fields is a 26 y.o. female.  [age] year old female presents to the emergency department for evaluation of sinus congestion. She notes persistent symptoms over the past 2 weeks. She was exposed to her children who were sick recently. She notes persistent nasal congestion with postnasal drip and facial pain. She has also had intermittent headaches and blurry vision. She denies any blurry vision or headaches currently. She has not had any known fevers. The patient has tried over-the-counter remedies including Zyrtec-D and nasal sprays without relief. She has not had any known documented fevers. No dysphasia, cough, shortness of breath, V/D.   The history is provided by the patient. No language interpreter was used.  Headache      Past Medical History:  Diagnosis Date  . Anal fissure   . Anemia   . Asthma   . UTI (lower urinary tract infection)     Patient Active Problem List   Diagnosis Date Noted  . Bleeding internal hemorrhoids 02/11/2016  . Encounter for IUD insertion 02/03/2016  . Iron deficiency anemia 11/06/2015  . Anal fissure 11/12/2014    Past Surgical History:  Procedure Laterality Date  . iud placement  02/03/2016  . TONSILLECTOMY      OB History    Gravida Para Term Preterm AB Living   5 2 2  0 3 2   SAB TAB Ectopic Multiple Live Births   2 1 0 0 2       Home Medications    Prior to Admission medications   Medication Sig Start Date End Date Taking? Authorizing Provider  acetaminophen (TYLENOL) 325 MG tablet Take 650 mg by mouth every 6 (six) hours as needed for mild pain.     [provider]  albuterol (PROVENTIL HFA;VENTOLIN HFA) 108 (90 Base) MCG/ACT inhaler Inhale 2 puffs into the lungs every 4 (four) hours as needed for wheezing or shortness of breath. 07/02/15    Kirichenko, Tatyana, PA-C  amoxicillin-clavulanate (AUGMENTIN) 875-125 MG tablet Take 1 tablet by mouth every 12 (twelve) hours. 02/11/17   Antony MaduraHumes, Caylor Cerino, PA-C  dimenhyDRINATE (DRAMAMINE) 50 MG tablet Take 50 mg by mouth every 8 (eight) hours as needed for nausea.    [provider]  fluticasone (FLONASE) 50 MCG/ACT nasal spray Place 2 sprays into both nostrils daily. 02/11/17   Antony MaduraHumes, Opal Dinning, PA-C  ibuprofen (ADVIL,MOTRIN) 600 MG tablet Take 1 tablet (600 mg total) by mouth every 6 (six) hours as needed. 02/11/17   Antony MaduraHumes, Laketra Bowdish, PA-C  promethazine (PHENERGAN) 25 MG tablet Take 1 tablet (25 mg total) by mouth every 6 (six) hours as needed for nausea or vomiting. 07/13/16   Jaynie CrumbleKirichenko, Tatyana, PA-C    Family History Family History  Problem Relation Age of Onset  . Migraines Mother   . Asthma Mother   . Cervical cancer Mother   . COPD Maternal Grandmother   . Anxiety disorder Maternal Grandmother   . Coronary artery disease Maternal Grandmother   . Colon cancer Maternal Grandmother   . Coronary artery disease Maternal Grandfather   . Stroke Maternal Grandfather   . Tuberculosis Maternal Grandfather   . Skin cancer Maternal Grandfather   . Bipolar disorder Brother     Social History Social History  Substance Use Topics  . Smoking status: Never Smoker  .  Smokeless tobacco: Never Used  . Alcohol use No     Allergies   Red dye   Review of Systems Review of Systems  Neurological: Positive for headaches.  Ten systems reviewed and are negative for acute change, except as noted in the HPI.    Physical Exam Updated Vital Signs BP 116/73 (BP Location: Right Arm)   Pulse 83   Temp 98.4 F (36.9 C) (Oral)   Resp 16   Ht  (1.6 m)   Wt 65.8 kg (145 lb)   LMP 02/11/2017 (Approximate)   SpO2 98%   Breastfeeding? No   BMI 25.69 kg/m   Physical Exam  Constitutional: She is oriented to person, place, and time. She appears well-developed and well-nourished. No distress.    Nontoxic appearing and in NAD  HENT:  Head: Normocephalic and atraumatic.  Rawness to skin inferior to bilateral nares. Nares patent. Normal TMs bilaterally. No reproducible sinus tenderness. Oropharynx clear. Patient tolerating secretions without difficulty.  Eyes: Pupils are equal, round, and reactive to light. Conjunctivae and EOM are normal. No scleral icterus.  Neck: Normal range of motion.  No meningismus  Cardiovascular: Normal rate, regular rhythm and intact distal pulses.   Pulmonary/Chest: Effort normal. No respiratory distress. She has no wheezes. She has no rales.  Respirations even and unlabored. Lungs CTAB.  Musculoskeletal: Normal range of motion.  Neurological: She is alert and oriented to person, place, and time. She exhibits normal muscle tone. Coordination normal.  Skin: Skin is warm and dry. No rash noted. She is not diaphoretic. No erythema. No pallor.  Psychiatric: She has a normal mood and affect. Her behavior is normal.  Nursing note and vitals reviewed.    ED Treatments / Results  Labs (all labs ordered are listed, but only abnormal results are displayed) Labs Reviewed - No data to display  EKG  EKG Interpretation None       Radiology No results found.  Procedures Procedures (including critical care time)  Medications Ordered in ED Medications  amoxicillin-clavulanate (AUGMENTIN) 875-125 MG per tablet 1 tablet (1 tablet Oral Given 02/11/17 2215)     Initial Impression / Assessment and Plan / ED Course  I have reviewed the triage vital signs and the nursing notes.  Pertinent labs & imaging results that were available during my care of the patient were reviewed by me and considered in my medical decision making (see chart for details).     Patient complaining of symptoms of sinusitis.    Severe symptoms have been present for greater than 10 days with purulent nasal discharge and sinus pain. Concern for acute bacterial rhinosinusitis. Patient  discharged with Augmentin. Instructions given for warm saline nasal wash and recommendations for follow-up with primary care physician. Return precautions discussed and provided. Patient discharged in stable condition with no unaddressed concerns.   Final Clinical Impressions(s) / ED Diagnoses   Final diagnoses:  Acute sinusitis, recurrence not specified, unspecified location    New Prescriptions New Prescriptions   AMOXICILLIN-CLAVULANATE (AUGMENTIN) 875-125 MG TABLET    Take 1 tablet by mouth every 12 (twelve) hours.   FLUTICASONE (FLONASE) 50 MCG/ACT NASAL SPRAY    Place 2 sprays into both nostrils daily.   IBUPROFEN (ADVIL,MOTRIN) 600 MG TABLET    Take 1 tablet (600 mg total) by mouth every 6 (six) hours as needed.     Antony Madura, PA-C 02/11/17 2231    Linwood Dibbles, MD 02/11/17 [phone]

## 2017-02-11 NOTE — ED Triage Notes (Signed)
Pt c/o runny nose, body fatigue, blurred vision in both eyes & headache intermittently, pt denies current blurred vision and headache, A&O x4

## 2017-06-21 ENCOUNTER — Emergency Department (HOSPITAL_COMMUNITY)
Admission: EM | Admit: 2017-06-21 | Discharge: 2017-06-21 | Disposition: A | Discharge status: Home / Self Care | Payer: Self-pay | Attending: Emergency Medicine | Admitting: Emergency Medicine

## 2017-06-21 ENCOUNTER — Encounter (HOSPITAL_COMMUNITY): Payer: Self-pay | Admitting: Emergency Medicine

## 2017-06-21 ENCOUNTER — Other Ambulatory Visit: Payer: Self-pay

## 2017-06-21 DIAGNOSIS — Z79899 Other long term (current) drug therapy: Secondary | ICD-10-CM | POA: Insufficient documentation

## 2017-06-21 DIAGNOSIS — J069 Acute upper respiratory infection, unspecified: Secondary | ICD-10-CM | POA: Insufficient documentation

## 2017-06-21 LAB — CBC WITH DIFFERENTIAL/PLATELET
BASOS ABS: 0 10*3/uL (ref 0.0–0.1)
Basophils Relative: 0 %
EOS PCT: 2 %
Eosinophils Absolute: 0.1 10*3/uL (ref 0.0–0.7)
HCT: 37.5 % (ref 36.0–46.0)
Hemoglobin: 12.4 g/dL (ref 12.0–15.0)
LYMPHS PCT: 18 %
Lymphs Abs: 1.3 10*3/uL (ref 0.7–4.0)
MCH: 29.1 pg (ref 26.0–34.0)
MCHC: 33.1 g/dL (ref 30.0–36.0)
MCV: 88 fL (ref 78.0–100.0)
MONO ABS: 0.6 10*3/uL (ref 0.1–1.0)
Monocytes Relative: 9 %
Neutro Abs: 4.9 10*3/uL (ref 1.7–7.7)
Neutrophils Relative %: 71 %
PLATELETS: 214 10*3/uL (ref 150–400)
RBC: 4.26 MIL/uL (ref 3.87–5.11)
RDW: 13.1 % (ref 11.5–15.5)
WBC: 7 10*3/uL (ref 4.0–10.5)

## 2017-06-21 LAB — URINALYSIS, ROUTINE W REFLEX MICROSCOPIC
BILIRUBIN URINE: NEGATIVE
Glucose, UA: NEGATIVE mg/dL
HGB URINE DIPSTICK: NEGATIVE
Ketones, ur: NEGATIVE mg/dL
Leukocytes, UA: NEGATIVE
Nitrite: NEGATIVE
Protein, ur: NEGATIVE mg/dL
SPECIFIC GRAVITY, URINE: 1.017 (ref 1.005–1.030)
pH: 6 (ref 5.0–8.0)

## 2017-06-21 LAB — POC URINE PREG, ED: PREG TEST UR: NEGATIVE

## 2017-06-21 LAB — COMPREHENSIVE METABOLIC PANEL
ALT: 11 U/L — ABNORMAL LOW (ref 14–54)
ANION GAP: 7 (ref 5–15)
AST: 14 U/L — AB (ref 15–41)
Albumin: 3.7 g/dL (ref 3.5–5.0)
Alkaline Phosphatase: 56 U/L (ref 38–126)
BUN: 14 mg/dL (ref 6–20)
CO2: 26 mmol/L (ref 22–32)
Calcium: 9.1 mg/dL (ref 8.9–10.3)
Chloride: 107 mmol/L (ref 101–111)
Creatinine, Ser: 0.82 mg/dL (ref 0.44–1.00)
Glucose, Bld: 95 mg/dL (ref 65–99)
POTASSIUM: 4 mmol/L (ref 3.5–5.1)
Sodium: 140 mmol/L (ref 135–145)
Total Bilirubin: 0.5 mg/dL (ref 0.3–1.2)
Total Protein: 6.5 g/dL (ref 6.5–8.1)

## 2017-06-21 MED ORDER — AMOXICILLIN-POT CLAVULANATE 875-125 MG PO TABS: 1.0000 | ORAL_TABLET | Freq: Two times a day (BID) | ORAL | 0 refills | Status: DC

## 2017-06-21 NOTE — Discharge Instructions (Signed)
Please read the attached information.  Please continue symptomatic care.  If you find that symptoms persist beyond 7-10 days with sinus pressure and pain as discussed please initiate antibiotics.  If you develop any new or worsening signs or symptoms please return immediately to the emergency room for repeat evaluation.

## 2017-06-21 NOTE — ED Provider Notes (Signed)
[facility] EMERGENCY DEPARTMENT Provider Note   CSN: [id number] Arrival date & time: 06/21/17  1440     History   Chief Complaint Chief Complaint  Patient presents with  . URI  . Abdominal Pain    HPI Kimberly Fields is a 27 y.o. female.  HPI   27 year old female presents today with complaints of upper respiratory infection.  Patient notes that she has had off-and-on abdominal cramping over the last 4 days.  She notes symptoms started with runny nose, nasal congestion, fatigue and chills.  She denies any injectable fever at home.  She denies any chest pain, cough, shortness of breath.  Patient denies any abdominal pain with movement or palpation.  She notes normal urine and bowel movements today.  She denies any close sick contacts.  Patient does note a history of sinusitis in the past.  She does not smoke, does have a history of asthma.    Past Medical History:  Diagnosis Date  . Anal fissure   . Anemia   . Asthma   . UTI (lower urinary tract infection)     Patient Active Problem List   Diagnosis Date Noted  . Bleeding internal hemorrhoids 02/11/2016  . Encounter for IUD insertion 02/03/2016  . Iron deficiency anemia 11/06/2015  . Anal fissure 11/12/2014    Past Surgical History:  Procedure Laterality Date  . iud placement  02/03/2016  . TONSILLECTOMY      OB History    Gravida Para Term Preterm AB Living   5 2 2  0 3 2   SAB TAB Ectopic Multiple Live Births   2 1 0 0 2       Home Medications    Prior to Admission medications   Medication Sig Start Date End Date Taking? Authorizing Provider  acetaminophen (TYLENOL) 325 MG tablet Take 650 mg by mouth every 6 (six) hours as needed for mild pain.     [provider]  albuterol (PROVENTIL HFA;VENTOLIN HFA) 108 (90 Base) MCG/ACT inhaler Inhale 2 puffs into the lungs every 4 (four) hours as needed for wheezing or shortness of breath. 07/02/15   Kirichenko, Tatyana, PA-C    amoxicillin-clavulanate (AUGMENTIN) 875-125 MG tablet Take 1 tablet by mouth every 12 (twelve) hours. 06/21/17   Alexzandra Bilton, Tinnie Gens, PA-C  dimenhyDRINATE (DRAMAMINE) 50 MG tablet Take 50 mg by mouth every 8 (eight) hours as needed for nausea.    [provider]  fluticasone (FLONASE) 50 MCG/ACT nasal spray Place 2 sprays into both nostrils daily. 02/11/17   Antony Madura, PA-C  ibuprofen (ADVIL,MOTRIN) 600 MG tablet Take 1 tablet (600 mg total) by mouth every 6 (six) hours as needed. 02/11/17   Antony Madura, PA-C  promethazine (PHENERGAN) 25 MG tablet Take 1 tablet (25 mg total) by mouth every 6 (six) hours as needed for nausea or vomiting. 07/13/16   Jaynie Crumble, PA-C    Family History Family History  Problem Relation Age of Onset  . Migraines Mother   . Asthma Mother   . Cervical cancer Mother   . COPD Maternal Grandmother   . Anxiety disorder Maternal Grandmother   . Coronary artery disease Maternal Grandmother   . Colon cancer Maternal Grandmother   . Coronary artery disease Maternal Grandfather   . Stroke Maternal Grandfather   . Tuberculosis Maternal Grandfather   . Skin cancer Maternal Grandfather   . Bipolar disorder Brother     Social History Social History   Tobacco Use  . Smoking status:  Never Smoker  . Smokeless tobacco: Never Used  Substance Use Topics  . Alcohol use: No  . Drug use: No     Allergies   Red dye   Review of Systems Review of Systems  All other systems reviewed and are negative.    Physical Exam Updated Vital Signs BP 104/64   Pulse (!) 104   Temp 98.3 F (36.8 C) (Oral)   Resp 17   Ht 5\' 3"  (1.6 m)   Wt 65.8 kg (145 lb)   LMP 07/01/2016 (Exact Date)   SpO2 100%   BMI 25.69 kg/m   Physical Exam  Constitutional: She is oriented to person, place, and time. She appears well-developed and well-nourished.  HENT:  Head: Normocephalic and atraumatic.  Right Ear: Hearing, tympanic membrane and ear canal normal.  Left Ear:  Hearing, tympanic membrane and ear canal normal.  Mouth/Throat: Oropharynx is clear and moist and mucous membranes are normal. No oropharyngeal exudate, posterior oropharyngeal edema, posterior oropharyngeal erythema or tonsillar abscesses. Tonsils are 0 on the right. Tonsils are 0 on the left. No tonsillar exudate.  Rhinorrhea  Eyes: Conjunctivae are normal. Pupils are equal, round, and reactive to light. Right eye exhibits no discharge. Left eye exhibits no discharge. No scleral icterus.  Neck: Normal range of motion. No JVD present. No tracheal deviation present.  Pulmonary/Chest: Effort normal. No stridor.  Abdominal: Soft. She exhibits no distension and no mass. There is no tenderness. There is no rebound and no guarding. No hernia.  Neurological: She is alert and oriented to person, place, and time. Coordination normal.  Psychiatric: She has a normal mood and affect. Her behavior is normal. Judgment and thought content normal.  Nursing note and vitals reviewed.    ED Treatments / Results  Labs (all labs ordered are listed, but only abnormal results are displayed) Labs Reviewed  URINALYSIS, ROUTINE W REFLEX MICROSCOPIC - Abnormal; Notable for the following components:      Result Value   APPearance HAZY (*)    All other components within normal limits  COMPREHENSIVE METABOLIC PANEL - Abnormal; Notable for the following components:   AST 14 (*)    ALT 11 (*)    All other components within normal limits  CBC WITH DIFFERENTIAL/PLATELET  POC URINE PREG, ED    EKG  EKG Interpretation None       Radiology No results found.  Procedures Procedures (including critical care time)  Medications Ordered in ED Medications - No data to display   Initial Impression / Assessment and Plan / ED Course  I have reviewed the triage vital signs and the nursing notes.  Pertinent labs & imaging results that were available during my care of the patient were reviewed by me and considered  in my medical decision making (see chart for details).      Final Clinical Impressions(s) / ED Diagnoses   Final diagnoses:  Viral URI    Labs: Point-of-care urine.,  CBC, CMP, urinalysis  Imaging:  Consults:  Therapeutics:  Discharge Meds:   Assessment/Plan: 27 year old female presents today with complaints of upper respiratory infection.  This appears viral in nature she is well-appearing in no acute distress.  Patient has a soft nontender abdomen patient does have a history of sinusitis in the past and does not have a primary care.  I have discussed antibiotic usage for acute sinusitis, patient is completely reasonable and will only use the antibiotics as instructed.  She will follow-up immediately with any new or  worsening signs or symptoms, she verbalized understanding and agreement to today's plan had no further questions or concerns at the time of discharge.    ED Discharge Orders        Ordered    amoxicillin-clavulanate (AUGMENTIN) 875-125 MG tablet  Every 12 hours     06/21/17 1806       Eyvonne Mechanic, Cordelia Poche 06/21/17 1812    Arby Barrette, MD 06/30/17 1317

## 2017-06-21 NOTE — ED Triage Notes (Signed)
Pt c/o mid abd pain nausea without vomiting also cold symptoms x's 4 days.

## 2018-02-11 ENCOUNTER — Emergency Department (HOSPITAL_COMMUNITY)
Admission: EM | Admit: 2018-02-11 | Discharge: 2018-02-12 | Disposition: A | Discharge status: Home / Self Care | Payer: Self-pay | Attending: Emergency Medicine | Admitting: Emergency Medicine

## 2018-02-11 ENCOUNTER — Encounter (HOSPITAL_COMMUNITY): Payer: Self-pay | Admitting: Emergency Medicine

## 2018-02-11 DIAGNOSIS — J45909 Unspecified asthma, uncomplicated: Secondary | ICD-10-CM | POA: Insufficient documentation

## 2018-02-11 DIAGNOSIS — B9689 Other specified bacterial agents as the cause of diseases classified elsewhere: Secondary | ICD-10-CM

## 2018-02-11 DIAGNOSIS — Z79899 Other long term (current) drug therapy: Secondary | ICD-10-CM | POA: Insufficient documentation

## 2018-02-11 DIAGNOSIS — N760 Acute vaginitis: Secondary | ICD-10-CM | POA: Insufficient documentation

## 2018-02-11 DIAGNOSIS — N898 Other specified noninflammatory disorders of vagina: Secondary | ICD-10-CM

## 2018-02-11 LAB — URINALYSIS, ROUTINE W REFLEX MICROSCOPIC
BACTERIA UA: NONE SEEN
Bilirubin Urine: NEGATIVE
GLUCOSE, UA: NEGATIVE mg/dL
Ketones, ur: NEGATIVE mg/dL
NITRITE: NEGATIVE
PROTEIN: NEGATIVE mg/dL
SPECIFIC GRAVITY, URINE: 1.028 (ref 1.005–1.030)
pH: 5 (ref 5.0–8.0)

## 2018-02-11 LAB — WET PREP, GENITAL
SPERM: NONE SEEN
Trich, Wet Prep: NONE SEEN
YEAST WET PREP: NONE SEEN

## 2018-02-11 LAB — PREGNANCY, URINE: Preg Test, Ur: NEGATIVE

## 2018-02-11 MED ORDER — METRONIDAZOLE 500 MG PO TABS: 500.0000 mg | ORAL_TABLET | Freq: Two times a day (BID) | ORAL | 0 refills | Status: DC

## 2018-02-11 MED ORDER — METRONIDAZOLE 500 MG PO TABS
500.0000 mg | ORAL_TABLET | Freq: Once | ORAL | Status: AC
  Administered 2018-02-11: 500 mg via ORAL
  Filled 2018-02-11: qty 1

## 2018-02-11 NOTE — ED Provider Notes (Signed)
[facility] EMERGENCY DEPARTMENT Provider Note   CSN: [id number] Arrival date & time: 02/11/18  1656     History   Chief Complaint Chief Complaint  Patient presents with  . Vaginal Discharge    HPI Kimberly Fields is a 27 y.o. female.  No history of previus STIs in the patient or her partner. Has a mirena IUD placed 2 years ago. LMP 8/12.   The history is provided by the patient.  Vaginal Discharge   This is a new problem. The current episode started 2 days ago. The problem occurs constantly. The problem has been rapidly worsening. The discharge occurs spontaneously. The discharge was white and yellow. She is not pregnant. Pertinent negatives include no fever, no abdominal pain, no vomiting and no dysuria. Treatments tried: NSAIDs help, monistat made symptoms worse. Her past medical history does not include STD.    Past Medical History:  Diagnosis Date  . Anal fissure   . Anemia   . Asthma   . UTI (lower urinary tract infection)     Patient Active Problem List   Diagnosis Date Noted  . Bleeding internal hemorrhoids 02/11/2016  . Encounter for IUD insertion 02/03/2016  . Iron deficiency anemia 11/06/2015  . Anal fissure 11/12/2014    Past Surgical History:  Procedure Laterality Date  . iud placement  02/03/2016  . TONSILLECTOMY       OB History    Gravida  5   Para  2   Term  2   Preterm  0   AB  3   Living  2     SAB  2   TAB  1   Ectopic  0   Multiple  0   Live Births  2            Home Medications    Prior to Admission medications   Medication Sig Start Date End Date Taking? Authorizing Provider  acetaminophen (TYLENOL) 325 MG tablet Take 650 mg by mouth every 6 (six) hours as needed for mild pain.     [provider]  albuterol (PROVENTIL HFA;VENTOLIN HFA) 108 (90 Base) MCG/ACT inhaler Inhale 2 puffs into the lungs every 4 (four) hours as needed for wheezing or shortness of breath. 07/02/15   Kirichenko,  Tatyana, PA-C  amoxicillin-clavulanate (AUGMENTIN) 875-125 MG tablet Take 1 tablet by mouth every 12 (twelve) hours. 06/21/17   Hedges, Tinnie Gens, PA-C  dimenhyDRINATE (DRAMAMINE) 50 MG tablet Take 50 mg by mouth every 8 (eight) hours as needed for nausea.    [provider]  fluticasone (FLONASE) 50 MCG/ACT nasal spray Place 2 sprays into both nostrils daily. 02/11/17   Antony Madura, PA-C  ibuprofen (ADVIL,MOTRIN) 600 MG tablet Take 1 tablet (600 mg total) by mouth every 6 (six) hours as needed. 02/11/17   Antony Madura, PA-C  metroNIDAZOLE (FLAGYL) 500 MG tablet Take 1 tablet (500 mg total) by mouth 2 (two) times daily. 02/11/18   Nash Dimmer, MD  promethazine (PHENERGAN) 25 MG tablet Take 1 tablet (25 mg total) by mouth every 6 (six) hours as needed for nausea or vomiting. 07/13/16   Jaynie Crumble, PA-C    Family History Family History  Problem Relation Age of Onset  . Migraines Mother   . Asthma Mother   . Cervical cancer Mother   . COPD Maternal Grandmother   . Anxiety disorder Maternal Grandmother   . Coronary artery disease Maternal Grandmother   . Colon cancer Maternal Grandmother   .  Coronary artery disease Maternal Grandfather   . Stroke Maternal Grandfather   . Tuberculosis Maternal Grandfather   . Skin cancer Maternal Grandfather   . Bipolar disorder Brother     Social History Social History   Tobacco Use  . Smoking status: Never Smoker  . Smokeless tobacco: Never Used  Substance Use Topics  . Alcohol use: No  . Drug use: No     Allergies   Red dye   Review of Systems Review of Systems  Constitutional: Negative for chills and fever.  HENT: Negative for ear pain and sore throat.   Eyes: Negative for pain and visual disturbance.  Respiratory: Negative for cough and shortness of breath.   Cardiovascular: Negative for chest pain and palpitations.  Gastrointestinal: Negative for abdominal pain and vomiting.  Genitourinary: Positive for pelvic pain and  vaginal discharge. Negative for dysuria and hematuria.  Musculoskeletal: Negative for arthralgias and back pain.  Skin: Negative for color change and rash.  Neurological: Negative for seizures and syncope.  All other systems reviewed and are negative.    Physical Exam Updated Vital Signs BP 109/71   Pulse 96   Temp 99.2 F (37.3 C)   Resp 18   LMP 01/16/2018   SpO2 97%   Physical Exam  Constitutional: She appears well-developed and well-nourished. No distress.  HENT:  Head: Normocephalic and atraumatic.  Eyes: Conjunctivae are normal.  Neck: Neck supple.  Cardiovascular: Normal rate and regular rhythm.  No murmur heard. Pulmonary/Chest: Effort normal and breath sounds normal. No respiratory distress.  Abdominal: Soft. There is no tenderness.  Genitourinary: Cervix exhibits discharge. Cervix exhibits no motion tenderness. Right adnexum displays no tenderness. Left adnexum displays no tenderness. There is erythema in the vagina. No signs of injury around the vagina. Vaginal discharge (yellow) found.  Musculoskeletal: She exhibits no edema.  Neurological: She is alert.  Skin: Skin is warm and dry.  Psychiatric: She has a normal mood and affect.  Nursing note and vitals reviewed.    ED Treatments / Results  Labs (all labs ordered are listed, but only abnormal results are displayed) Labs Reviewed  WET PREP, GENITAL - Abnormal; Notable for the following components:      Result Value   Clue Cells Wet Prep HPF POC PRESENT (*)    WBC, Wet Prep HPF POC MANY (*)    All other components within normal limits  URINALYSIS, ROUTINE W REFLEX MICROSCOPIC - Abnormal; Notable for the following components:   Hgb urine dipstick SMALL (*)    Leukocytes, UA LARGE (*)    All other components within normal limits  PREGNANCY, URINE  GC/CHLAMYDIA PROBE AMP ([place]) NOT AT [facility]    EKG None  Radiology No results found.  Procedures Procedures (including critical care  time)  Medications Ordered in ED Medications  metroNIDAZOLE (FLAGYL) tablet 500 mg (500 mg Oral Given 02/11/18 2030)     Initial Impression / Assessment and Plan / ED Course  I have reviewed the triage vital signs and the nursing notes.  Pertinent labs & imaging results that were available during my care of the patient were reviewed by me and considered in my medical decision making (see chart for details).     The patient is a 27 year old G2, P2 female who presents with vaginal discharge for the past several days.  The patient reports that her discharge is worsening.  She denies any exposures to STIs.  She denies any abdominal pain, but does have some pelvic pain.  She denies dysuria.  Urinalysis shows no signs of urinary tract infection.  Wet prep reveals signs of bacterial vaginosis.  Urine pregnancy negative. GC, chlamydia, HIV, and syphilis testing pending.  Patient understands that she will have to call to follow-up on these results.  The patient does not have a primary care provider, and was given resources to establish care with a primary care provider for her follow-up.  The patient was discharged with metronidazole.  The patient reports understanding of and agreement with discharge instructions and return precautions.  Patient care supervised by Dr. Hyacinth Meeker.  Nash Dimmer, MD   Final Clinical Impressions(s) / ED Diagnoses   Final diagnoses:  BV (bacterial vaginosis)  Vaginal itching  Acute vaginitis    ED Discharge Orders         Ordered    metroNIDAZOLE (FLAGYL) 500 MG tablet  2 times daily     02/11/18 2019           Nash Dimmer, MD 02/12/18 2620    Eber Hong, MD 02/14/18 [phone]

## 2018-02-11 NOTE — ED Triage Notes (Signed)
Pt states vaginal swelling/pain and white/yellow discharge since weds. Last intercourse Monday. LMP September 12th.

## 2018-02-13 LAB — GC/CHLAMYDIA PROBE AMP (~~LOC~~) NOT AT ARMC
Chlamydia: NEGATIVE
Neisseria Gonorrhea: NEGATIVE

## 2018-03-21 ENCOUNTER — Ambulatory Visit: Payer: Self-pay | Admitting: Adult Health

## 2018-04-06 ENCOUNTER — Other Ambulatory Visit: Payer: Self-pay

## 2018-04-06 ENCOUNTER — Encounter (HOSPITAL_COMMUNITY): Payer: Self-pay | Admitting: Emergency Medicine

## 2018-04-06 ENCOUNTER — Emergency Department (HOSPITAL_COMMUNITY): Payer: Medicaid Other

## 2018-04-06 ENCOUNTER — Emergency Department (HOSPITAL_COMMUNITY)
Admission: EM | Admit: 2018-04-06 | Discharge: 2018-04-06 | Disposition: A | Discharge status: Home / Self Care | Payer: Medicaid Other | Attending: Emergency Medicine | Admitting: Emergency Medicine

## 2018-04-06 DIAGNOSIS — Z79899 Other long term (current) drug therapy: Secondary | ICD-10-CM | POA: Insufficient documentation

## 2018-04-06 DIAGNOSIS — J45909 Unspecified asthma, uncomplicated: Secondary | ICD-10-CM | POA: Insufficient documentation

## 2018-04-06 DIAGNOSIS — J069 Acute upper respiratory infection, unspecified: Secondary | ICD-10-CM | POA: Insufficient documentation

## 2018-04-06 LAB — CBC
HCT: 39.2 % (ref 36.0–46.0)
Hemoglobin: 12.7 g/dL (ref 12.0–15.0)
MCH: 28.9 pg (ref 26.0–34.0)
MCHC: 32.4 g/dL (ref 30.0–36.0)
MCV: 89.3 fL (ref 80.0–100.0)
NRBC: 0 % (ref 0.0–0.2)
PLATELETS: 196 10*3/uL (ref 150–400)
RBC: 4.39 MIL/uL (ref 3.87–5.11)
RDW: 13.2 % (ref 11.5–15.5)
WBC: 4 10*3/uL (ref 4.0–10.5)

## 2018-04-06 LAB — COMPREHENSIVE METABOLIC PANEL
ALK PHOS: 48 U/L (ref 38–126)
ALT: 12 U/L (ref 0–44)
ANION GAP: 9 (ref 5–15)
AST: 17 U/L (ref 15–41)
Albumin: 4 g/dL (ref 3.5–5.0)
BILIRUBIN TOTAL: 0.4 mg/dL (ref 0.3–1.2)
BUN: 15 mg/dL (ref 6–20)
CALCIUM: 9.2 mg/dL (ref 8.9–10.3)
CO2: 22 mmol/L (ref 22–32)
CREATININE: 0.7 mg/dL (ref 0.44–1.00)
Chloride: 107 mmol/L (ref 98–111)
Glucose, Bld: 129 mg/dL — ABNORMAL HIGH (ref 70–99)
Potassium: 3.6 mmol/L (ref 3.5–5.1)
Sodium: 138 mmol/L (ref 135–145)
TOTAL PROTEIN: 6.8 g/dL (ref 6.5–8.1)

## 2018-04-06 LAB — LIPASE, BLOOD: Lipase: 37 U/L (ref 11–51)

## 2018-04-06 LAB — I-STAT BETA HCG BLOOD, ED (MC, WL, AP ONLY)

## 2018-04-06 LAB — I-STAT TROPONIN, ED: TROPONIN I, POC: 0 ng/mL (ref 0.00–0.08)

## 2018-04-06 MED ORDER — LORATADINE 10 MG PO TABS: 10.0000 mg | ORAL_TABLET | Freq: Every day | ORAL | 0 refills | Status: DC

## 2018-04-06 MED ORDER — FLUTICASONE PROPIONATE 50 MCG/ACT NA SUSP: 1.0000 | Freq: Every day | NASAL | 2 refills | Status: DC

## 2018-04-06 NOTE — ED Notes (Signed)
EKG done at 1749, not crossing over into chart

## 2018-04-06 NOTE — ED Triage Notes (Addendum)
Pt reports waking up this morning with central chest pressure and intermittent tightness throughout the day that started this morning. Pt reports vomiting and diarrhea the last few days. Denies abd pain. Pt also reports nasal pain, generalized body aches and a productive cough for the last 2 weeks. Pt reports taking otc medications and Tylenol with some relief.

## 2018-04-06 NOTE — ED Provider Notes (Signed)
[facility] EMERGENCY DEPARTMENT Provider Note   CSN: [id number] Arrival date & time: 04/06/18  1732     History   Chief Complaint Chief Complaint  Patient presents with  . Chest Pain  . URI  . Diarrhea    HPI Kimberly Fields is a 27 y.o. female.  HPI    27 year old female presents today with complaints of upper respiratory infection.  Patient notes approximately 2 weeks ago she developed nasal congestion and rhinorrhea.  Patient notes 2 days ago she developed a dry nonproductive cough and chest tightness.  Patient denies any fever, denies any sore throat or ear pain.  She notes taking Tylenol cold and flu at home with no improvement in symptoms.  She notes hot flashes but denies any fever.  She notes several episodes of vomiting after coughing, no associated abdominal pain.  She denies shortness of breath.      Past Medical History:  Diagnosis Date  . Anal fissure   . Anemia   . Asthma   . UTI (lower urinary tract infection)     Patient Active Problem List   Diagnosis Date Noted  . Bleeding internal hemorrhoids 02/11/2016  . Encounter for IUD insertion 02/03/2016  . Iron deficiency anemia 11/06/2015  . Anal fissure 11/12/2014    Past Surgical History:  Procedure Laterality Date  . iud placement  02/03/2016  . TONSILLECTOMY       OB History    Gravida  5   Para  2   Term  2   Preterm  0   AB  3   Living  2     SAB  2   TAB  1   Ectopic  0   Multiple  0   Live Births  2            Home Medications    Prior to Admission medications   Medication Sig Start Date End Date Taking? Authorizing Provider  acetaminophen (TYLENOL) 325 MG tablet Take 650 mg by mouth every 6 (six) hours as needed for mild pain.     [provider]  albuterol (PROVENTIL HFA;VENTOLIN HFA) 108 (90 Base) MCG/ACT inhaler Inhale 2 puffs into the lungs every 4 (four) hours as needed for wheezing or shortness of breath. 07/02/15   Kirichenko,  Tatyana, PA-C  amoxicillin-clavulanate (AUGMENTIN) 875-125 MG tablet Take 1 tablet by mouth every 12 (twelve) hours. 06/21/17   Raelynn Corron, Tinnie Gens, PA-C  dimenhyDRINATE (DRAMAMINE) 50 MG tablet Take 50 mg by mouth every 8 (eight) hours as needed for nausea.    [provider]  fluticasone (FLONASE) 50 MCG/ACT nasal spray Place 1 spray into both nostrils daily. 04/06/18   Pasha Gadison, Tinnie Gens, PA-C  ibuprofen (ADVIL,MOTRIN) 600 MG tablet Take 1 tablet (600 mg total) by mouth every 6 (six) hours as needed. 02/11/17   Antony Madura, PA-C  loratadine (CLARITIN) 10 MG tablet Take 1 tablet (10 mg total) by mouth daily. 04/06/18   Joselin Crandell, Tinnie Gens, PA-C  metroNIDAZOLE (FLAGYL) 500 MG tablet Take 1 tablet (500 mg total) by mouth 2 (two) times daily. 02/11/18   Nash Dimmer, MD  promethazine (PHENERGAN) 25 MG tablet Take 1 tablet (25 mg total) by mouth every 6 (six) hours as needed for nausea or vomiting. 07/13/16   Jaynie Crumble, PA-C    Family History Family History  Problem Relation Age of Onset  . Migraines Mother   . Asthma Mother   . Cervical cancer Mother   . COPD Maternal  Grandmother   . Anxiety disorder Maternal Grandmother   . Coronary artery disease Maternal Grandmother   . Colon cancer Maternal Grandmother   . Coronary artery disease Maternal Grandfather   . Stroke Maternal Grandfather   . Tuberculosis Maternal Grandfather   . Skin cancer Maternal Grandfather   . Bipolar disorder Brother     Social History Social History   Tobacco Use  . Smoking status: Never Smoker  . Smokeless tobacco: Never Used  Substance Use Topics  . Alcohol use: No  . Drug use: No     Allergies   Red dye   Review of Systems Review of Systems  All other systems reviewed and are negative.    Physical Exam Updated Vital Signs BP 104/61   Pulse 87   Temp 98.9 F (37.2 C) (Oral)   Resp 18   Ht 5\' 5"  (1.651 m)   Wt 65.8 kg   LMP 03/11/2018   SpO2 100%   BMI 24.13 kg/m   Physical  Exam  Constitutional: She is oriented to person, place, and time. She appears well-developed and well-nourished.  HENT:  Head: Normocephalic and atraumatic.  Oropharynx clear without swelling or edema- no lymphadenopathy - turbinates with minor edema-rhinorrhea noted  Eyes: Pupils are equal, round, and reactive to light. Conjunctivae are normal. Right eye exhibits no discharge. Left eye exhibits no discharge. No scleral icterus.  Neck: Normal range of motion. No JVD present. No tracheal deviation present.  Cardiovascular: Regular rhythm, normal heart sounds and intact distal pulses. Exam reveals no gallop and no friction rub.  No murmur heard. Pulmonary/Chest: Effort normal and breath sounds normal. No stridor. No respiratory distress. She has no wheezes. She has no rales. She exhibits no tenderness.  Abdominal: Soft. She exhibits no distension and no mass. There is no tenderness. There is no rebound and no guarding.  Neurological: She is alert and oriented to person, place, and time. Coordination normal.  Psychiatric: She has a normal mood and affect. Her behavior is normal. Judgment and thought content normal.  Nursing note and vitals reviewed.    ED Treatments / Results  Labs (all labs ordered are listed, but only abnormal results are displayed) Labs Reviewed  COMPREHENSIVE METABOLIC PANEL - Abnormal; Notable for the following components:      Result Value   Glucose, Bld 129 (*)    All other components within normal limits  LIPASE, BLOOD  CBC  I-STAT BETA HCG BLOOD, ED ([facility], [facility], [facility] ONLY)  I-STAT TROPONIN, ED    EKG None  Radiology Dg Chest 2 View  Result Date: 04/06/2018 CLINICAL DATA:  Right-sided chest pain and dyspnea on exertion x2 days. EXAM: CHEST - 2 VIEW COMPARISON:  07/01/2015 FINDINGS: Normal heart size and mediastinal contours. Lungs are clear. No acute osseous abnormality. IMPRESSION: No active cardiopulmonary disease.  Stable appearance of the chest.  Electronically Signed   By: Tollie Eth M.D.   On: 04/06/2018 19:53    Procedures Procedures (including critical care time)  Medications Ordered in ED Medications - No data to display   Initial Impression / Assessment and Plan / ED Course  I have reviewed the triage vital signs and the nursing notes.  Pertinent labs & imaging results that were available during my care of the patient were reviewed by me and considered in my medical decision making (see chart for details).     Labs: I stat beta hcg, I stat trop, lipase, cmp,  Imaging:  Consults:  Therapeutics:  Discharge Meds: Flonase, Claritin  Assessment/Plan: 27 year old female presents today with likely viral URI.  She is well-appearing in no acute distress.  She will be started on Flonase, encouraged follow-up as an outpatient with primary care, strict return precautions given, she verbalized understanding and agreement to today's plan had no further questions or concerns.   Final Clinical Impressions(s) / ED Diagnoses   Final diagnoses:  Upper respiratory tract infection, unspecified type    ED Discharge Orders         Ordered    fluticasone (FLONASE) 50 MCG/ACT nasal spray  Daily     04/06/18 2031    loratadine (CLARITIN) 10 MG tablet  Daily     04/06/18 2031           Eyvonne Mechanic, PA-C 04/06/18 2203    Margarita Grizzle, MD 04/07/18 1424

## 2018-04-06 NOTE — Discharge Instructions (Addendum)
Please read attached information. If you experience any new or worsening signs or symptoms please return to the emergency room for evaluation. Please follow-up with your primary care provider or specialist as discussed. Please use medication prescribed only as directed and discontinue taking if you have any concerning signs or symptoms.   °

## 2018-04-06 NOTE — ED Notes (Signed)
Patient transported to X-ray 

## 2018-04-18 ENCOUNTER — Emergency Department (HOSPITAL_COMMUNITY)
Admission: EM | Admit: 2018-04-18 | Discharge: 2018-04-18 | Disposition: A | Discharge status: Home / Self Care | Payer: Self-pay | Attending: Emergency Medicine | Admitting: Emergency Medicine

## 2018-04-18 ENCOUNTER — Encounter (HOSPITAL_COMMUNITY): Payer: Self-pay | Admitting: Emergency Medicine

## 2018-04-18 ENCOUNTER — Other Ambulatory Visit: Payer: Self-pay

## 2018-04-18 ENCOUNTER — Emergency Department (HOSPITAL_COMMUNITY): Payer: Self-pay

## 2018-04-18 DIAGNOSIS — K529 Noninfective gastroenteritis and colitis, unspecified: Secondary | ICD-10-CM | POA: Insufficient documentation

## 2018-04-18 DIAGNOSIS — Z79899 Other long term (current) drug therapy: Secondary | ICD-10-CM | POA: Insufficient documentation

## 2018-04-18 DIAGNOSIS — J45909 Unspecified asthma, uncomplicated: Secondary | ICD-10-CM | POA: Insufficient documentation

## 2018-04-18 LAB — COMPREHENSIVE METABOLIC PANEL
ALT: 14 U/L (ref 0–44)
AST: 16 U/L (ref 15–41)
Albumin: 4.7 g/dL (ref 3.5–5.0)
Alkaline Phosphatase: 60 U/L (ref 38–126)
Anion gap: 8 (ref 5–15)
BUN: 17 mg/dL (ref 6–20)
CHLORIDE: 102 mmol/L (ref 98–111)
CO2: 27 mmol/L (ref 22–32)
Calcium: 9.6 mg/dL (ref 8.9–10.3)
Creatinine, Ser: 0.7 mg/dL (ref 0.44–1.00)
Glucose, Bld: 103 mg/dL — ABNORMAL HIGH (ref 70–99)
POTASSIUM: 3.8 mmol/L (ref 3.5–5.1)
SODIUM: 137 mmol/L (ref 135–145)
Total Bilirubin: 0.7 mg/dL (ref 0.3–1.2)
Total Protein: 8.2 g/dL — ABNORMAL HIGH (ref 6.5–8.1)

## 2018-04-18 LAB — CBC WITH DIFFERENTIAL/PLATELET
ABS IMMATURE GRANULOCYTES: 0.08 10*3/uL — AB (ref 0.00–0.07)
BASOS ABS: 0 10*3/uL (ref 0.0–0.1)
Basophils Relative: 0 %
Eosinophils Absolute: 0.1 10*3/uL (ref 0.0–0.5)
Eosinophils Relative: 0 %
HCT: 47.5 % — ABNORMAL HIGH (ref 36.0–46.0)
HEMOGLOBIN: 15.3 g/dL — AB (ref 12.0–15.0)
IMMATURE GRANULOCYTES: 1 %
LYMPHS ABS: 0.5 10*3/uL — AB (ref 0.7–4.0)
LYMPHS PCT: 3 %
MCH: 28.8 pg (ref 26.0–34.0)
MCHC: 32.2 g/dL (ref 30.0–36.0)
MCV: 89.5 fL (ref 80.0–100.0)
Monocytes Absolute: 0.6 10*3/uL (ref 0.1–1.0)
Monocytes Relative: 4 %
NEUTROS ABS: 14.9 10*3/uL — AB (ref 1.7–7.7)
NEUTROS PCT: 92 %
NRBC: 0 % (ref 0.0–0.2)
Platelets: 308 10*3/uL (ref 150–400)
RBC: 5.31 MIL/uL — AB (ref 3.87–5.11)
RDW: 12.7 % (ref 11.5–15.5)
WBC: 16.2 10*3/uL — AB (ref 4.0–10.5)

## 2018-04-18 LAB — URINALYSIS, ROUTINE W REFLEX MICROSCOPIC
Bilirubin Urine: NEGATIVE
GLUCOSE, UA: NEGATIVE mg/dL
Hgb urine dipstick: NEGATIVE
KETONES UR: 80 mg/dL — AB
LEUKOCYTES UA: NEGATIVE
NITRITE: NEGATIVE
PROTEIN: NEGATIVE mg/dL
Specific Gravity, Urine: 1.027 (ref 1.005–1.030)
pH: 5 (ref 5.0–8.0)

## 2018-04-18 LAB — I-STAT BETA HCG BLOOD, ED (MC, WL, AP ONLY)

## 2018-04-18 MED ORDER — IOHEXOL 300 MG/ML  SOLN
100.0000 mL | Freq: Once | INTRAMUSCULAR | Status: AC | PRN
  Administered 2018-04-18: 100 mL via INTRAVENOUS

## 2018-04-18 MED ORDER — ONDANSETRON HCL 4 MG/2ML IJ SOLN
4.0000 mg | Freq: Once | INTRAMUSCULAR | Status: AC
  Administered 2018-04-18: 4 mg via INTRAVENOUS
  Filled 2018-04-18: qty 2

## 2018-04-18 MED ORDER — SODIUM CHLORIDE 0.9 % IV BOLUS
1000.0000 mL | Freq: Once | INTRAVENOUS | Status: AC
  Administered 2018-04-18: 1000 mL via INTRAVENOUS

## 2018-04-18 MED ORDER — ONDANSETRON 4 MG PO TBDP: 4.0000 mg | ORAL_TABLET | Freq: Three times a day (TID) | ORAL | 0 refills | Status: DC | PRN

## 2018-04-18 MED ORDER — BENZONATATE 100 MG PO CAPS: 100.0000 mg | ORAL_CAPSULE | Freq: Three times a day (TID) | ORAL | 0 refills | Status: DC

## 2018-04-18 MED ORDER — FAMOTIDINE 20 MG PO TABS: 20.0000 mg | ORAL_TABLET | Freq: Two times a day (BID) | ORAL | 0 refills | Status: DC

## 2018-04-18 NOTE — ED Triage Notes (Signed)
Pt reports cough x 2 weeks. Pt reports abdominal x 1 day. Pt reports N/V x 1 day. Pt reports her family has had cold symptoms. Pt denies diarrhea.

## 2018-04-18 NOTE — ED Notes (Signed)
PtPt in xray, Rn to draw labs from IV in xray, Rn to draw labs from IV

## 2018-04-18 NOTE — ED Notes (Signed)
Patient verbalizes understanding of discharge instructions. Opportunity for questioning and answers were provided. Armband removed by staff, pt discharged from ED.  

## 2018-04-18 NOTE — ED Notes (Signed)
Patient transported to X-ray 

## 2018-04-18 NOTE — ED Notes (Signed)
Patient transported to CT 

## 2018-04-18 NOTE — ED Notes (Signed)
ED Provider at bedside. 

## 2018-04-18 NOTE — ED Provider Notes (Signed)
[facility] EMERGENCY DEPARTMENT Provider Note   CSN: [id number] Arrival date & time: 04/18/18  1848     History   Chief Complaint Chief Complaint  Patient presents with  . Abdominal Pain  . Emesis    HPI Kimberly Fields is a 27 y.o. female, with no significant past medical history, presents to ED for cough for 2 weeks, abdominal pain since yesterday.  She also reports nausea, nonbloody, nonbilious emesis since yesterday as well.  Sick contacts including her children with similar symptoms.  She reports diarrhea as well.  She has tried several over-the-counter medications and antihistamines with only mild improvement in her symptoms.  Denies any fevers but reports chills.  Denies any shortness of breath, prior abdominal surgeries, alcohol, tobacco or other drug use.  Denies urinary symptoms or vaginal complaints.  HPI  Past Medical History:  Diagnosis Date  . Anal fissure   . Anemia   . Asthma   . UTI (lower urinary tract infection)     Patient Active Problem List   Diagnosis Date Noted  . Bleeding internal hemorrhoids 02/11/2016  . Encounter for IUD insertion 02/03/2016  . Iron deficiency anemia 11/06/2015  . Anal fissure 11/12/2014    Past Surgical History:  Procedure Laterality Date  . iud placement  02/03/2016  . TONSILLECTOMY       OB History    Gravida  5   Para  2   Term  2   Preterm  0   AB  3   Living  2     SAB  2   TAB  1   Ectopic  0   Multiple  0   Live Births  2            Home Medications    Prior to Admission medications   Medication Sig Start Date End Date Taking? Authorizing Provider  acetaminophen (TYLENOL) 325 MG tablet Take 650 mg by mouth every 6 (six) hours as needed for mild pain.     [provider]  albuterol (PROVENTIL HFA;VENTOLIN HFA) 108 (90 Base) MCG/ACT inhaler Inhale 2 puffs into the lungs every 4 (four) hours as needed for wheezing or shortness of breath. 07/02/15   Kirichenko,  Tatyana, PA-C  amoxicillin-clavulanate (AUGMENTIN) 875-125 MG tablet Take 1 tablet by mouth every 12 (twelve) hours. 06/21/17   Hedges, Tinnie Gens, PA-C  benzonatate (TESSALON) 100 MG capsule Take 1 capsule (100 mg total) by mouth every 8 (eight) hours. 04/18/18   Dahl Higinbotham, PA-C  dimenhyDRINATE (DRAMAMINE) 50 MG tablet Take 50 mg by mouth every 8 (eight) hours as needed for nausea.    [provider]  famotidine (PEPCID) 20 MG tablet Take 1 tablet (20 mg total) by mouth 2 (two) times daily. 04/18/18   Maragret Vanacker, PA-C  fluticasone (FLONASE) 50 MCG/ACT nasal spray Place 1 spray into both nostrils daily. 04/06/18   Hedges, Tinnie Gens, PA-C  ibuprofen (ADVIL,MOTRIN) 600 MG tablet Take 1 tablet (600 mg total) by mouth every 6 (six) hours as needed. 02/11/17   Antony Madura, PA-C  loratadine (CLARITIN) 10 MG tablet Take 1 tablet (10 mg total) by mouth daily. 04/06/18   Hedges, Tinnie Gens, PA-C  metroNIDAZOLE (FLAGYL) 500 MG tablet Take 1 tablet (500 mg total) by mouth 2 (two) times daily. 02/11/18   Nash Dimmer, MD  ondansetron (ZOFRAN ODT) 4 MG disintegrating tablet Take 1 tablet (4 mg total) by mouth every 8 (eight) hours as needed for nausea or vomiting. 04/18/18  Ermon Sagan, PA-C  promethazine (PHENERGAN) 25 MG tablet Take 1 tablet (25 mg total) by mouth every 6 (six) hours as needed for nausea or vomiting. 07/13/16   Jaynie Crumble, PA-C    Family History Family History  Problem Relation Age of Onset  . Migraines Mother   . Asthma Mother   . Cervical cancer Mother   . COPD Maternal Grandmother   . Anxiety disorder Maternal Grandmother   . Coronary artery disease Maternal Grandmother   . Colon cancer Maternal Grandmother   . Coronary artery disease Maternal Grandfather   . Stroke Maternal Grandfather   . Tuberculosis Maternal Grandfather   . Skin cancer Maternal Grandfather   . Bipolar disorder Brother     Social History Social History   Tobacco Use  . Smoking status:  Never Smoker  . Smokeless tobacco: Never Used  Substance Use Topics  . Alcohol use: No  . Drug use: No     Allergies   Red dye   Review of Systems Review of Systems  Constitutional: Negative for appetite change, chills and fever.  HENT: Negative for ear pain, rhinorrhea, sneezing and sore throat.   Eyes: Negative for photophobia and visual disturbance.  Respiratory: Positive for cough. Negative for chest tightness, shortness of breath and wheezing.   Cardiovascular: Negative for chest pain and palpitations.  Gastrointestinal: Positive for abdominal pain, diarrhea, nausea and vomiting. Negative for blood in stool and constipation.  Genitourinary: Negative for dysuria, hematuria and urgency.  Musculoskeletal: Negative for myalgias.  Skin: Negative for rash.  Neurological: Negative for dizziness, weakness and light-headedness.     Physical Exam Updated Vital Signs BP (!) 136/98   Pulse 91   Temp 98.2 F (36.8 C) (Oral)   Resp 18   LMP 04/09/2018   SpO2 100%   Physical Exam  Constitutional: She appears well-developed and well-nourished. No distress.  HENT:  Head: Normocephalic and atraumatic.  Nose: Nose normal.  Eyes: Conjunctivae and EOM are normal. Left eye exhibits no discharge. No scleral icterus.  Neck: Normal range of motion. Neck supple.  Cardiovascular: Normal rate, regular rhythm, normal heart sounds and intact distal pulses. Exam reveals no gallop and no friction rub.  No murmur heard. Pulmonary/Chest: Effort normal and breath sounds normal. No respiratory distress.  Abdominal: Soft. Bowel sounds are normal. She exhibits no distension. There is generalized tenderness. There is no rebound and no guarding.  Musculoskeletal: Normal range of motion. She exhibits no edema.  Neurological: She is alert. She exhibits normal muscle tone. Coordination normal.  Skin: Skin is warm and dry. No rash noted.  Psychiatric: She has a normal mood and affect.  Nursing note and  vitals reviewed.    ED Treatments / Results  Labs (all labs ordered are listed, but only abnormal results are displayed) Labs Reviewed  COMPREHENSIVE METABOLIC PANEL - Abnormal; Notable for the following components:      Result Value   Glucose, Bld 103 (*)    Total Protein 8.2 (*)    All other components within normal limits  CBC WITH DIFFERENTIAL/PLATELET - Abnormal; Notable for the following components:   WBC 16.2 (*)    RBC 5.31 (*)    Hemoglobin 15.3 (*)    HCT 47.5 (*)    Neutro Abs 14.9 (*)    Lymphs Abs 0.5 (*)    Abs Immature Granulocytes 0.08 (*)    All other components within normal limits  URINALYSIS, ROUTINE W REFLEX MICROSCOPIC - Abnormal; Notable for the following  components:   APPearance HAZY (*)    Ketones, ur 80 (*)    All other components within normal limits  I-STAT BETA HCG BLOOD, ED ([facility], [facility], [facility] ONLY)    EKG None  Radiology Dg Chest 2 View  Result Date: 04/18/2018 CLINICAL DATA:  Dry cough for a month. EXAM: CHEST - 2 VIEW COMPARISON:  April 06, 2018 FINDINGS: The heart size and mediastinal contours are within normal limits. Both lungs are clear. The visualized skeletal structures are unremarkable. IMPRESSION: No active cardiopulmonary disease. Electronically Signed   By: Gerome Sam III M.D   On: 04/18/2018 19:54   Ct Abdomen Pelvis W Contrast  Result Date: 04/18/2018 CLINICAL DATA:  Abdominal pain with nausea and vomiting beginning yesterday. EXAM: CT ABDOMEN AND PELVIS WITH CONTRAST TECHNIQUE: Multidetector CT imaging of the abdomen and pelvis was performed using the standard protocol following bolus administration of intravenous contrast. CONTRAST:  OMNIPAQUE IOHEXOL 300 MG/ML  SOLN COMPARISON:  None. FINDINGS: Lower chest: Trace fluid in the distal esophagus query reflux. Small hiatal hernia. Clear lung bases. Hepatobiliary: No focal liver abnormality is seen. No gallstones, gallbladder wall thickening, or biliary dilatation. Pancreas:  Unremarkable. No pancreatic ductal dilatation or surrounding inflammatory changes. Spleen: Normal in size without focal abnormality. Adrenals/Urinary Tract: Adrenal glands are unremarkable. Kidneys are normal, without renal calculi, focal lesion, or hydronephrosis. Bladder is unremarkable. Stomach/Bowel: Slight fluid-filled distention of proximal jejunal loops question enteritis. Normal-appearing appendix. Decompressed rectosigmoid limiting assessment. Moderate stool burden in the distal descending and proximal sigmoid colon. No mechanical bowel obstruction. Vascular/Lymphatic: No significant vascular findings are present. No enlarged abdominal or pelvic lymph nodes. Reproductive: Uterus and bilateral adnexa are unremarkable. IUD noted within the expected location of the endometrial cavity. Other: No free air or free fluid. Musculoskeletal: No acute or significant osseous findings. IMPRESSION: 1. Mild fluid-filled distention of proximal jejunum suspicious for enteritis. No mechanical bowel obstruction. 2. IUD noted within the expected location of the endometrial cavity. Electronically Signed   By: Tollie Eth M.D.   On: 04/18/2018 21:03    Procedures Procedures (including critical care time)  Medications Ordered in ED Medications  sodium chloride 0.9 % bolus 1,000 mL (1,000 mLs Intravenous New Bag/Given 04/18/18 2006)  ondansetron (ZOFRAN) injection 4 mg (4 mg Intravenous Given 04/18/18 2009)  iohexol (OMNIPAQUE) 300 MG/ML solution 100 mL (100 mLs Intravenous Contrast Given 04/18/18 2043)     Initial Impression / Assessment and Plan / ED Course  I have reviewed the triage vital signs and the nursing notes.  Pertinent labs & imaging results that were available during my care of the patient were reviewed by me and considered in my medical decision making (see chart for details).  Clinical Course as of Apr 18 2114  Tue Apr 18, 2018  1854 Pulse Rate(!): 114 [HK]  2023 WBC(!): 16.2 [HK]  2033 Pulse  Rate: 91 [HK]    Clinical Course User Index [HK] Dietrich Pates, PA-C    27 year old female presents to ED for 2-week history of cough, 1 day history of abdominal pain and nonbloody, nonbilious emesis.  Sick contacts including children with similar symptoms although there is has improved.  Patient was seen and evaluated on 10/31 for cough with no significant findings.  On exam patient mildly tachycardic to low 110s.  Lungs are clear to auscultation bilaterally.  Abdomen is generally tender without rebound or guarding.  She is afebrile with no recent use of antipyretics.  Plan is to obtain lab work, imaging  and reassess after fluids and Zofran.    9:15 PM Lab work significant for leukocytosis of 16 which is significantly higher than her visit 12 days ago.  Chest x-ray unremarkable.  BMP, urine unremarkable.  CT shows findings consistent with enteritis.  Patient given fluids, Zofran with significant improvement in her symptoms and heart rate.  Suspect that her symptoms are viral in nature.  She could have some component of reflux that is causing her to cough as well.  Will give Pepcid, Tessalon Perles and Zofran as needed for nausea.  Patient advised to slowly advance her diet as tolerated and to return to ED for any severe worsening symptoms.  Patient is agreeable to plan  Patient is hemodynamically stable, in NAD, and able to ambulate in the ED. Evaluation does not show pathology that would require ongoing emergent intervention or inpatient treatment. I explained the diagnosis to the patient. Pain has been managed and has no complaints prior to discharge. Patient is comfortable with above plan and is stable for discharge at this time. All questions were answered prior to disposition. Strict return precautions for returning to the ED were discussed. Encouraged follow up with PCP.    Portions of this note were generated with Scientist, clinical (histocompatibility and immunogenetics). Dictation errors may occur despite best attempts at  proofreading.  Final Clinical Impressions(s) / ED Diagnoses   Final diagnoses:  Gastroenteritis    ED Discharge Orders         Ordered    famotidine (PEPCID) 20 MG tablet  2 times daily     04/18/18 2114    ondansetron (ZOFRAN ODT) 4 MG disintegrating tablet  Every 8 hours PRN     04/18/18 2114    benzonatate (TESSALON) 100 MG capsule  Every 8 hours     04/18/18 2114           Dietrich Pates, PA-C 04/18/18 2115    Doug Sou, MD 04/19/18 [phone]

## 2018-04-18 NOTE — Discharge Instructions (Signed)
Return to ED for worsening symptoms, trouble breathing or trouble swallowing, vomiting or coughing up blood, chest pain.

## 2018-06-27 ENCOUNTER — Ambulatory Visit
Admission: EM | Admit: 2018-06-27 | Discharge: 2018-06-27 | Disposition: A | Discharge status: Home / Self Care | Payer: BLUE CROSS/BLUE SHIELD | Attending: Emergency Medicine | Admitting: Emergency Medicine

## 2018-06-27 ENCOUNTER — Encounter: Payer: Self-pay | Admitting: Emergency Medicine

## 2018-06-27 DIAGNOSIS — R05 Cough: Secondary | ICD-10-CM | POA: Diagnosis not present

## 2018-06-27 DIAGNOSIS — R059 Cough, unspecified: Secondary | ICD-10-CM

## 2018-06-27 MED ORDER — HYDROCOD POLST-CPM POLST ER 10-8 MG/5ML PO SUER: 5.0000 mL | Freq: Two times a day (BID) | ORAL | 0 refills | Status: DC | PRN

## 2018-06-27 MED ORDER — PREDNISONE 50 MG PO TABS: 50.0000 mg | ORAL_TABLET | Freq: Every day | ORAL | 0 refills | Status: AC

## 2018-06-27 MED ORDER — AEROCHAMBER PLUS MISC: 2 refills | Status: DC

## 2018-06-27 MED ORDER — DOXYCYCLINE HYCLATE 100 MG PO CAPS: 100.0000 mg | ORAL_CAPSULE | Freq: Two times a day (BID) | ORAL | 0 refills | Status: AC

## 2018-06-27 MED ORDER — ALBUTEROL SULFATE HFA 108 (90 BASE) MCG/ACT IN AERS: 1.0000 | INHALATION_SPRAY | Freq: Four times a day (QID) | RESPIRATORY_TRACT | 0 refills | Status: DC | PRN

## 2018-06-27 MED ORDER — FLUTICASONE PROPIONATE 50 MCG/ACT NA SUSP: 2.0000 | Freq: Every day | NASAL | 0 refills | Status: DC

## 2018-06-27 NOTE — ED Notes (Signed)
Patient able to ambulate independently  

## 2018-06-27 NOTE — ED Triage Notes (Signed)
Pt presents to [facility] for assessment of cough x 1 week, dry, worse at night.  Also c/o left ear pain.  Denies nasal congestion or fever.

## 2018-06-27 NOTE — Discharge Instructions (Addendum)
2 puffs from your albuterol inhaler your spacer every 4 hours for the next 2 to 3 days, then back off to every 6 hours for the 2 days after that.  Then you may use the albuterol as needed.  Flonase, finish the prednisone, start saline nasal irrigation with a Lloyd Huger med sinus rinse and distilled water as often as you want, do not fill the doxycycline for another 3 days or unless you start to have fevers above 100.4.  Follow-up with a primary care patient of your choice, see list below.  Below is a list of primary care practices who are taking new patients for you to follow-up with.  [facility] at [facility] [address] [place], [place] [address] [phone]  [facility] [address] [place], [place] [address] [phone]  [facility] [phone] [address] [place] [address]  [facility]: [address] [place] [address]  [phone]  [facility]: [address] [place] [address]   [phone]  [facility]: [address] [place] [address]  [phone]  [facility] : [address] [place] [address] [phone]  [facility]: [address] [place] [address] [phone]  [facility]: [address] [place] [address] [phone]  Dr. Oneal Grout [address] [facility] [place] [address]  [phone]  Dr. Jackie Plum, [facility]. [address] [place], [place] [address]  [phone]  Go to www.goodrx.com to look up your medications. This will give you a list of where you can find your prescriptions at the most affordable prices. Or ask the pharmacist what the cash price  is, or if they have any other discount programs available to help make your medication more affordable. This can be less expensive than what you would pay with insurance.

## 2018-06-27 NOTE — ED Provider Notes (Signed)
HPI  SUBJECTIVE:  Kimberly Fields is a 28 y.o. female who presents with a constant dry cough for over a week.  She reports feeling feverish, but has no documented fevers.  She reports postnasal drip, wheezing, chest soreness secondary to cough, shortness of breath, mild dyspnea on exertion.  She also reports sharp intermittent left ear pain.  No change in hearing, otorrhea.  No nasal congestion, rhinorrhea, sinus pain or pressure, sore throat.  She states that she is not able to sleep at night because of the cough.  She has tried Mucinex D, Delsym, Tessalon, and using her albuterol inhaler.  States that she required it twice yesterday, normally uses the inhaler as needed.  She does not have a spacer for this.  She states that the albuterol helps.  Symptoms are worse with exposure to tobacco smoke.  She has a past medical history of allergies, exercise-induced asthma, TMJ, frequent otitis media as a child.  No history of GERD, diabetes, hypertension.  LMP: Now.  Denies the possibility being pregnant.  PMD: None.   Past Medical History:  Diagnosis Date  . Anal fissure   . Anemia   . Asthma   . UTI (lower urinary tract infection)     Past Surgical History:  Procedure Laterality Date  . iud placement  02/03/2016  . TONSILLECTOMY      Family History  Problem Relation Age of Onset  . Migraines Mother   . Asthma Mother   . Cervical cancer Mother   . COPD Maternal Grandmother   . Anxiety disorder Maternal Grandmother   . Coronary artery disease Maternal Grandmother   . Colon cancer Maternal Grandmother   . Coronary artery disease Maternal Grandfather   . Stroke Maternal Grandfather   . Tuberculosis Maternal Grandfather   . Skin cancer Maternal Grandfather   . Bipolar disorder Brother     Social History   Tobacco Use  . Smoking status: Never Smoker  . Smokeless tobacco: Never Used  Substance Use Topics  . Alcohol use: No  . Drug use: No    No current facility-administered  medications for this encounter.   Current Outpatient Medications:  .  famotidine (PEPCID) 20 MG tablet, Take 1 tablet (20 mg total) by mouth 2 (two) times daily., Disp: 30 tablet, Rfl: 0 .  acetaminophen (TYLENOL) 325 MG tablet, Take 650 mg by mouth every 6 (six) hours as needed for mild pain. , Disp: , Rfl:  .  albuterol (PROVENTIL HFA;VENTOLIN HFA) 108 (90 Base) MCG/ACT inhaler, Inhale 1-2 puffs into the lungs every 6 (six) hours as needed for wheezing or shortness of breath., Disp: 1 Inhaler, Rfl: 0 .  chlorpheniramine-HYDROcodone (TUSSIONEX PENNKINETIC ER) 10-8 MG/5ML SUER, Take 5 mLs by mouth every 12 (twelve) hours as needed for cough., Disp: 60 mL, Rfl: 0 .  dimenhyDRINATE (DRAMAMINE) 50 MG tablet, Take 50 mg by mouth every 8 (eight) hours as needed for nausea., Disp: , Rfl:  .  doxycycline (VIBRAMYCIN) 100 MG capsule, Take 1 capsule (100 mg total) by mouth 2 (two) times daily for 7 days., Disp: 14 capsule, Rfl: 0 .  fluticasone (FLONASE) 50 MCG/ACT nasal spray, Place 2 sprays into both nostrils daily., Disp: 16 g, Rfl: 0 .  ibuprofen (ADVIL,MOTRIN) 600 MG tablet, Take 1 tablet (600 mg total) by mouth every 6 (six) hours as needed., Disp: 30 tablet, Rfl: 0 .  metroNIDAZOLE (FLAGYL) 500 MG tablet, Take 1 tablet (500 mg total) by mouth 2 (two) times daily., Disp: 14  tablet, Rfl: 0 .  ondansetron (ZOFRAN ODT) 4 MG disintegrating tablet, Take 1 tablet (4 mg total) by mouth every 8 (eight) hours as needed for nausea or vomiting., Disp: 8 tablet, Rfl: 0 .  predniSONE (DELTASONE) 50 MG tablet, Take 1 tablet (50 mg total) by mouth daily with breakfast for 5 days., Disp: 5 tablet, Rfl: 0 .  promethazine (PHENERGAN) 25 MG tablet, Take 1 tablet (25 mg total) by mouth every 6 (six) hours as needed for nausea or vomiting., Disp: 10 tablet, Rfl: 0 .  Spacer/Aero-Holding Chambers (AEROCHAMBER PLUS) inhaler, Use as instructed, Disp: 1 each, Rfl: 2  Allergies  Allergen Reactions  . Red Dye Swelling      ROS  As noted in HPI.   Physical Exam  BP 106/62 (BP Location: Left Arm)   Pulse 79   Temp 97.8 F (36.6 C) (Oral)   Resp 18   LMP 06/27/2018   SpO2 99%   Constitutional: Well developed, well nourished, no acute distress Eyes:  EOMI, conjunctiva normal bilaterally HENT: Normocephalic, atraumatic,mucus membranes moist.  TMs normal bilaterally.  No sinus tenderness.  Clear nasal congestion.  Normal turbinates.  Positive cobblestoning and postnasal drip.  Normal tonsils.  Mild right TMJ tenderness. Respiratory: Normal inspiratory effort, lungs clear bilaterally, good air movement.  No chest wall tenderness Cardiovascular: Normal rate regular rhythm, no murmurs, rubs, gallops GI: nondistended skin: No rash, skin intact Musculoskeletal: no deformities Neurologic: Alert & oriented x 3, no focal neuro deficits Psychiatric: Speech and behavior appropriate   ED Course   Medications - No data to display  No orders of the defined types were placed in this encounter.   No results found for this or any previous visit (from the past 24 hour(s)). No results found.  ED Clinical Impression  Cough   ED Assessment/Plan  [place] Narcotic database reviewed for this patient, and feel that the risk/benefit ratio today is favorable for proceeding with a prescription for controlled substance.  No opiate prescriptions in 2 years.  Presentation consistent with a viral URI with subsequent cough.  Wonder if she does not have an asthma-like exacerbation/reactive airway disease as she gets better with bronchodilators and worse around pulmonary irritants.  Home with regular albuterol for the next several days, with a spacer, prednisone 50 mg for 5 days, Flonase, wait-and-see prescription of doxycycline and Tussionex.  Will provide a primary care referral list.  Patient denies any right ear pain although she does have mild tenderness over the TMJ.  We will have her observe this.  Discussed MDM,  treatment plan, and plan for follow-up with patient. patient agrees with plan.   Meds ordered this encounter  Medications  . albuterol (PROVENTIL HFA;VENTOLIN HFA) 108 (90 Base) MCG/ACT inhaler    Sig: Inhale 1-2 puffs into the lungs every 6 (six) hours as needed for wheezing or shortness of breath.    Dispense:  1 Inhaler    Refill:  0  . fluticasone (FLONASE) 50 MCG/ACT nasal spray    Sig: Place 2 sprays into both nostrils daily.    Dispense:  16 g    Refill:  0  . predniSONE (DELTASONE) 50 MG tablet    Sig: Take 1 tablet (50 mg total) by mouth daily with breakfast for 5 days.    Dispense:  5 tablet    Refill:  0  . Spacer/Aero-Holding Chambers (AEROCHAMBER PLUS) inhaler    Sig: Use as instructed    Dispense:  1 each  Refill:  2  . doxycycline (VIBRAMYCIN) 100 MG capsule    Sig: Take 1 capsule (100 mg total) by mouth 2 (two) times daily for 7 days.    Dispense:  14 capsule    Refill:  0  . chlorpheniramine-HYDROcodone (TUSSIONEX PENNKINETIC ER) 10-8 MG/5ML SUER    Sig: Take 5 mLs by mouth every 12 (twelve) hours as needed for cough.    Dispense:  60 mL    Refill:  0    *This clinic note was created using Scientist, clinical (histocompatibility and immunogenetics)Dragon dictation software. Therefore, there may be occasional mistakes despite careful proofreading.   ?   Domenick GongMortenson, Maecyn Panning, MD 06/27/18 [phone]

## 2018-10-15 ENCOUNTER — Encounter (HOSPITAL_COMMUNITY): Payer: Self-pay | Admitting: *Deleted

## 2018-10-15 ENCOUNTER — Other Ambulatory Visit: Payer: Self-pay

## 2018-10-15 ENCOUNTER — Emergency Department (HOSPITAL_COMMUNITY)
Admission: EM | Admit: 2018-10-15 | Discharge: 2018-10-15 | Disposition: A | Discharge status: Home / Self Care | Payer: BLUE CROSS/BLUE SHIELD | Attending: Emergency Medicine | Admitting: Emergency Medicine

## 2018-10-15 ENCOUNTER — Emergency Department (HOSPITAL_COMMUNITY): Payer: BLUE CROSS/BLUE SHIELD

## 2018-10-15 DIAGNOSIS — Y939 Activity, unspecified: Secondary | ICD-10-CM | POA: Diagnosis not present

## 2018-10-15 DIAGNOSIS — J45909 Unspecified asthma, uncomplicated: Secondary | ICD-10-CM | POA: Diagnosis not present

## 2018-10-15 DIAGNOSIS — Z79899 Other long term (current) drug therapy: Secondary | ICD-10-CM | POA: Diagnosis not present

## 2018-10-15 DIAGNOSIS — X501XXA Overexertion from prolonged static or awkward postures, initial encounter: Secondary | ICD-10-CM | POA: Insufficient documentation

## 2018-10-15 DIAGNOSIS — Y92096 Garden or yard of other non-institutional residence as the place of occurrence of the external cause: Secondary | ICD-10-CM | POA: Diagnosis not present

## 2018-10-15 DIAGNOSIS — Y999 Unspecified external cause status: Secondary | ICD-10-CM | POA: Diagnosis not present

## 2018-10-15 DIAGNOSIS — S99922A Unspecified injury of left foot, initial encounter: Secondary | ICD-10-CM | POA: Diagnosis present

## 2018-10-15 DIAGNOSIS — S93602A Unspecified sprain of left foot, initial encounter: Secondary | ICD-10-CM | POA: Insufficient documentation

## 2018-10-15 MED ORDER — ACETAMINOPHEN 500 MG PO TABS
1000.0000 mg | ORAL_TABLET | Freq: Once | ORAL | Status: AC
  Administered 2018-10-15: 1000 mg via ORAL
  Filled 2018-10-15: qty 2

## 2018-10-15 NOTE — Discharge Instructions (Addendum)
Use ace wrap for comfort. Use crutches for assistance but try to bear weight as tolerated. You will have more swelling in the first 2 days but then should gradually improve with rest, ice and elevation. If not improving, follow up with orthopedics. Take motrin and tylenol together for best pain relief. Do not exceed more than 3200mg  motrin or 4000mg  tylenol in a 24 hour period. Thank you for allowing me to care for you today. Please return to the emergency department if you have new or worsening symptoms. Take your medications as instructed.

## 2018-10-15 NOTE — ED Notes (Signed)
Patient verbalizes understanding of discharge instructions . Opportunity for questions and answers were provided . Armband removed by staff ,Pt discharged from ED. W/C  offered at D/C  and Declined W/C at D/C and was escorted to lobby by RN.  

## 2018-10-15 NOTE — ED Triage Notes (Signed)
PT reports falling down 1 step last night and now has Lt foot pain.

## 2018-10-15 NOTE — ED Notes (Signed)
Patient transported to X-ray 

## 2018-10-15 NOTE — ED Provider Notes (Signed)
[facility] Note   CSN: [id number] Arrival date & time: 10/15/18  [id number]    History   Chief Complaint Chief Complaint  Patient presents with  . Foot Pain    HPI Kimberly Fields is a 28 y.o. female.     Patient is a 28 year old female with past medical history of asthma, anemia who presents the emergency department for left foot pain.  Patient reports that she was walking outside of her apartment complex yesterday when her foot missed a step and she injured her foot.  She is not sure the exact mechanism of the injury but reports she is now having pain mostly in the top of her left foot and radiating to the bottom of her left foot.  She has not been able to bear weight on it.  She did take Tylenol which was helpful.     Past Medical History:  Diagnosis Date  . Anal fissure   . Anemia   . Asthma   . UTI (lower urinary tract infection)     Patient Active Problem List   Diagnosis Date Noted  . Bleeding internal hemorrhoids 02/11/2016  . Encounter for IUD insertion 02/03/2016  . Iron deficiency anemia 11/06/2015  . Anal fissure 11/12/2014    Past Surgical History:  Procedure Laterality Date  . iud placement  02/03/2016  . TONSILLECTOMY       OB History    Gravida  5   Para  2   Term  2   Preterm  0   AB  3   Living  2     SAB  2   TAB  1   Ectopic  0   Multiple  0   Live Births  2            Home Medications    Prior to Admission medications   Medication Sig Start Date End Date Taking? Authorizing Provider  acetaminophen (TYLENOL) 325 MG tablet Take 650 mg by mouth every 6 (six) hours as needed for mild pain.     [provider]  albuterol (PROVENTIL HFA;VENTOLIN HFA) 108 (90 Base) MCG/ACT inhaler Inhale 1-2 puffs into the lungs every 6 (six) hours as needed for wheezing or shortness of breath. 06/27/18   Domenick GongMortenson, Ashley, MD  chlorpheniramine-HYDROcodone (TUSSIONEX PENNKINETIC ER) 10-8  MG/5ML SUER Take 5 mLs by mouth every 12 (twelve) hours as needed for cough. 06/27/18   Domenick GongMortenson, Ashley, MD  dimenhyDRINATE (DRAMAMINE) 50 MG tablet Take 50 mg by mouth every 8 (eight) hours as needed for nausea.    [provider]  famotidine (PEPCID) 20 MG tablet Take 1 tablet (20 mg total) by mouth 2 (two) times daily. 04/18/18   Khatri, Hina, PA-C  fluticasone (FLONASE) 50 MCG/ACT nasal spray Place 2 sprays into both nostrils daily. 06/27/18   Domenick GongMortenson, Ashley, MD  ibuprofen (ADVIL,MOTRIN) 600 MG tablet Take 1 tablet (600 mg total) by mouth every 6 (six) hours as needed. 02/11/17   Antony MaduraHumes, Thaddeaus Monica, PA-C  metroNIDAZOLE (FLAGYL) 500 MG tablet Take 1 tablet (500 mg total) by mouth 2 (two) times daily. 02/11/18   Nash DimmerProkesova, Julia, MD  ondansetron (ZOFRAN ODT) 4 MG disintegrating tablet Take 1 tablet (4 mg total) by mouth every 8 (eight) hours as needed for nausea or vomiting. 04/18/18   Khatri, Hina, PA-C  promethazine (PHENERGAN) 25 MG tablet Take 1 tablet (25 mg total) by mouth every 6 (six) hours as needed for nausea or vomiting. 07/13/16  Jaynie Crumble, PA-C  Spacer/Aero-Holding Chambers (AEROCHAMBER PLUS) inhaler Use as instructed 06/27/18   Domenick Gong, MD    Family History Family History  Problem Relation Age of Onset  . Migraines Mother   . Asthma Mother   . Cervical cancer Mother   . COPD Maternal Grandmother   . Anxiety disorder Maternal Grandmother   . Coronary artery disease Maternal Grandmother   . Colon cancer Maternal Grandmother   . Coronary artery disease Maternal Grandfather   . Stroke Maternal Grandfather   . Tuberculosis Maternal Grandfather   . Skin cancer Maternal Grandfather   . Bipolar disorder Brother     Social History Social History   Tobacco Use  . Smoking status: Never Smoker  . Smokeless tobacco: Never Used  Substance Use Topics  . Alcohol use: No  . Drug use: No     Allergies   Red dye   Review of Systems Review of Systems   Constitutional: Negative for fever.  Musculoskeletal: Positive for arthralgias and gait problem. Negative for back pain and joint swelling.  Skin: Negative for rash and wound.  Allergic/Immunologic: Negative for immunocompromised state.  Neurological: Negative for dizziness, syncope and light-headedness.  Hematological: Does not bruise/bleed easily.     Physical Exam Updated Vital Signs BP 104/80 (BP Location: Left Arm)   Pulse 75   Temp 98.4 F (36.9 C) (Oral)   Resp 20   Ht 5\' 5"  (1.651 m)   Wt 63.5 kg   LMP 10/08/2018   SpO2 100%   BMI 23.30 kg/m   Physical Exam Vitals signs and nursing note reviewed. Exam conducted with a chaperone present.  Constitutional:      Appearance: Normal appearance. She is not ill-appearing or diaphoretic.  HENT:     Head: Normocephalic and atraumatic.  Eyes:     Conjunctiva/sclera: Conjunctivae normal.  Pulmonary:     Effort: Pulmonary effort is normal.  Musculoskeletal:       Feet:     Comments: Patient has pain with plantar flexion of the left foot.  No bony tenderness over the malleolus or distal fibula.  Achilles tendon is intact and no heel tenderness.  Pulses normal with normal capillary refill  Skin:    General: Skin is warm.     Comments: No obvious signs of injury or trauma  Neurological:     Mental Status: She is alert.  Psychiatric:        Mood and Affect: Mood normal.      ED Treatments / Results  Labs (all labs ordered are listed, but only abnormal results are displayed) Labs Reviewed - No data to display  EKG None  Radiology Dg Ankle Complete Left  Result Date: 10/15/2018 CLINICAL DATA:  Larey Seat down steps last night. Lateral ankle and foot pain. Initial encounter. EXAM: LEFT ANKLE COMPLETE - 3+ VIEW COMPARISON:  None. FINDINGS: There is no evidence of fracture, dislocation, or joint effusion. There is no evidence of arthropathy or other focal bone abnormality. Soft tissues are unremarkable. IMPRESSION: Negative.  Electronically Signed   By: Sebastian Ache M.D.   On: 10/15/2018 07:55   Dg Foot Complete Left  Result Date: 10/15/2018 CLINICAL DATA:  Larey Seat down steps last night. Lateral ankle and foot pain. Initial encounter. EXAM: LEFT FOOT - COMPLETE 3+ VIEW COMPARISON:  None. FINDINGS: There is no evidence of fracture or dislocation. There is no evidence of arthropathy or other focal bone abnormality. Soft tissues are unremarkable. IMPRESSION: Negative. Electronically Signed   By:  Sebastian Ache M.D.   On: 10/15/2018 07:56    Procedures Procedures (including critical care time)  Medications Ordered in ED Medications  acetaminophen (TYLENOL) tablet 1,000 mg (1,000 mg Oral Given 10/15/18 0729)     Initial Impression / Assessment and Plan / ED Course  I have reviewed the triage vital signs and the nursing notes.  Pertinent labs & imaging results that were available during my care of the patient were reviewed by me and considered in my medical decision making (see chart for details).  Clinical Course as of Oct 15 1415  Sun Oct 15, 2018  3244 Patient has left foot injury after mechanical fall.  No signs of compartment syndrome or obvious signs of injury or trauma on exam.  Will obtain foot and ankle x-rays.  Will give Tylenol.   [KM]    Clinical Course User Index [KM] Arlyn Dunning, PA-C       Based on review of vitals, medical screening exam, lab work and/or imaging, there does not appear to be an acute, emergent etiology for the patient's symptoms. Counseled pt on good return precautions and encouraged both PCP and ED follow-up as needed.  Prior to discharge, I also discussed incidental imaging findings with patient in detail and advised appropriate, recommended follow-up in detail.  Clinical Impression: 1. Sprain of left foot, initial encounter     Disposition: Discharge  Prior to providing a prescription for a controlled substance, I independently reviewed the patient's recent prescription  history on the [place] Controlled Substance Reporting System. The patient had no recent or regular prescriptions and was deemed appropriate for a brief, less than 3 day prescription of narcotic for acute analgesia.  This note was prepared with assistance of Conservation officer, historic buildings. Occasional wrong-word or sound-a-like substitutions may have occurred due to the inherent limitations of voice recognition software.   Final Clinical Impressions(s) / ED Diagnoses   Final diagnoses:  Sprain of left foot, initial encounter    ED Discharge Orders    None       Jeral Pinch 10/15/18 1417    Benjiman Core, MD 10/15/18 1501

## 2018-10-15 NOTE — ED Notes (Signed)
Pt returns from radiology. 

## 2018-10-23 ENCOUNTER — Ambulatory Visit
Admission: EM | Admit: 2018-10-23 | Discharge: 2018-10-23 | Disposition: A | Discharge status: Home / Self Care | Payer: BLUE CROSS/BLUE SHIELD

## 2018-10-23 DIAGNOSIS — S93602A Unspecified sprain of left foot, initial encounter: Secondary | ICD-10-CM

## 2018-10-23 DIAGNOSIS — W19XXXD Unspecified fall, subsequent encounter: Secondary | ICD-10-CM

## 2018-10-23 NOTE — ED Triage Notes (Signed)
Pt states tripped and fell down stairs 2wks ago, tx'd at ED for sprained ankle. States still in a lot of pain

## 2018-10-23 NOTE — Discharge Instructions (Addendum)
Schedule to see the Orthopaedist in 1 week.  No weight bearing for 3 days

## 2018-10-24 NOTE — ED Provider Notes (Signed)
[name]    CSN: [id number] Arrival date & time: 10/23/18  1331     History   Chief Complaint Chief Complaint  Patient presents with  . Foot Injury    HPI Kimberly Fields is a 28 y.o. female.   The history is provided by the patient. No language interpreter was used.  Foot Injury  Location:  Ankle Time since incident:  2 weeks Injury: yes   Mechanism of injury: fall   Fall:    Point of impact:  Feet   Entrapped after fall: no   Ankle location:  L ankle Pain details:    Quality:  Aching   Severity:  Moderate   Onset quality:  Gradual   Duration:  2 weeks   Progression:  Worsening Dislocation: no   Foreign body present:  No foreign bodies Relieved by:  Nothing Worsened by:  Nothing Ineffective treatments:  None tried Associated symptoms: decreased ROM   Associated symptoms: no back pain   Pt reports she injured her ankle 2 weeks ago.  Pt reports she had a negative xray.  Pt reports she still has swelling and pain  Past Medical History:  Diagnosis Date  . Anal fissure   . Anemia   . Asthma   . UTI (lower urinary tract infection)     Patient Active Problem List   Diagnosis Date Noted  . Bleeding internal hemorrhoids 02/11/2016  . Encounter for IUD insertion 02/03/2016  . Iron deficiency anemia 11/06/2015  . Anal fissure 11/12/2014    Past Surgical History:  Procedure Laterality Date  . iud placement  02/03/2016  . TONSILLECTOMY      OB History    Gravida  5   Para  2   Term  2   Preterm  0   AB  3   Living  2     SAB  2   TAB  1   Ectopic  0   Multiple  0   Live Births  2            Home Medications    Prior to Admission medications   Medication Sig Start Date End Date Taking? Authorizing Provider  fluticasone (FLONASE) 50 MCG/ACT nasal spray Place 2 sprays into both nostrils daily. 06/27/18   Domenick GongMortenson, Ashley, MD  Spacer/Aero-Holding Chambers (AEROCHAMBER PLUS) inhaler Use as instructed 06/27/18    Domenick GongMortenson, Ashley, MD    Family History Family History  Problem Relation Age of Onset  . Migraines Mother   . Asthma Mother   . Cervical cancer Mother   . COPD Maternal Grandmother   . Anxiety disorder Maternal Grandmother   . Coronary artery disease Maternal Grandmother   . Colon cancer Maternal Grandmother   . Coronary artery disease Maternal Grandfather   . Stroke Maternal Grandfather   . Tuberculosis Maternal Grandfather   . Skin cancer Maternal Grandfather   . Bipolar disorder Brother     Social History Social History   Tobacco Use  . Smoking status: Never Smoker  . Smokeless tobacco: Never Used  Substance Use Topics  . Alcohol use: No  . Drug use: No     Allergies   Red dye   Review of Systems Review of Systems  Musculoskeletal: Negative for back pain.  All other systems reviewed and are negative.    Physical Exam Triage Vital Signs ED Triage Vitals  Enc Vitals Group     BP 10/23/18 1352 (!) 95/52     Pulse  Rate 10/23/18 1352 88     Resp 10/23/18 1353 18     Temp 10/23/18 1352 98.1 F (36.7 C)     Temp Source 10/23/18 1352 Oral     SpO2 10/23/18 1352 99 %     Weight --      Height --      Head Circumference --      Peak Flow --      Pain Score 10/23/18 1353 7     Pain Loc --      Pain Edu? --      Excl. in GC? --    No data found.  Updated Vital Signs BP (!) 95/52 (BP Location: Left Arm)   Pulse 88   Temp 98.1 F (36.7 C) (Oral)   Resp 18   LMP 10/08/2018   SpO2 99%   Visual Acuity Right Eye Distance:   Left Eye Distance:   Bilateral Distance:    Right Eye Near:   Left Eye Near:    Bilateral Near:     Physical Exam Vitals signs and nursing note reviewed.  Constitutional:      Appearance: She is well-developed.  HENT:     Head: Normocephalic.  Neck:     Musculoskeletal: Normal range of motion.  Cardiovascular:     Rate and Rhythm: Normal rate.  Pulmonary:     Effort: Pulmonary effort is normal.  Abdominal:      General: There is no distension.  Musculoskeletal:        General: Swelling and tenderness present.     Comments: Tender left ankle and left foot,  Pain with range of motion, nv and ns itnact  Skin:    General: Skin is warm.  Neurological:     Mental Status: She is alert and oriented to person, place, and time.      UC Treatments / Results  Labs (all labs ordered are listed, but only abnormal results are displayed) Labs Reviewed - No data to display  EKG None  Radiology No results found.  Procedures Procedures (including critical care time)  Medications Ordered in UC Medications - No data to display  Initial Impression / Assessment and Plan / UC Course  I have reviewed the triage vital signs and the nursing notes.  Pertinent labs & imaging results that were available during my care of the patient were reviewed by me and considered in my medical decision making (see chart for details).     MDM  Pt's xray reviewed from ED visit.  Pt placed in an aso.  Pt advised nonweight bearing.  Pt advised to see Orthopaedist if pain persist past one week,  Final Clinical Impressions(s) / UC Diagnoses   Final diagnoses:  Sprain of left foot, initial encounter     Discharge Instructions     Schedule to see the Orthopaedist in 1 week.  No weight bearing for 3 days    ED Prescriptions    None     Controlled Substance Prescriptions [place] Controlled Substance Registry consulted? No   Elson [place], [place] 10/24/18 2058

## 2019-02-05 ENCOUNTER — Telehealth: Payer: Self-pay | Admitting: Women's Health

## 2019-02-05 NOTE — Telephone Encounter (Signed)
Pt states she is having vaginal odor and discharge. Pt requesting to have this checked.

## 2019-02-05 NOTE — Telephone Encounter (Signed)
Unable to reach to schedule. If pt calls back please schedule for a self swab.

## 2019-02-06 ENCOUNTER — Other Ambulatory Visit: Payer: BLUE CROSS/BLUE SHIELD

## 2019-02-06 ENCOUNTER — Other Ambulatory Visit: Payer: Self-pay

## 2019-02-06 ENCOUNTER — Other Ambulatory Visit (INDEPENDENT_AMBULATORY_CARE_PROVIDER_SITE_OTHER): Payer: BLUE CROSS/BLUE SHIELD

## 2019-02-06 DIAGNOSIS — N898 Other specified noninflammatory disorders of vagina: Secondary | ICD-10-CM

## 2019-02-07 ENCOUNTER — Telehealth: Payer: Self-pay | Admitting: *Deleted

## 2019-02-07 ENCOUNTER — Encounter: Payer: Self-pay | Admitting: *Deleted

## 2019-02-07 NOTE — Telephone Encounter (Signed)
Patient called for nuswab results.

## 2019-02-09 ENCOUNTER — Encounter: Payer: Self-pay | Admitting: *Deleted

## 2019-02-09 LAB — NUSWAB VAGINITIS PLUS (VG+)
Atopobium vaginae: HIGH Score — AB
Candida albicans, NAA: NEGATIVE
Candida glabrata, NAA: NEGATIVE
Chlamydia trachomatis, NAA: NEGATIVE
Neisseria gonorrhoeae, NAA: NEGATIVE
Trich vag by NAA: NEGATIVE

## 2019-02-13 ENCOUNTER — Ambulatory Visit: Payer: BLUE CROSS/BLUE SHIELD | Admitting: Obstetrics and Gynecology

## 2019-02-13 ENCOUNTER — Encounter: Payer: Self-pay | Admitting: *Deleted

## 2019-02-13 ENCOUNTER — Telehealth: Payer: Self-pay | Admitting: *Deleted

## 2019-02-13 NOTE — Telephone Encounter (Signed)
-----   Message from Christin Fudge, CNM sent at 02/09/2019  8:03 AM EDT ----- Pt will need clinical exam (swab indeterminate)

## 2019-02-13 NOTE — Telephone Encounter (Signed)
Voice mail not set up @ first # listed and call can't be completed @ 2nd # listed. Pt hasn't read her MyChart message. [place]

## 2019-02-15 NOTE — Telephone Encounter (Signed)
Multiple attempts to reach pt without success. Closing encounter. JSY 

## 2019-02-20 ENCOUNTER — Encounter: Payer: Self-pay | Admitting: Internal Medicine

## 2019-02-20 ENCOUNTER — Other Ambulatory Visit: Payer: Self-pay

## 2019-02-20 ENCOUNTER — Ambulatory Visit (INDEPENDENT_AMBULATORY_CARE_PROVIDER_SITE_OTHER): Payer: BLUE CROSS/BLUE SHIELD | Admitting: Internal Medicine

## 2019-02-20 VITALS — BP 113/71 | HR 86 | Ht 64.0 in

## 2019-02-20 DIAGNOSIS — B9689 Other specified bacterial agents as the cause of diseases classified elsewhere: Secondary | ICD-10-CM | POA: Diagnosis not present

## 2019-02-20 DIAGNOSIS — N898 Other specified noninflammatory disorders of vagina: Secondary | ICD-10-CM

## 2019-02-20 DIAGNOSIS — N760 Acute vaginitis: Secondary | ICD-10-CM | POA: Diagnosis not present

## 2019-02-20 LAB — POCT WET PREP (WET MOUNT): Trichomonas Wet Prep HPF POC: ABSENT

## 2019-02-20 MED ORDER — METRONIDAZOLE 500 MG PO TABS: 500.0000 mg | ORAL_TABLET | Freq: Two times a day (BID) | ORAL | 0 refills | Status: AC

## 2019-02-20 NOTE — Progress Notes (Signed)
   Subjective:    Kimberly Fields - 28 y.o. female MRN [id number]  Date of birth: [birth year]  HPI  Kimberly Fields is a 28 y.o. [phone] female here for concern for vaginal discharge with foul smelling odor. Reports has been occurring for 2 months. Has tried switching body washes at home without relief of symptoms. Denies vaginal itching, abnormal bleeding, pelvic pain, dysuria.      OB History    Gravida  5   Para  2   Term  2   Preterm  0   AB  3   Living  2     SAB  2   TAB  1   Ectopic  0   Multiple  0   Live Births  2           -  reports that she has never smoked. She has never used smokeless tobacco. - Review of Systems: Per HPI. - Past Medical History: Patient Active Problem List   Diagnosis Date Noted  . Bleeding internal hemorrhoids 02/11/2016  . Encounter for IUD insertion 02/03/2016  . Iron deficiency anemia 11/06/2015  . Anal fissure 11/12/2014   - Medications: reviewed and updated   Objective:   Physical Exam BP 113/71 (BP Location: Right Arm, Patient Position: Sitting, Cuff Size: Normal)   Pulse 86   Ht 5\' 4"  (1.626 m)   LMP 02/15/2019 (Exact Date)   BMI 24.03 kg/m  Gen: NAD, alert, cooperative with exam, well-appearing GU/GYN:  External genitalia within normal limits.  Vaginal mucosa pink, moist, normal rugae.  Nonfriable cervix without lesions, no bleeding noted on speculum exam. Small amount of thin, milky white discharge. +Whiff test on exam.  Bimanual exam revealed normal, nongravid uterus.  No cervical motion tenderness. No adnexal masses bilaterally.           Assessment & Plan:   1. Bacterial vaginosis Reviewed wet prep and +clue cells present. Based on history and presence of clue cells, will treat for BV. Discussed measurements to take at home to lower risk of BV.  - metroNIDAZOLE (FLAGYL) 500 MG tablet; Take 1 tablet (500 mg total) by mouth 2 (two) times daily for 7 days.  Dispense: 14 tablet; Refill: 0 - POCT Wet Prep [facility])  2. Vaginal discharge - GC/Chlamydia Probe Amp   Routine preventative health maintenance measures emphasized. Please refer to After Visit Summary for other counseling recommendations.   Return in about 1 year (around 02/20/2020) for annual exam or sooner if needed .  Phill Myron, D.O. OB Fellow  02/20/2019, 10:23 AM

## 2019-02-20 NOTE — Patient Instructions (Signed)

## 2019-02-23 LAB — GC/CHLAMYDIA PROBE AMP
Chlamydia trachomatis, NAA: NEGATIVE
Neisseria Gonorrhoeae by PCR: NEGATIVE

## 2019-03-13 ENCOUNTER — Telehealth: Payer: Self-pay | Admitting: *Deleted

## 2019-03-13 MED ORDER — METRONIDAZOLE 500 MG PO TABS: 500.0000 mg | ORAL_TABLET | Freq: Two times a day (BID) | ORAL | 0 refills | Status: DC

## 2019-03-13 NOTE — Addendum Note (Signed)
Addended by: Derrek Monaco A on: 03/13/2019 04:43 PM   Modules accepted: Orders

## 2019-03-13 NOTE — Telephone Encounter (Signed)
Pt took rx for infection and it went away. Now symptoms are back. Wants to know if she can get the prescription again.

## 2019-03-13 NOTE — Telephone Encounter (Signed)
Pt thinks BV is back, requests flagyl, will refill

## 2019-04-11 ENCOUNTER — Telehealth: Payer: Self-pay | Admitting: *Deleted

## 2019-04-11 NOTE — Telephone Encounter (Signed)
Patient left message that she was treated for an infection last month and still feels like she has it.

## 2019-04-13 NOTE — Telephone Encounter (Signed)
Unable to leave message or reach pt to schedule. Please schedule if pt calls back.

## 2019-05-09 ENCOUNTER — Other Ambulatory Visit: Payer: Self-pay

## 2019-05-09 ENCOUNTER — Other Ambulatory Visit (HOSPITAL_COMMUNITY): Admission: RE | Admit: 2019-05-09 | Payer: BLUE CROSS/BLUE SHIELD | Source: Ambulatory Visit

## 2019-05-09 ENCOUNTER — Ambulatory Visit (INDEPENDENT_AMBULATORY_CARE_PROVIDER_SITE_OTHER): Payer: BLUE CROSS/BLUE SHIELD | Admitting: Adult Health

## 2019-05-09 ENCOUNTER — Encounter: Payer: Self-pay | Admitting: Adult Health

## 2019-05-09 VITALS — BP 110/69 | HR 90 | Ht 63.0 in | Wt 140.0 lb

## 2019-05-09 DIAGNOSIS — N760 Acute vaginitis: Secondary | ICD-10-CM

## 2019-05-09 DIAGNOSIS — N898 Other specified noninflammatory disorders of vagina: Secondary | ICD-10-CM | POA: Diagnosis not present

## 2019-05-09 DIAGNOSIS — B9689 Other specified bacterial agents as the cause of diseases classified elsewhere: Secondary | ICD-10-CM

## 2019-05-09 DIAGNOSIS — Z975 Presence of (intrauterine) contraceptive device: Secondary | ICD-10-CM | POA: Diagnosis not present

## 2019-05-09 LAB — POCT WET PREP (WET MOUNT)
Clue Cells Wet Prep Whiff POC: POSITIVE
Trichomonas Wet Prep HPF POC: ABSENT

## 2019-05-09 MED ORDER — METRONIDAZOLE 0.75 % VA GEL: 1.0000 | Freq: Every day | VAGINAL | 0 refills | Status: DC

## 2019-05-09 NOTE — Patient Instructions (Signed)
Bacterial Vaginosis  Bacterial vaginosis is a vaginal infection that occurs when the normal balance of bacteria in the vagina is disrupted. It results from an overgrowth of certain bacteria. This is the most common vaginal infection among women ages 15-44. Because bacterial vaginosis increases your risk for STIs (sexually transmitted infections), getting treated can help reduce your risk for chlamydia, gonorrhea, herpes, and HIV (human immunodeficiency virus). Treatment is also important for preventing complications in pregnant women, because this condition can cause an early (premature) delivery. What are the causes? This condition is caused by an increase in harmful bacteria that are normally present in small amounts in the vagina. However, the reason that the condition develops is not fully understood. What increases the risk? The following factors may make you more likely to develop this condition:  Having a new sexual partner or multiple sexual partners.  Having unprotected sex.  Douching.  Having an intrauterine device (IUD).  Smoking.  Drug and alcohol abuse.  Taking certain antibiotic medicines.  Being pregnant. You cannot get bacterial vaginosis from toilet seats, bedding, swimming pools, or contact with objects around you. What are the signs or symptoms? Symptoms of this condition include:  Grey or white vaginal discharge. The discharge can also be watery or foamy.  A fish-like odor with discharge, especially after sexual intercourse or during menstruation.  Itching in and around the vagina.  Burning or pain with urination. Some women with bacterial vaginosis have no signs or symptoms. How is this diagnosed? This condition is diagnosed based on:  Your medical history.  A physical exam of the vagina.  Testing a sample of vaginal fluid under a microscope to look for a large amount of bad bacteria or abnormal cells. Your health care provider may use a cotton swab or  a small wooden spatula to collect the sample. How is this treated? This condition is treated with antibiotics. These may be given as a pill, a vaginal cream, or a medicine that is put into the vagina (suppository). If the condition comes back after treatment, a second round of antibiotics may be needed. Follow these instructions at home: Medicines  Take over-the-counter and prescription medicines only as told by your health care provider.  Take or use your antibiotic as told by your health care provider. Do not stop taking or using the antibiotic even if you start to feel better. General instructions  If you have a female sexual partner, tell her that you have a vaginal infection. She should see her health care provider and be treated if she has symptoms. If you have a female sexual partner, he does not need treatment.  During treatment: ? Avoid sexual activity until you finish treatment. ? Do not douche. ? Avoid alcohol as directed by your health care provider. ? Avoid breastfeeding as directed by your health care provider.  Drink enough water and fluids to keep your urine clear or pale yellow.  Keep the area around your vagina and rectum clean. ? Wash the area daily with warm water. ? Wipe yourself from front to back after using the toilet.  Keep all follow-up visits as told by your health care provider. This is important. How is this prevented?  Do not douche.  Wash the outside of your vagina with warm water only.  Use protection when having sex. This includes latex condoms and dental dams.  Limit how many sexual partners you have. To help prevent bacterial vaginosis, it is best to have sex with just one partner (  monogamous).  Make sure you and your sexual partner are tested for STIs.  Wear cotton or cotton-lined underwear.  Avoid wearing tight pants and pantyhose, especially during summer.  Limit the amount of alcohol that you drink.  Do not use any products that contain  nicotine or tobacco, such as cigarettes and e-cigarettes. If you need help quitting, ask your health care provider.  Do not use illegal drugs. Where to find more information  Centers for Disease Control and Prevention: www.cdc.gov/std  American Sexual Health Association (ASHA): www.ashastd.org  U.S. Department of Health and Human Services, Office on Women's Health: www.womenshealth.gov/ or https://www.womenshealth.gov/a-z-topics/bacterial-vaginosis Contact a health care provider if:  Your symptoms do not improve, even after treatment.  You have more discharge or pain when urinating.  You have a fever.  You have pain in your abdomen.  You have pain during sex.  You have vaginal bleeding between periods. Summary  Bacterial vaginosis is a vaginal infection that occurs when the normal balance of bacteria in the vagina is disrupted.  Because bacterial vaginosis increases your risk for STIs (sexually transmitted infections), getting treated can help reduce your risk for chlamydia, gonorrhea, herpes, and HIV (human immunodeficiency virus). Treatment is also important for preventing complications in pregnant women, because the condition can cause an early (premature) delivery.  This condition is treated with antibiotic medicines. These may be given as a pill, a vaginal cream, or a medicine that is put into the vagina (suppository). This information is not intended to replace advice given to you by your health care provider. Make sure you discuss any questions you have with your health care provider. Document Released: 05/24/2005 Document Revised: 05/06/2017 Document Reviewed: 02/07/2016 Elsevier Patient Education  2020 Elsevier Inc.  

## 2019-05-09 NOTE — Progress Notes (Signed)
  Subjective:     Patient ID: Kimberly Fields, female   DOB: August 29, 1990, 28 y.o.   MRN: [id number]  HPI Kimberly Fields is a 28 year old white female, single, [id number], in complaining of vaginal odor, denies any itching or burning. Was treated for BV twice in September.    Review of Systems +vaginal odor Denies any itching or burning Feels a little dry with sex at times Same partner for 6 months   Reviewed past medical,surgical, social and family history. Reviewed medications and allergies.     Objective:   Physical Exam BP 110/69 (BP Location: Left Arm, Patient Position: Sitting, Cuff Size: Normal)   Pulse 90   Ht 5\' 3"  (1.6 m)   Wt 140 lb (63.5 kg)   LMP 04/12/2019   BMI 24.80 kg/m   Skin warm and dry. Lungs: clear to ausculation bilaterally. Cardiovascular: regular rate and rhythm. Pelvic: external genitalia is normal in appearance no lesions, vagina: white discharge with odor,urethra has no lesions or masses noted, cervix:smooth and bulbous, +IUD strings at os.  Wet prep: + for clue cells and +WBCs. CV swab obtained.  Exam performed by Weyman Croon FNP student under my supervision.     Assessment:     .1. Vaginal discharge CV swab sent for GC/CHL and trich   2. Bacterial vaginosis Will Rx Metrogel for 5 days, and wash and save applicator and can use again if odor returns  Meds ordered this encounter  Medications  . metroNIDAZOLE (METROGEL VAGINAL) 0.75 % vaginal gel    Sig: Place 1 Applicatorful vaginally at bedtime.    Dispense:  70 g    Refill:  0    Order Specific Question:   Supervising Provider    Answer:   Tania Ade H [[id number]]  Review handout on BV   3. Vaginal odor  4. IUD (intrauterine device) in place    Plan:     Return in 5 weeks for pap and physical with me

## 2019-05-11 LAB — CERVICOVAGINAL ANCILLARY ONLY
Chlamydia: NEGATIVE
Comment: NEGATIVE
Comment: NEGATIVE
Comment: NORMAL
Neisseria Gonorrhea: NEGATIVE
Trichomonas: NEGATIVE

## 2019-06-14 ENCOUNTER — Other Ambulatory Visit: Payer: BLUE CROSS/BLUE SHIELD | Admitting: Adult Health

## 2019-06-24 ENCOUNTER — Ambulatory Visit (HOSPITAL_COMMUNITY)
Admission: EM | Admit: 2019-06-24 | Discharge: 2019-06-24 | Disposition: A | Discharge status: Home / Self Care | Payer: BC Managed Care – PPO

## 2019-06-24 ENCOUNTER — Encounter: Payer: Self-pay | Admitting: Emergency Medicine

## 2019-06-24 ENCOUNTER — Other Ambulatory Visit: Payer: Self-pay

## 2019-06-24 ENCOUNTER — Ambulatory Visit
Admission: EM | Admit: 2019-06-24 | Discharge: 2019-06-24 | Disposition: A | Discharge status: Home / Self Care | Payer: Managed Care, Other (non HMO)

## 2019-06-24 DIAGNOSIS — Z20822 Contact with and (suspected) exposure to covid-19: Secondary | ICD-10-CM | POA: Diagnosis not present

## 2019-06-24 DIAGNOSIS — R0609 Other forms of dyspnea: Secondary | ICD-10-CM

## 2019-06-24 DIAGNOSIS — R0600 Dyspnea, unspecified: Secondary | ICD-10-CM | POA: Diagnosis not present

## 2019-06-24 MED ORDER — ALBUTEROL SULFATE HFA 108 (90 BASE) MCG/ACT IN AERS
2.0000 | INHALATION_SPRAY | Freq: Once | RESPIRATORY_TRACT | Status: AC
  Administered 2019-06-24: 10:00:00 2 via RESPIRATORY_TRACT

## 2019-06-24 MED ORDER — PREDNISONE 20 MG PO TABS: 20.0000 mg | ORAL_TABLET | Freq: Every day | ORAL | 0 refills | Status: AC

## 2019-06-24 NOTE — ED Provider Notes (Signed)
Kimberly Fields    CSN: [id number] Arrival date & time: 06/24/19  0944      History   Chief Complaint Chief Complaint  Patient presents with  . Shortness of Breath    HPI Kimberly Fields is a 29 y.o. female with history of asthma presenting for 3-day course of sore throat, headache, fatigue, chest pressure/tightness, dyspnea on exertion.  States she had some wheezing the other day.  Patient states she used to have an albuterol inhaler at home, though could not locate it.  Has not tried anything for symptoms.  Patient states headache was generalized, self-limited on day 2 of illness.  Overall, patient states this feels similar to her asthma, though not sure if something else is going on.  Denies history of DVT, lower extremity edema, prolonged stasis, travel, fracture, surgery.  Denies OCP or tobacco use.  Patient has had a mild dry cough accompanied with this.  Of note, patient states a coworker of hers tested positive last week, though she believes she did not have close contact.  Patient denies lightheadedness, palpitations, dizziness, nausea, vomiting, diarrhea, abdominal pain, weakness.  Past Medical History:  Diagnosis Date  . Anal fissure   . Anemia   . Asthma   . UTI (lower urinary tract infection)     Patient Active Problem List   Diagnosis Date Noted  . Vaginal odor 05/09/2019  . Bacterial vaginosis 05/09/2019  . Vaginal discharge 05/09/2019  . IUD (intrauterine device) in place 05/09/2019  . Bleeding internal hemorrhoids 02/11/2016  . Encounter for IUD insertion 02/03/2016  . Iron deficiency anemia 11/06/2015  . Anal fissure 11/12/2014    Past Surgical History:  Procedure Laterality Date  . iud placement  02/03/2016  . TONSILLECTOMY      OB History    Gravida  5   Para  2   Term  2   Preterm  0   AB  3   Living  2     SAB  2   TAB  1   Ectopic  0   Multiple  0   Live Births  2            Home Medications    Prior to  Admission medications   Medication Sig Start Date End Date Taking? Authorizing Provider  cetirizine (ZYRTEC) 10 MG tablet Take 10 mg by mouth daily.   Yes [provider]  albuterol (VENTOLIN HFA) 108 (90 Base) MCG/ACT inhaler Inhale into the lungs every 6 (six) hours as needed for wheezing or shortness of breath.    [provider]  fluticasone (FLONASE) 50 MCG/ACT nasal spray Place 2 sprays into both nostrils daily. Patient not taking: Reported on 02/06/2019 06/27/18   Domenick Gong, MD  predniSONE (DELTASONE) 20 MG tablet Take 1 tablet (20 mg total) by mouth daily for 7 days. 06/24/19 07/01/19  Hall-Potvin, [place], PA-C    Family History Family History  Problem Relation Age of Onset  . Migraines Mother   . Asthma Mother   . Cervical cancer Mother   . COPD Maternal Grandmother   . Anxiety disorder Maternal Grandmother   . Coronary artery disease Maternal Grandmother   . Colon cancer Maternal Grandmother   . Coronary artery disease Maternal Grandfather   . Stroke Maternal Grandfather   . Tuberculosis Maternal Grandfather   . Skin cancer Maternal Grandfather   . Bipolar disorder Brother     Social History Social History   Tobacco Use  . Smoking  status: Never Smoker  . Smokeless tobacco: Never Used  Substance Use Topics  . Alcohol use: No  . Drug use: No     Allergies   Red dye   Review of Systems As per HPI   Physical Exam Triage Vital Signs ED Triage Vitals  Enc Vitals Group     BP 06/24/19 0953 103/70     Pulse Rate 06/24/19 0953 86     Resp 06/24/19 0953 16     Temp 06/24/19 0953 98.1 F (36.7 C)     Temp Source 06/24/19 0953 Temporal     SpO2 06/24/19 0953 98 %     Weight --      Height --      Head Circumference --      Peak Flow --      Pain Score 06/24/19 0954 4     Pain Loc --      Pain Edu? --      Excl. in GC? --    No data found.  Updated Vital Signs BP 103/70 (BP Location: Left Arm)   Pulse 86   Temp 98.1 F (36.7  C) (Temporal)   Resp 16   LMP 06/20/2019   SpO2 98%   Visual Acuity Right Eye Distance:   Left Eye Distance:   Bilateral Distance:    Right Eye Near:   Left Eye Near:    Bilateral Near:     Physical Exam Constitutional:      General: She is not in acute distress.    Appearance: She is well-developed. She is obese. She is not ill-appearing or diaphoretic.  HENT:     Head: Normocephalic and atraumatic.     Mouth/Throat:     Mouth: Mucous membranes are moist.     Pharynx: Oropharynx is clear. No oropharyngeal exudate or posterior oropharyngeal erythema.  Eyes:     General: No scleral icterus.    Conjunctiva/sclera: Conjunctivae normal.     Pupils: Pupils are equal, round, and reactive to light.  Neck:     Vascular: No JVD.     Trachea: No tracheal deviation.     Comments: Trachea midline, negative JVD Cardiovascular:     Rate and Rhythm: Normal rate and regular rhythm.     Heart sounds: No murmur. No gallop.   Pulmonary:     Effort: Pulmonary effort is normal. No accessory muscle usage or respiratory distress.     Breath sounds: No stridor. No decreased breath sounds, wheezing, rhonchi or rales.     Comments: S/P albuterol: CTAB w/ good air flow bilaterally Chest:     Chest wall: No tenderness, crepitus or edema.  Musculoskeletal:     Cervical back: Neck supple. No tenderness.  Lymphadenopathy:     Cervical: No cervical adenopathy.  Skin:    General: Skin is warm.     Capillary Refill: Capillary refill takes less than 2 seconds.     Coloration: Skin is not cyanotic, jaundiced or pale.     Findings: No rash.  Neurological:     General: No focal deficit present.     Mental Status: She is alert and oriented to person, place, and time.      UC Treatments / Results  Labs (all labs ordered are listed, but only abnormal results are displayed) Labs Reviewed  NOVEL CORONAVIRUS, NAA    EKG    Radiology No results found.  Procedures Procedures (including  critical Fields time)  Medications Ordered in UC Medications  albuterol (VENTOLIN HFA) 108 (90 Base) MCG/ACT inhaler 2 puff (2 puffs Inhalation Given 06/24/19 1010)    Initial Impression / Assessment and Plan / UC Course  I have reviewed the triage vital signs and the nursing notes.  Pertinent labs & imaging results that were available during my Fields of the patient were reviewed by me and considered in my medical decision making (see chart for details).     Patient afebrile, nontoxic, with SpO2 98%.  Symptoms could be related to seasonal allergies, asthma given patient's history, though cardiopulmonary exam today does not reflect acute asthma exacerbation.  Low concern for ACS or life-threatening acute process.  Concern for Covid given patient's line of work/possible exposure: PCR pending.  Patient to quarantine until results are back.  We will continue supportive management: Patient given albuterol inhaler with spacing device in office-took 2 pumps adequately with minimal relief of chest tightness.  Repeat lung exam reassuring w/ SpO2 98%, HR 88bpm.  We will add prednisone as adjuvant.  Return precautions discussed, patient verbalized understanding and is agreeable to plan. Final Clinical Impressions(s) / UC Diagnoses   Final diagnoses:  Suspected COVID-19 virus infection  Exposure to COVID-19 virus  Dyspnea on exertion     Discharge Instructions     Your COVID test is pending - it is important to quarantine / isolate at home until your results are back. If you test positive and would like further evaluation for persistent or worsening symptoms, you may schedule an E-visit or virtual (video) visit throughout the [facility] app or website.  PLEASE NOTE: If you develop severe chest pain or shortness of breath please go to the ER or call 9-1-1 for further evaluation --> DO NOT schedule electronic or virtual visits for this. Please call our office for further guidance /  recommendations as needed.    ED Prescriptions    Medication Sig Dispense Auth. Provider   predniSONE (DELTASONE) 20 MG tablet Take 1 tablet (20 mg total) by mouth daily for 7 days. 7 tablet Hall-Potvin, [place], PA-C     PDMP not reviewed this encounter.   Hall-Potvin, [place], [place] 06/24/19 1035

## 2019-06-24 NOTE — Discharge Instructions (Signed)
Your COVID test is pending - it is important to quarantine / isolate at home until your results are back. °If you test positive and would like further evaluation for persistent or worsening symptoms, you may schedule an E-visit or virtual (video) visit throughout the [facility] MyChart app or website. ° °PLEASE NOTE: If you develop severe chest pain or shortness of breath please go to the ER or call 9-1-1 for further evaluation --> DO NOT schedule electronic or virtual visits for this. °Please call our office for further guidance / recommendations as needed. °

## 2019-06-24 NOTE — ED Triage Notes (Addendum)
Pt presents to [facility] for assessment of 3 days of sore throat, worsening chest pressure, headache, shortness of breath and severe fatigue when ambulating, and wheezing.  Hx of asthma.  Pt states she has a rescue inhaler at home, but has not tried it this week.

## 2019-06-24 NOTE — ED Notes (Signed)
Patient able to ambulate independently  

## 2019-06-25 LAB — NOVEL CORONAVIRUS, NAA: SARS-CoV-2, NAA: NOT DETECTED

## 2019-08-06 ENCOUNTER — Telehealth: Payer: Self-pay | Admitting: *Deleted

## 2019-08-06 NOTE — Telephone Encounter (Signed)
Patient left message wanting Rx for infection. She states provider had talked about another medication.

## 2019-08-06 NOTE — Telephone Encounter (Signed)
No VM  

## 2019-08-15 ENCOUNTER — Telehealth: Payer: Self-pay | Admitting: *Deleted

## 2019-08-15 NOTE — Telephone Encounter (Signed)
No VM  

## 2019-08-15 NOTE — Telephone Encounter (Signed)
Pt is returning your call from last week. Thanks!!

## 2019-08-23 ENCOUNTER — Ambulatory Visit (INDEPENDENT_AMBULATORY_CARE_PROVIDER_SITE_OTHER): Payer: BLUE CROSS/BLUE SHIELD | Admitting: Advanced Practice Midwife

## 2019-08-23 ENCOUNTER — Other Ambulatory Visit: Payer: Self-pay

## 2019-08-23 ENCOUNTER — Encounter: Payer: Self-pay | Admitting: Advanced Practice Midwife

## 2019-08-23 VITALS — BP 109/70 | HR 77 | Ht 64.0 in | Wt 139.0 lb

## 2019-08-23 DIAGNOSIS — N898 Other specified noninflammatory disorders of vagina: Secondary | ICD-10-CM

## 2019-08-23 MED ORDER — METRONIDAZOLE 0.75 % VA GEL: VAGINAL | 2 refills | Status: DC

## 2019-08-23 NOTE — Progress Notes (Signed)
[facility] Visit  Patient name: Kimberly Fields MRN [id number]  Date of birth: [birth year]  CC & HPI:  Kimberly Fields is a 29 y.o.  female presenting today for vaginal odor  Has been treated for documented BV 4-5 times in the last 6 months. Today she c/o fishy odor.   Pertinent History Reviewed:  Medical & Surgical Hx:   Past Medical History:  Diagnosis Date  . Anal fissure   . Anemia   . Asthma   . UTI (lower urinary tract infection)    Past Surgical History:  Procedure Laterality Date  . iud placement  02/03/2016  . TONSILLECTOMY     Family History  Problem Relation Age of Onset  . Migraines Mother   . Asthma Mother   . Cervical cancer Mother   . COPD Maternal Grandmother   . Anxiety disorder Maternal Grandmother   . Coronary artery disease Maternal Grandmother   . Colon cancer Maternal Grandmother   . Coronary artery disease Maternal Grandfather   . Stroke Maternal Grandfather   . Tuberculosis Maternal Grandfather   . Skin cancer Maternal Grandfather   . Bipolar disorder Brother     Current Outpatient Medications:  .  albuterol (VENTOLIN HFA) 108 (90 Base) MCG/ACT inhaler, Inhale into the lungs every 6 (six) hours as needed for wheezing or shortness of breath., Disp: , Rfl:  .  cetirizine (ZYRTEC) 10 MG tablet, Take 10 mg by mouth daily., Disp: , Rfl:  Social History: Reviewed -  reports that she has never smoked. She has never used smokeless tobacco.  Review of Systems:   Constitutional: Negative for fever and chills Eyes: Negative for visual disturbances Respiratory: Negative for shortness of breath, dyspnea Cardiovascular: Negative for chest pain or palpitations  Gastrointestinal: Negative for vomiting, diarrhea and constipation; no abdominal pain Genitourinary: Negative for dysuria and urgency, vaginal irritation or itching Musculoskeletal: Negative for back pain, joint pain, myalgias  Neurological: Negative for dizziness and  headaches    Objective Findings:    Physical Examination: There were no vitals filed for this visit. General appearance - well appearing, and in no distress Mental status - alert, oriented to person, place, and time Chest:  Normal respiratory effort Heart - normal rate and regular rhythm Abdomen:  Soft, nontender Pelvic: discharge appears normal,  Sl + whiff and few clues Musculoskeletal:  Normal range of motion without pain Extremities:  No edema    No results found for this or any previous visit (from the past 24 hour(s)).    Assessment & Plan:  A:   Probable BV, recurrant P:  NuSwab to confirm.  If +, metrogel 2x/week x4 months If Neg, Probiotics instead   No follow-ups on file.  Jacklyn Shell CNM 08/23/2019 4:08 PM

## 2019-08-27 LAB — NUSWAB VAGINITIS PLUS (VG+)
Candida albicans, NAA: NEGATIVE
Candida glabrata, NAA: NEGATIVE
Chlamydia trachomatis, NAA: NEGATIVE
Neisseria gonorrhoeae, NAA: NEGATIVE
Trich vag by NAA: NEGATIVE

## 2019-11-17 ENCOUNTER — Ambulatory Visit
Admission: EM | Admit: 2019-11-17 | Discharge: 2019-11-17 | Disposition: A | Discharge status: Home / Self Care | Payer: BLUE CROSS/BLUE SHIELD

## 2019-11-17 DIAGNOSIS — M545 Low back pain, unspecified: Secondary | ICD-10-CM

## 2019-11-17 MED ORDER — TIZANIDINE HCL 2 MG PO TABS: 2.0000 mg | ORAL_TABLET | Freq: Three times a day (TID) | ORAL | 0 refills | Status: DC | PRN

## 2019-11-17 MED ORDER — KETOROLAC TROMETHAMINE 15 MG/ML IJ SOLN
15.0000 mg | Freq: Once | INTRAMUSCULAR | Status: AC
  Administered 2019-11-17: 15 mg via INTRAMUSCULAR

## 2019-11-17 MED ORDER — MELOXICAM 7.5 MG PO TABS: 7.5000 mg | ORAL_TABLET | Freq: Every day | ORAL | 0 refills | Status: DC

## 2019-11-17 NOTE — Discharge Instructions (Signed)
Toradol injection in office today. Start Mobic. Do not take ibuprofen (motrin/advil)/ naproxen (aleve) while on mobic. Tizanidine as needed, this can make you drowsy, so do not take if you are going to drive, operate heavy machinery, or make important decisions. Ice/heat compresses as needed. This can take up to 3-4 weeks to completely resolve, but you should be feeling better each week. Follow up with PCP/orthopedics if symptoms worsen, changes for reevaluation. If experience numbness/tingling of the inner thighs, loss of bladder or bowel control, go to the emergency department for evaluation.

## 2019-11-17 NOTE — ED Triage Notes (Signed)
Patient is here with lower right side back pain that started x 3 days ago. 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

## 2019-11-17 NOTE — ED Provider Notes (Signed)
[name]    CSN: [id number] Arrival date & time: 11/17/19  1117      History   Chief Complaint Chief Complaint  Patient presents with  . Back Pain    HPI Kimberly Fields is a 29 y.o. female.   29 year old female comes in for 3-day history of right lower back pain.  Denies injury/trauma.  Pain is better standing, worse sitting straight.  Denies hip/leg pain.  Pain can occasionally radiate up the back.  Denies saddle anesthesia, loss of bladder or bowel control.  Works at [employer], and has lifting and climbing frequently.  Tylenol without relief.     Past Medical History:  Diagnosis Date  . Anal fissure   . Anemia   . Asthma   . UTI (lower urinary tract infection)     Patient Active Problem List   Diagnosis Date Noted  . Vaginal odor 05/09/2019  . Bacterial vaginosis 05/09/2019  . Vaginal discharge 05/09/2019  . IUD (intrauterine device) in place 05/09/2019  . Bleeding internal hemorrhoids 02/11/2016  . Encounter for IUD insertion 02/03/2016  . Iron deficiency anemia 11/06/2015  . Anal fissure 11/12/2014    Past Surgical History:  Procedure Laterality Date  . iud placement  02/03/2016  . TONSILLECTOMY      OB History    Gravida  5   Para  2   Term  2   Preterm  0   AB  3   Living  2     SAB  2   TAB  1   Ectopic  0   Multiple  0   Live Births  2            Home Medications    Prior to Admission medications   Medication Sig Start Date End Date Taking? Authorizing Provider  doxycycline (ORACEA) 40 MG capsule Take 40 mg by mouth daily at 6 (six) AM.   Yes [provider]  albuterol (VENTOLIN HFA) 108 (90 Base) MCG/ACT inhaler Inhale into the lungs every 6 (six) hours as needed for wheezing or shortness of breath.    [provider]  cetirizine (ZYRTEC) 10 MG tablet Take 10 mg by mouth daily.    [provider]  levonorgestrel (MIRENA) 20 MCG/24HR IUD 1 each by Intrauterine route once.     [provider]  meloxicam (MOBIC) 7.5 MG tablet Take 1 tablet (7.5 mg total) by mouth daily. 11/17/19   Cathie Hoops, Laiyah Exline V, PA-C  tiZANidine (ZANAFLEX) 2 MG tablet Take 1 tablet (2 mg total) by mouth every 8 (eight) hours as needed for muscle spasms. 11/17/19   Belinda Fisher, PA-C    Family History Family History  Problem Relation Age of Onset  . Migraines Mother   . Asthma Mother   . Cervical cancer Mother   . COPD Maternal Grandmother   . Anxiety disorder Maternal Grandmother   . Coronary artery disease Maternal Grandmother   . Colon cancer Maternal Grandmother   . Coronary artery disease Maternal Grandfather   . Stroke Maternal Grandfather   . Tuberculosis Maternal Grandfather   . Skin cancer Maternal Grandfather   . Bipolar disorder Brother     Social History Social History   Tobacco Use  . Smoking status: Never Smoker  . Smokeless tobacco: Never Used  Vaping Use  . Vaping Use: Never used  Substance Use Topics  . Alcohol use: No  . Drug use: No     Allergies   Red  dye   Review of Systems Review of Systems  Reason unable to perform ROS: See HPI as above.     Physical Exam Triage Vital Signs ED Triage Vitals  Enc Vitals Group     BP 11/17/19 1241 104/74     Pulse Rate 11/17/19 1241 76     Resp 11/17/19 1241 16     Temp 11/17/19 1241 98.5 F (36.9 C)     Temp Source 11/17/19 1241 Oral     SpO2 11/17/19 1241 98 %     Weight --      Height --      Head Circumference --      Peak Flow --      Pain Score 11/17/19 1257 8     Pain Loc --      Pain Edu? --      Excl. in GC? --    No data found.  Updated Vital Signs BP 104/74 (BP Location: Left Arm)   Pulse 76   Temp 98.5 F (36.9 C) (Oral)   Resp 16   LMP 11/17/2019   SpO2 98%   Physical Exam Constitutional:      General: She is not in acute distress.    Appearance: She is well-developed. She is not diaphoretic.  HENT:     Head: Normocephalic and atraumatic.  Eyes:     Conjunctiva/sclera:  Conjunctivae normal.     Pupils: Pupils are equal, round, and reactive to light.  Cardiovascular:     Rate and Rhythm: Normal rate and regular rhythm.     Heart sounds: Normal heart sounds. No murmur heard.  No friction rub. No gallop.   Pulmonary:     Effort: Pulmonary effort is normal. No accessory muscle usage or respiratory distress.     Breath sounds: Normal breath sounds. No stridor. No decreased breath sounds, wheezing, rhonchi or rales.  Musculoskeletal:     Comments: No tenderness on palpation of the spinous processes. Tenderness to palpation of bilateral lumbar region. No tenderness to palpation of the hips. Decreased ROM of back due to pain. Full ROM of hips. Strength normal and equal bilaterally. Sensation intact and equal bilaterally. Negative straight leg raise.  Skin:    General: Skin is warm and dry.  Neurological:     Mental Status: She is alert and oriented to person, place, and time.      UC Treatments / Results  Labs (all labs ordered are listed, but only abnormal results are displayed) Labs Reviewed - No data to display  EKG   Radiology No results found.  Procedures Procedures (including critical care time)  Medications Ordered in UC Medications  ketorolac (TORADOL) 15 MG/ML injection 15 mg (15 mg Intramuscular Given 11/17/19 1405)    Initial Impression / Assessment and Plan / UC Course  I have reviewed the triage vital signs and the nursing notes.  Pertinent labs & imaging results that were available during my care of the patient were reviewed by me and considered in my medical decision making (see chart for details).    Toradol injection in office today. NSAID as directed. Muscle relaxant as needed. Ice/heat compresses. Expected course of healing discussed. Return precautions given.   Final Clinical Impressions(s) / UC Diagnoses   Final diagnoses:  Acute bilateral low back pain without sciatica    ED Prescriptions    Medication Sig Dispense  Auth. Provider   meloxicam (MOBIC) 7.5 MG tablet Take 1 tablet (7.5 mg total) by mouth daily. 15  tablet Jacoba Cherney V, PA-C   tiZANidine (ZANAFLEX) 2 MG tablet Take 1 tablet (2 mg total) by mouth every 8 (eight) hours as needed for muscle spasms. 15 tablet Ok Edwards, PA-C     PDMP not reviewed this encounter.   Ok Edwards, PA-C 11/17/19 1527

## 2020-03-07 ENCOUNTER — Ambulatory Visit
Admission: EM | Admit: 2020-03-07 | Discharge: 2020-03-07 | Disposition: A | Discharge status: Home / Self Care | Payer: Managed Care, Other (non HMO) | Attending: Emergency Medicine | Admitting: Emergency Medicine

## 2020-03-07 ENCOUNTER — Other Ambulatory Visit: Payer: Self-pay

## 2020-03-07 ENCOUNTER — Encounter: Payer: Self-pay | Admitting: Emergency Medicine

## 2020-03-07 DIAGNOSIS — J069 Acute upper respiratory infection, unspecified: Secondary | ICD-10-CM

## 2020-03-07 DIAGNOSIS — Z1152 Encounter for screening for COVID-19: Secondary | ICD-10-CM

## 2020-03-07 MED ORDER — BENZONATATE 200 MG PO CAPS: 200.0000 mg | ORAL_CAPSULE | Freq: Three times a day (TID) | ORAL | 0 refills | Status: AC | PRN

## 2020-03-07 MED ORDER — FLUTICASONE PROPIONATE 50 MCG/ACT NA SUSP: 1.0000 | Freq: Every day | NASAL | 0 refills | Status: DC

## 2020-03-07 NOTE — ED Triage Notes (Signed)
Pt here with cough, nasal congestion and body aches x 3 days

## 2020-03-07 NOTE — Discharge Instructions (Signed)
Covid test pending, monitor my chart for results Ibuprofen and Tylenol as needed for fever body aches and headache Rest and fluids May use over-the-counter Imodium for diarrhea as needed Continue Zyrtec, add in Flonase nasal spray for congestion Tessalon/benzonatate every 8 hours as needed for cough Follow-up if not improving or worsening

## 2020-03-07 NOTE — ED Provider Notes (Signed)
[name]    CSN: [id number] Arrival date & time: 03/07/20  0841      History   Chief Complaint Chief Complaint  Patient presents with  . Nasal Congestion  . Cough  . Generalized Body Aches    HPI Kimberly Fields is a 29 y.o. female presenting today for evaluation of URI symptoms.  Patient reports that her symptoms began Wednesday approximately 3 days ago.  Reports cough congestion and sore throat.  She has also had low-grade fevers and feeling achy with low appetite.  Denies nausea or vomiting, but has had diarrhea.  Denies abdominal pain.  Using Sudafed Excedrin and Zyrtec.  Children with similar symptoms but no known Covid exposures.  HPI  Past Medical History:  Diagnosis Date  . Anal fissure   . Anemia   . Asthma   . UTI (lower urinary tract infection)     Patient Active Problem List   Diagnosis Date Noted  . Vaginal odor 05/09/2019  . Bacterial vaginosis 05/09/2019  . Vaginal discharge 05/09/2019  . IUD (intrauterine device) in place 05/09/2019  . Bleeding internal hemorrhoids 02/11/2016  . Encounter for IUD insertion 02/03/2016  . Iron deficiency anemia 11/06/2015  . Anal fissure 11/12/2014    Past Surgical History:  Procedure Laterality Date  . iud placement  02/03/2016  . TONSILLECTOMY      OB History    Gravida  5   Para  2   Term  2   Preterm  0   AB  3   Living  2     SAB  2   TAB  1   Ectopic  0   Multiple  0   Live Births  2            Home Medications    Prior to Admission medications   Medication Sig Start Date End Date Taking? Authorizing Provider  albuterol (VENTOLIN HFA) 108 (90 Base) MCG/ACT inhaler Inhale into the lungs every 6 (six) hours as needed for wheezing or shortness of breath.    [provider]  benzonatate (TESSALON) 200 MG capsule Take 1 capsule (200 mg total) by mouth 3 (three) times daily as needed for up to 7 days for cough. 03/07/20 03/14/20  Trinka Keshishyan C, PA-C    cetirizine (ZYRTEC) 10 MG tablet Take 10 mg by mouth daily.    [provider]  doxycycline (ORACEA) 40 MG capsule Take 40 mg by mouth daily at 6 (six) AM. Patient not taking: Reported on 03/07/2020    [provider]  fluticasone (FLONASE) 50 MCG/ACT nasal spray Place 1-2 sprays into both nostrils daily. 03/07/20   Elynore Dolinski C, PA-C  levonorgestrel (MIRENA) 20 MCG/24HR IUD 1 each by Intrauterine route once.    [provider]  meloxicam (MOBIC) 7.5 MG tablet Take 1 tablet (7.5 mg total) by mouth daily. Patient not taking: Reported on 03/07/2020 11/17/19   Belinda Fisher, PA-C  tiZANidine (ZANAFLEX) 2 MG tablet Take 1 tablet (2 mg total) by mouth every 8 (eight) hours as needed for muscle spasms. Patient not taking: Reported on 03/07/2020 11/17/19   Lurline Idol    Family History Family History  Problem Relation Age of Onset  . Migraines Mother   . Asthma Mother   . Cervical cancer Mother   . COPD Maternal Grandmother   . Anxiety disorder Maternal Grandmother   . Coronary artery disease Maternal Grandmother   . Colon cancer Maternal Grandmother   .  Coronary artery disease Maternal Grandfather   . Stroke Maternal Grandfather   . Tuberculosis Maternal Grandfather   . Skin cancer Maternal Grandfather   . Bipolar disorder Brother     Social History Social History   Tobacco Use  . Smoking status: Never Smoker  . Smokeless tobacco: Never Used  Vaping Use  . Vaping Use: Never used  Substance Use Topics  . Alcohol use: No  . Drug use: No     Allergies   Red dye   Review of Systems Review of Systems  Constitutional: Positive for appetite change, chills and fatigue. Negative for activity change and fever.  HENT: Positive for congestion, rhinorrhea, sinus pressure and sore throat. Negative for ear pain and trouble swallowing.   Eyes: Negative for discharge and redness.  Respiratory: Positive for cough. Negative for chest tightness and shortness of  breath.   Cardiovascular: Negative for chest pain.  Gastrointestinal: Positive for diarrhea. Negative for abdominal pain, nausea and vomiting.  Musculoskeletal: Negative for myalgias.  Skin: Negative for rash.  Neurological: Positive for headaches. Negative for dizziness and light-headedness.     Physical Exam Triage Vital Signs ED Triage Vitals [03/07/20 0921]  Enc Vitals Group     BP 106/74     Pulse Rate (!) 101     Resp 18     Temp 98.9 F (37.2 C)     Temp Source Oral     SpO2 98 %     Weight      Height      Head Circumference      Peak Flow      Pain Score 5     Pain Loc      Pain Edu?      Excl. in GC?    No data found.  Updated Vital Signs BP 106/74 (BP Location: Left Arm)   Pulse (!) 101   Temp 98.9 F (37.2 C) (Oral)   Resp 18   SpO2 98%   Visual Acuity Right Eye Distance:   Left Eye Distance:   Bilateral Distance:    Right Eye Near:   Left Eye Near:    Bilateral Near:     Physical Exam Vitals and nursing note reviewed.  Constitutional:      Appearance: She is well-developed.     Comments: No acute distress  HENT:     Head: Normocephalic and atraumatic.     Ears:     Comments: Bilateral ears without tenderness to palpation of external auricle, tragus and mastoid, EAC's without erythema or swelling, TM's with good bony landmarks and cone of light. Non erythematous.     Nose: Nose normal.     Mouth/Throat:     Comments: Oral mucosa pink and moist, no tonsillar enlargement or exudate. Posterior pharynx patent and nonerythematous, no uvula deviation or swelling. Normal phonation. Eyes:     Conjunctiva/sclera: Conjunctivae normal.  Cardiovascular:     Rate and Rhythm: Normal rate.  Pulmonary:     Effort: Pulmonary effort is normal. No respiratory distress.     Comments: Breathing comfortably at rest, CTABL, no wheezing, rales or other adventitious sounds auscultated Abdominal:     General: There is no distension.  Musculoskeletal:         General: Normal range of motion.     Cervical back: Neck supple.  Skin:    General: Skin is warm and dry.  Neurological:     Mental Status: She is alert and oriented to person, place,  and time.      UC Treatments / Results  Labs (all labs ordered are listed, but only abnormal results are displayed) Labs Reviewed  NOVEL CORONAVIRUS, NAA    EKG   Radiology No results found.  Procedures Procedures (including critical care time)  Medications Ordered in UC Medications - No data to display  Initial Impression / Assessment and Plan / UC Course  I have reviewed the triage vital signs and the nursing notes.  Pertinent labs & imaging results that were available during my care of the patient were reviewed by me and considered in my medical decision making (see chart for details).     Viral URI-symptomatic and supportive care, suspect likely viral etiology.  Covid test pending.  Rest and fluids.  Discussed strict return precautions. Patient verbalized understanding and is agreeable with plan.  Final Clinical Impressions(s) / UC Diagnoses   Final diagnoses:  Encounter for screening for COVID-19  Viral URI with cough     Discharge Instructions     Covid test pending, monitor my chart for results Ibuprofen and Tylenol as needed for fever body aches and headache Rest and fluids May use over-the-counter Imodium for diarrhea as needed Continue Zyrtec, add in Flonase nasal spray for congestion Tessalon/benzonatate every 8 hours as needed for cough Follow-up if not improving or worsening    ED Prescriptions    Medication Sig Dispense Auth. Provider   benzonatate (TESSALON) 200 MG capsule Take 1 capsule (200 mg total) by mouth 3 (three) times daily as needed for up to 7 days for cough. 28 capsule Mykah Bellomo C, PA-C   fluticasone (FLONASE) 50 MCG/ACT nasal spray Place 1-2 sprays into both nostrils daily. 16 g Malyk Girouard, [place] C, PA-C     PDMP not reviewed this  encounter.   Lew Dawes, [place] 03/07/20 [phone]

## 2020-03-08 LAB — SARS-COV-2, NAA 2 DAY TAT

## 2020-03-08 LAB — NOVEL CORONAVIRUS, NAA: SARS-CoV-2, NAA: DETECTED — AB

## 2020-09-10 IMAGING — CR LEFT FOOT - COMPLETE 3+ VIEW
3 series · 3 of 3 positions shown · non-contrast
Comparison: None.

CLINICAL DATA: Fell down steps last night. Lateral ankle and foot
pain. Initial encounter.

EXAM:
LEFT FOOT - COMPLETE 3+ VIEW

[foot ap]
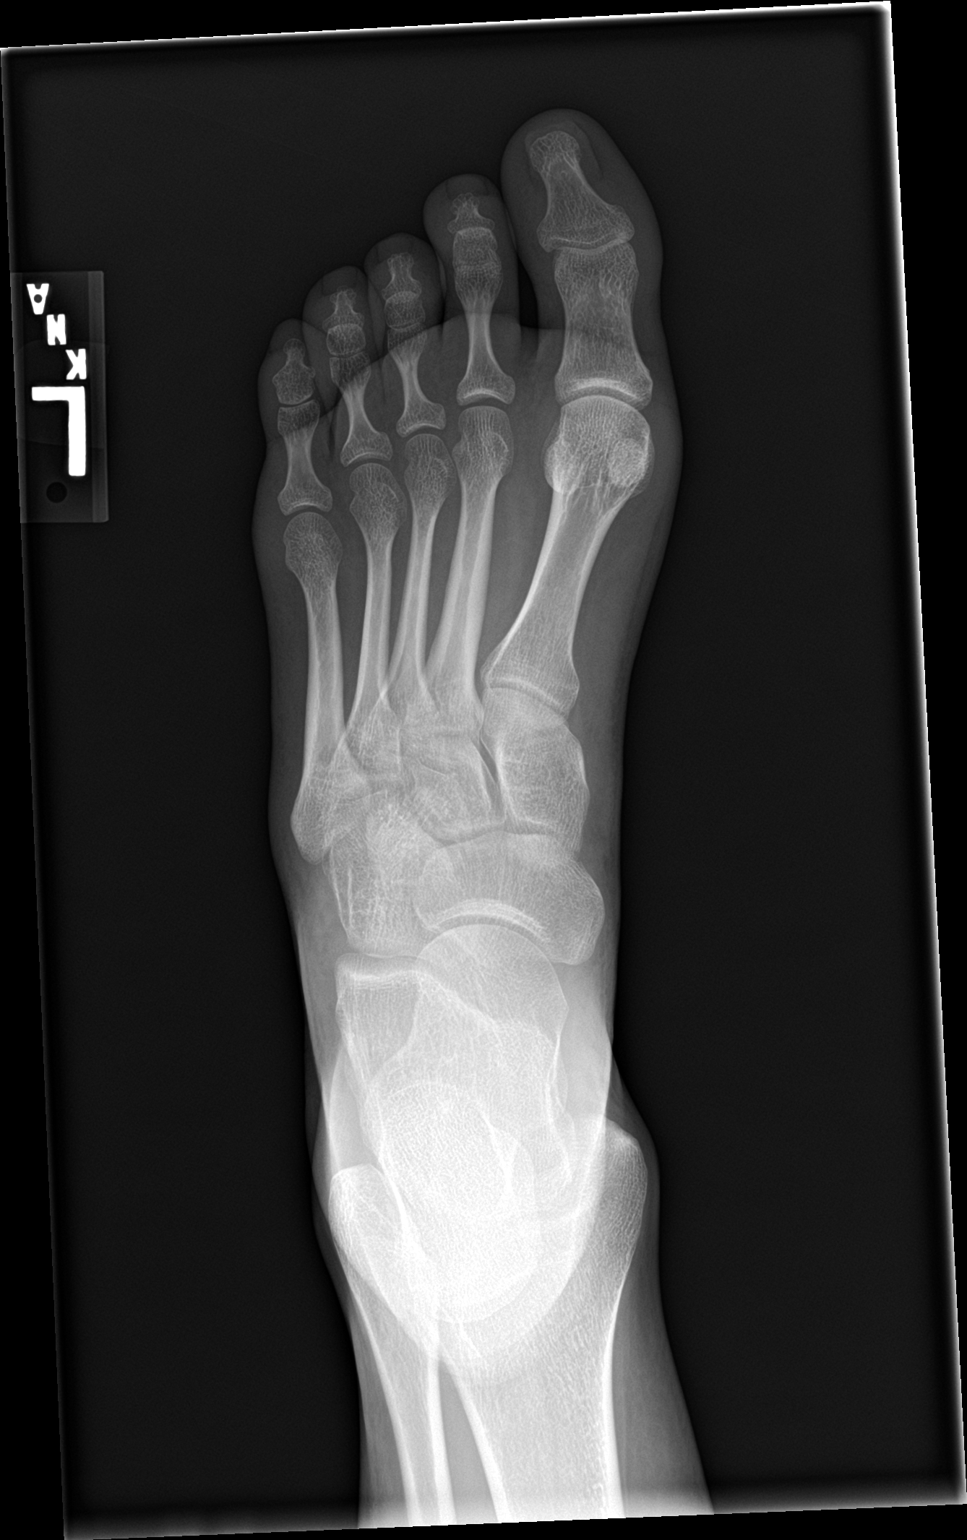

[foot obl]
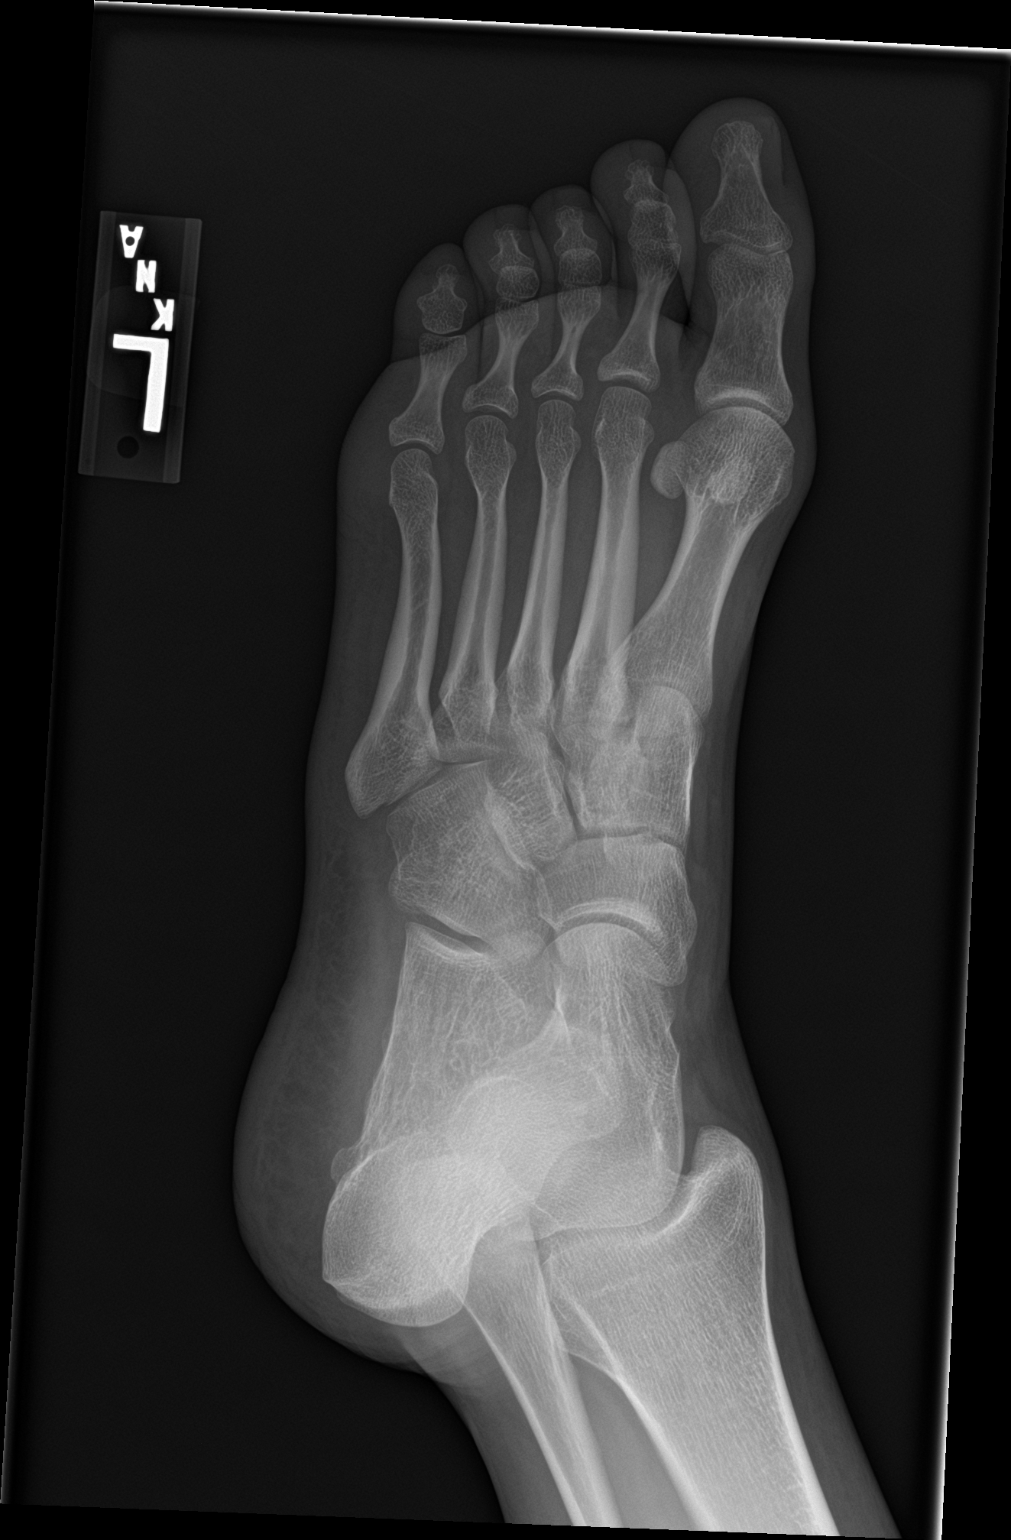

[foot lat]
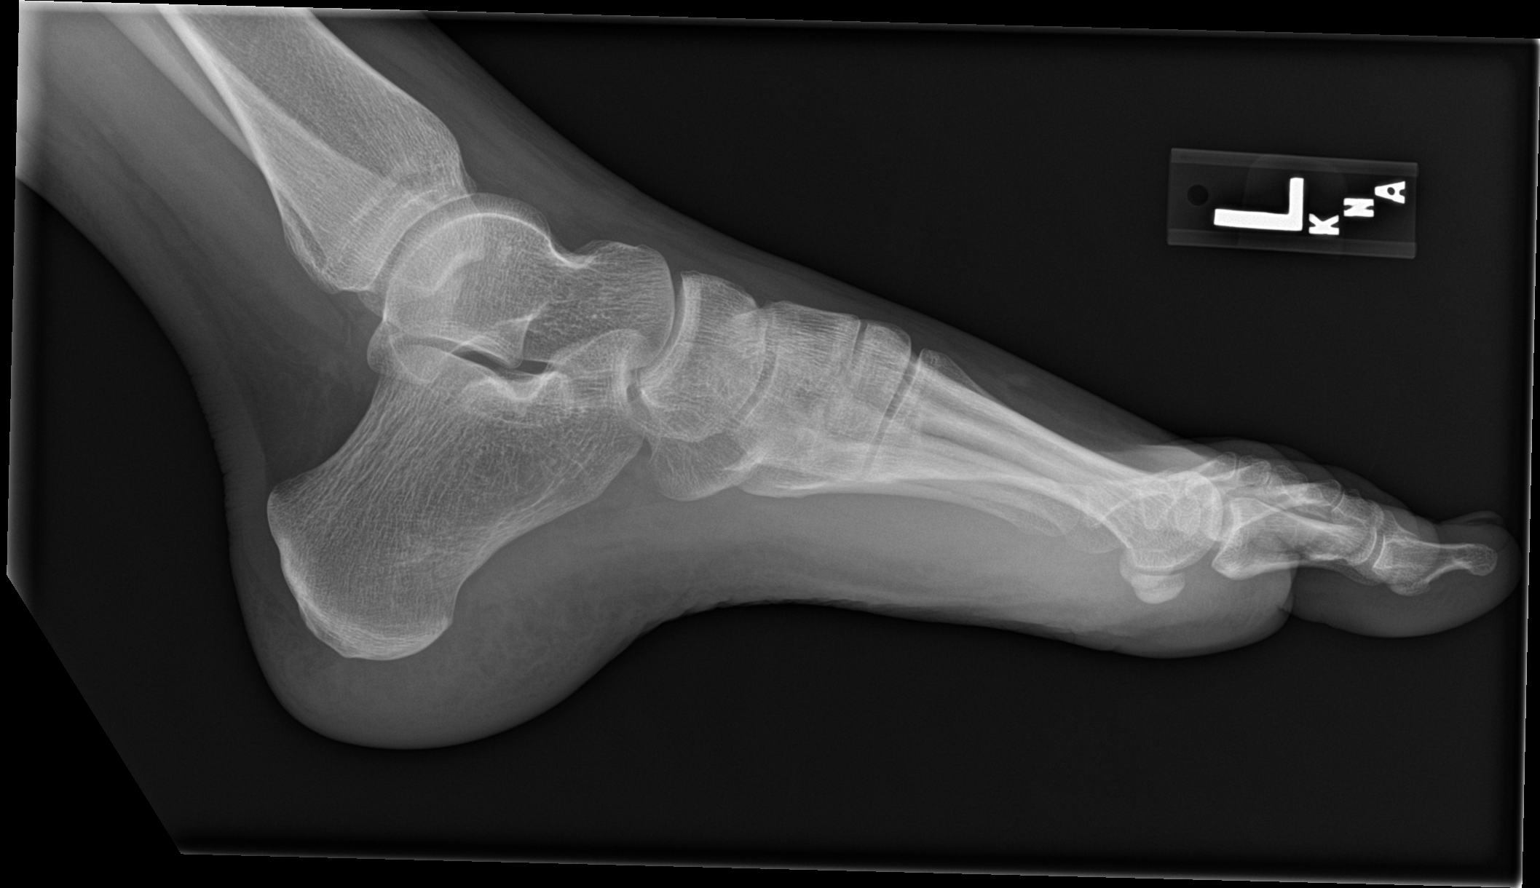

[3 of 3 positions shown; findings below may reference images not displayed]

FINDINGS: There is no evidence of fracture or dislocation. There is no
evidence of arthropathy or other focal bone abnormality. Soft
tissues are unremarkable.
IMPRESSION: Negative.

## 2020-11-05 ENCOUNTER — Other Ambulatory Visit (INDEPENDENT_AMBULATORY_CARE_PROVIDER_SITE_OTHER): Payer: BLUE CROSS/BLUE SHIELD | Admitting: *Deleted

## 2020-11-05 ENCOUNTER — Other Ambulatory Visit: Payer: Self-pay

## 2020-11-05 ENCOUNTER — Encounter: Payer: Self-pay | Admitting: *Deleted

## 2020-11-05 ENCOUNTER — Inpatient Hospital Stay (HOSPITAL_COMMUNITY): Admit: 2020-11-05 | Payer: BLUE CROSS/BLUE SHIELD

## 2020-11-05 DIAGNOSIS — N898 Other specified noninflammatory disorders of vagina: Secondary | ICD-10-CM

## 2020-11-05 NOTE — Progress Notes (Addendum)
   NURSE VISIT- VAGINITIS/STD SUBJECTIVE:  Kimberly Fields is a 30 y.o. [id number] GYN patientfemale here for a vaginal swab for vaginitis screening, STD screen.  She reports the following symptoms: vaginal odor and discharge for 2 weeks. Denies abnormal vaginal bleeding, significant pelvic pain, fever, or UTI symptoms.  OBJECTIVE:  There were no vitals taken for this visit.  Appears well, in no apparent distress  ASSESSMENT: Vaginal swab for vaginitis screening & STD screen  PLAN: Self-collected vaginal probe for Gonorrhea, Chlamydia, Trichomonas, Bacterial Vaginosis, Yeast sent to lab Treatment: to be determined once results are received Follow-up as needed if symptoms persist/worsen, or new symptoms develop  Malachy Mood  11/05/2020 3:54 PM   Chart reviewed for nurse visit. Agree with plan of care.  Arabella Merles, CNM 11/05/2020 10:56 PM

## 2020-11-07 LAB — CERVICOVAGINAL ANCILLARY ONLY
Bacterial Vaginitis (gardnerella): POSITIVE — AB
Candida Glabrata: NEGATIVE
Candida Vaginitis: NEGATIVE
Chlamydia: NEGATIVE
Comment: NEGATIVE
Comment: NEGATIVE
Comment: NEGATIVE
Comment: NEGATIVE
Comment: NEGATIVE
Comment: NORMAL
Neisseria Gonorrhea: NEGATIVE
Trichomonas: NEGATIVE

## 2020-11-10 ENCOUNTER — Telehealth: Payer: Self-pay | Admitting: *Deleted

## 2020-11-12 ENCOUNTER — Other Ambulatory Visit: Payer: Self-pay | Admitting: Advanced Practice Midwife

## 2020-11-12 MED ORDER — METRONIDAZOLE 500 MG PO TABS: 500.0000 mg | ORAL_TABLET | Freq: Two times a day (BID) | ORAL | 0 refills | Status: DC

## 2020-11-17 ENCOUNTER — Other Ambulatory Visit: Payer: Self-pay

## 2020-11-17 ENCOUNTER — Ambulatory Visit
Admission: EM | Admit: 2020-11-17 | Discharge: 2020-11-17 | Disposition: A | Discharge status: Home / Self Care | Payer: Managed Care, Other (non HMO) | Attending: Student | Admitting: Student

## 2020-11-17 DIAGNOSIS — J069 Acute upper respiratory infection, unspecified: Secondary | ICD-10-CM

## 2020-11-17 DIAGNOSIS — H65191 Other acute nonsuppurative otitis media, right ear: Secondary | ICD-10-CM | POA: Diagnosis not present

## 2020-11-17 DIAGNOSIS — J4520 Mild intermittent asthma, uncomplicated: Secondary | ICD-10-CM

## 2020-11-17 DIAGNOSIS — Z1152 Encounter for screening for COVID-19: Secondary | ICD-10-CM

## 2020-11-17 MED ORDER — PREDNISONE 20 MG PO TABS: 20.0000 mg | ORAL_TABLET | Freq: Every day | ORAL | 0 refills | Status: AC

## 2020-11-17 MED ORDER — PROMETHAZINE-DM 6.25-15 MG/5ML PO SYRP: 5.0000 mL | ORAL_SOLUTION | Freq: Four times a day (QID) | ORAL | 0 refills | Status: AC | PRN

## 2020-11-17 NOTE — ED Provider Notes (Signed)
[name]    CSN: [id number] Arrival date & time: 11/17/20  0919      History   Chief Complaint Chief Complaint  Patient presents with   Otalgia    Right ear pain    Generalized Body Aches   Headache    HPI Kimberly Fields is a 30 y.o. female presenting with R otalgia, generalized body aches, and headcdaches. Medical history UTI, asthma, IUD contraception.  Symptoms x2 days. Mucinex and ibuprofen providing some relief. Hasn't required her albuterol inhaler. Denies shortness of breath. Temperature as high as 102F, reduced by tylenol. Generalized crampy abdominal pain. Nonproductive cough. Denies fevers/chills, n/v/d, shortness of breath, chest pain, facial pain, teeth pain, headaches, sore throat, loss of taste/smell, swollen lymph nodes, ear pain.    HPI  Past Medical History:  Diagnosis Date   Anal fissure    Anemia    Asthma    UTI (lower urinary tract infection)     Patient Active Problem List   Diagnosis Date Noted   Vaginal odor 05/09/2019   Bacterial vaginosis 05/09/2019   Vaginal discharge 05/09/2019   IUD (intrauterine device) in place 05/09/2019   Bleeding internal hemorrhoids 02/11/2016   Encounter for IUD insertion 02/03/2016   Iron deficiency anemia 11/06/2015   Anal fissure 11/12/2014    Past Surgical History:  Procedure Laterality Date   iud placement  02/03/2016   TONSILLECTOMY      OB History     Gravida  5   Para  2   Term  2   Preterm  0   AB  3   Living  2      SAB  2   IAB  1   Ectopic  0   Multiple  0   Live Births  2            Home Medications    Prior to Admission medications   Medication Sig Start Date End Date Taking? Authorizing Provider  predniSONE (DELTASONE) 20 MG tablet Take 1 tablet (20 mg total) by mouth daily for 5 days. 11/17/20 11/22/20 Yes Rhys Martini, PA-C  promethazine-dextromethorphan (PROMETHAZINE-DM) 6.25-15 MG/5ML syrup Take 5 mLs by mouth 4 (four) times daily as needed  for cough. 11/17/20  Yes Rhys Martini, PA-C  albuterol (VENTOLIN HFA) 108 (90 Base) MCG/ACT inhaler Inhale into the lungs every 6 (six) hours as needed for wheezing or shortness of breath.    [provider]  cetirizine (ZYRTEC) 10 MG tablet Take 10 mg by mouth daily.    [provider]  levonorgestrel (MIRENA) 20 MCG/24HR IUD 1 each by Intrauterine route once.    [provider]  metroNIDAZOLE (FLAGYL) 500 MG tablet Take 1 tablet (500 mg total) by mouth 2 (two) times daily. 11/12/20   Arabella Merles, CNM  ORACEA 40 MG capsule Take by mouth. 08/21/20   [provider]    Family History Family History  Problem Relation Age of Onset   Migraines Mother    Asthma Mother    Cervical cancer Mother    COPD Maternal Grandmother    Anxiety disorder Maternal Grandmother    Coronary artery disease Maternal Grandmother    Colon cancer Maternal Grandmother    Coronary artery disease Maternal Grandfather    Stroke Maternal Grandfather    Tuberculosis Maternal Grandfather    Skin cancer Maternal Grandfather    Bipolar disorder Brother     Social History Social History   Tobacco Use  Smoking status: Never   Smokeless tobacco: Never  Vaping Use   Vaping Use: Never used  Substance Use Topics   Alcohol use: No   Drug use: No     Allergies   Red dye   Review of Systems Review of Systems  Constitutional:  Positive for chills and fever. Negative for appetite change.  HENT:  Positive for congestion. Negative for ear pain, rhinorrhea, sinus pressure, sinus pain and sore throat.   Eyes:  Negative for redness and visual disturbance.  Respiratory:  Positive for cough. Negative for chest tightness, shortness of breath and wheezing.   Cardiovascular:  Negative for chest pain and palpitations.  Gastrointestinal:  Positive for abdominal pain. Negative for constipation, diarrhea, nausea and vomiting.  Genitourinary:  Negative for dysuria, frequency and  urgency.  Musculoskeletal:  Negative for myalgias.  Neurological:  Negative for dizziness, weakness and headaches.  Psychiatric/Behavioral:  Negative for confusion.   All other systems reviewed and are negative.   Physical Exam Triage Vital Signs ED Triage Vitals [11/17/20 0951]  Enc Vitals Group     BP 109/70     Pulse Rate 79     Resp 16     Temp 98.4 F (36.9 C)     Temp Source Oral     SpO2 98 %     Weight      Height      Head Circumference      Peak Flow      Pain Score      Pain Loc      Pain Edu?      Excl. in GC?    No data found.  Updated Vital Signs BP 109/70 (BP Location: Left Arm)   Pulse 79   Temp 98.4 F (36.9 C) (Oral)   Resp 16   SpO2 98%   Visual Acuity Right Eye Distance:   Left Eye Distance:   Bilateral Distance:    Right Eye Near:   Left Eye Near:    Bilateral Near:     Physical Exam Vitals reviewed.  Constitutional:      General: She is not in acute distress.    Appearance: Normal appearance. She is not ill-appearing.  HENT:     Head: Normocephalic and atraumatic.     Right Ear: Hearing, ear canal and external ear normal. No swelling or tenderness. A middle ear effusion is present. There is no impacted cerumen. No mastoid tenderness. Tympanic membrane is not perforated, erythematous, retracted or bulging.     Left Ear: Hearing, tympanic membrane, ear canal and external ear normal. No swelling or tenderness. There is no impacted cerumen. No mastoid tenderness. Tympanic membrane is not perforated, erythematous, retracted or bulging.     Nose:     Right Sinus: No maxillary sinus tenderness or frontal sinus tenderness.     Left Sinus: No maxillary sinus tenderness or frontal sinus tenderness.     Mouth/Throat:     Mouth: Mucous membranes are moist.     Pharynx: Uvula midline. No oropharyngeal exudate or posterior oropharyngeal erythema.     Tonsils: No tonsillar exudate.  Cardiovascular:     Rate and Rhythm: Normal rate and regular  rhythm.     Heart sounds: Normal heart sounds.  Pulmonary:     Breath sounds: Normal breath sounds and air entry. No wheezing, rhonchi or rales.  Chest:     Chest wall: No tenderness.  Abdominal:     General: Abdomen is flat. Bowel sounds are  normal.     Tenderness: There is no abdominal tenderness. There is no guarding or rebound.  Lymphadenopathy:     Cervical: No cervical adenopathy.  Neurological:     General: No focal deficit present.     Mental Status: She is alert and oriented to person, place, and time.  Psychiatric:        Attention and Perception: Attention and perception normal.        Mood and Affect: Mood and affect normal.        Behavior: Behavior normal. Behavior is cooperative.        Thought Content: Thought content normal.        Judgment: Judgment normal.     UC Treatments / Results  Labs (all labs ordered are listed, but only abnormal results are displayed) Labs Reviewed  NOVEL CORONAVIRUS, NAA  COVID-19, FLU A+B AND RSV    EKG   Radiology No results found.  Procedures Procedures (including critical care time)  Medications Ordered in UC Medications - No data to display  Initial Impression / Assessment and Plan / UC Course  I have reviewed the triage vital signs and the nursing notes.  Pertinent labs & imaging results that were available during my care of the patient were reviewed by me and considered in my medical decision making (see chart for details).     This patient is a 30 year old female presenting with viral URI with cough. Today this pt is afebrile nontachycardic nontachypneic, oxygenating well on room air, no wheezes rhonchi or rales.  She is an asthmatic but denies shortness of breath, is not currently requiring her albuterol inhaler.  Prednisone and promethazine sent as below.  Continue over-the-counter medications for additional relief.  ED return precautions discussed.  Final Clinical Impressions(s) / UC Diagnoses   Final  diagnoses:  Viral URI  Acute middle ear effusion, right  Mild intermittent asthma without complication  Encounter for screening for COVID-19     Discharge Instructions      -Promethazine DM cough syrup for congestion/cough. This could make you drowsy, so take at night before bed. -Prednisone one pill taken in the morning for 5 days in a row. This will help with your facial pressure and ear pain.  -Continue over the counter medications during the day like Mucinex, ibuprofen/tylenol -Seek additional medical attention if you develop new symptoms like shortness of breath, weakness, dizziness     ED Prescriptions     Medication Sig Dispense Auth. Provider   predniSONE (DELTASONE) 20 MG tablet Take 1 tablet (20 mg total) by mouth daily for 5 days. 5 tablet Rhys Martini, PA-C   promethazine-dextromethorphan (PROMETHAZINE-DM) 6.25-15 MG/5ML syrup Take 5 mLs by mouth 4 (four) times daily as needed for cough. 118 mL Rhys Martini, PA-C      PDMP not reviewed this encounter.   Rhys Martini, PA-C 11/17/20 1059

## 2020-11-17 NOTE — Discharge Instructions (Addendum)
-  Promethazine DM cough syrup for congestion/cough. This could make you drowsy, so take at night before bed. -Prednisone one pill taken in the morning for 5 days in a row. This will help with your facial pressure and ear pain.  -Continue over the counter medications during the day like Mucinex, ibuprofen/tylenol -Seek additional medical attention if you develop new symptoms like shortness of breath, weakness, dizziness

## 2020-11-17 NOTE — ED Triage Notes (Addendum)
Patient presents to Urgent Care with complaints of right ear pain, stuffy nose, body aches, and right sided facial pressure x 2 days ago. Treating OTC cold/meds.   Denies fever at this time.

## 2020-11-19 LAB — COVID-19, FLU A+B AND RSV
Influenza A, NAA: NOT DETECTED
Influenza B, NAA: NOT DETECTED
RSV, NAA: NOT DETECTED
SARS-CoV-2, NAA: NOT DETECTED

## 2020-11-27 ENCOUNTER — Other Ambulatory Visit: Payer: Self-pay | Admitting: Adult Health

## 2020-11-27 MED ORDER — METRONIDAZOLE 500 MG PO TABS: 500.0000 mg | ORAL_TABLET | Freq: Two times a day (BID) | ORAL | 0 refills | Status: AC

## 2020-11-27 NOTE — Progress Notes (Signed)
Refilled flagyl
